# Patient Record
Sex: Male | Born: 1955 | Race: Black or African American | Hispanic: No | Smoking: Former smoker
Health system: Southern US, Community
[De-identification: ages and names within clinical notes are randomized; demographics above are authoritative.]

## PROBLEM LIST (undated history)

## (undated) DIAGNOSIS — I452 Bifascicular block: Secondary | ICD-10-CM

## (undated) DIAGNOSIS — I872 Venous insufficiency (chronic) (peripheral): Secondary | ICD-10-CM

## (undated) DIAGNOSIS — E876 Hypokalemia: Secondary | ICD-10-CM

## (undated) DIAGNOSIS — I10 Essential (primary) hypertension: Secondary | ICD-10-CM

## (undated) DIAGNOSIS — Z9989 Dependence on other enabling machines and devices: Secondary | ICD-10-CM

## (undated) DIAGNOSIS — E669 Obesity, unspecified: Secondary | ICD-10-CM

## (undated) DIAGNOSIS — E785 Hyperlipidemia, unspecified: Secondary | ICD-10-CM

## (undated) DIAGNOSIS — I319 Disease of pericardium, unspecified: Secondary | ICD-10-CM

## (undated) DIAGNOSIS — M21619 Bunion of unspecified foot: Secondary | ICD-10-CM

## (undated) DIAGNOSIS — G4733 Obstructive sleep apnea (adult) (pediatric): Secondary | ICD-10-CM

## (undated) DIAGNOSIS — Z87448 Personal history of other diseases of urinary system: Secondary | ICD-10-CM

## (undated) DIAGNOSIS — N289 Disorder of kidney and ureter, unspecified: Secondary | ICD-10-CM

## (undated) DIAGNOSIS — Z720 Tobacco use: Secondary | ICD-10-CM

## (undated) DIAGNOSIS — T783XXA Angioneurotic edema, initial encounter: Secondary | ICD-10-CM

## (undated) HISTORY — DX: Dependence on other enabling machines and devices: Z99.89

## (undated) HISTORY — DX: Disorder of kidney and ureter, unspecified: N28.9

## (undated) HISTORY — DX: Angioneurotic edema, initial encounter: T78.3XXA

## (undated) HISTORY — DX: Bifascicular block: I45.2

## (undated) HISTORY — DX: Venous insufficiency (chronic) (peripheral): I87.2

## (undated) HISTORY — DX: Hypokalemia: E87.6

## (undated) HISTORY — DX: Essential (primary) hypertension: I10

## (undated) HISTORY — DX: Obesity, unspecified: E66.9

## (undated) HISTORY — DX: Hyperlipidemia, unspecified: E78.5

## (undated) HISTORY — DX: Personal history of other diseases of urinary system: Z87.448

## (undated) HISTORY — DX: Tobacco use: Z72.0

## (undated) HISTORY — DX: Bunion of unspecified foot: M21.619

## (undated) HISTORY — DX: Obstructive sleep apnea (adult) (pediatric): G47.33

## (undated) HISTORY — DX: Disease of pericardium, unspecified: I31.9

---

## 1986-04-30 DIAGNOSIS — I319 Disease of pericardium, unspecified: Secondary | ICD-10-CM

## 1986-04-30 HISTORY — DX: Disease of pericardium, unspecified: I31.9

## 1986-04-30 HISTORY — PX: OTHER SURGICAL HISTORY: SHX169

## 2003-06-07 ENCOUNTER — Encounter: Admission: RE | Admit: 2003-06-07 | Discharge: 2003-06-07 | Payer: Self-pay | Admitting: Internal Medicine

## 2003-06-22 ENCOUNTER — Encounter (INDEPENDENT_AMBULATORY_CARE_PROVIDER_SITE_OTHER): Payer: Self-pay | Admitting: Cardiology

## 2003-06-22 ENCOUNTER — Ambulatory Visit: Admission: RE | Admit: 2003-06-22 | Discharge: 2003-06-22 | Payer: Self-pay | Admitting: Internal Medicine

## 2003-07-06 ENCOUNTER — Encounter: Admission: RE | Admit: 2003-07-06 | Discharge: 2003-07-06 | Payer: Self-pay | Admitting: Internal Medicine

## 2003-07-11 ENCOUNTER — Ambulatory Visit (HOSPITAL_BASED_OUTPATIENT_CLINIC_OR_DEPARTMENT_OTHER): Admission: RE | Admit: 2003-07-11 | Discharge: 2003-07-11 | Payer: Self-pay | Admitting: Internal Medicine

## 2003-07-27 ENCOUNTER — Ambulatory Visit (HOSPITAL_BASED_OUTPATIENT_CLINIC_OR_DEPARTMENT_OTHER): Admission: RE | Admit: 2003-07-27 | Discharge: 2003-07-27 | Payer: Self-pay | Admitting: Internal Medicine

## 2003-08-13 ENCOUNTER — Encounter: Admission: RE | Admit: 2003-08-13 | Discharge: 2003-08-13 | Payer: Self-pay | Admitting: Internal Medicine

## 2003-08-16 ENCOUNTER — Encounter: Admission: RE | Admit: 2003-08-16 | Discharge: 2003-08-16 | Payer: Self-pay | Admitting: Internal Medicine

## 2003-08-18 ENCOUNTER — Encounter (INDEPENDENT_AMBULATORY_CARE_PROVIDER_SITE_OTHER): Payer: Self-pay | Admitting: Internal Medicine

## 2003-08-18 LAB — CONVERTED CEMR LAB: Microalbumin U total vol: NEGATIVE mg/L

## 2003-08-19 ENCOUNTER — Ambulatory Visit (HOSPITAL_COMMUNITY): Admission: RE | Admit: 2003-08-19 | Discharge: 2003-08-19 | Payer: Self-pay | Admitting: Internal Medicine

## 2003-08-19 ENCOUNTER — Encounter: Admission: RE | Admit: 2003-08-19 | Discharge: 2003-08-19 | Payer: Self-pay | Admitting: Internal Medicine

## 2003-08-20 ENCOUNTER — Ambulatory Visit (HOSPITAL_COMMUNITY): Admission: RE | Admit: 2003-08-20 | Discharge: 2003-08-20 | Payer: Self-pay | Admitting: Internal Medicine

## 2003-08-26 ENCOUNTER — Encounter: Admission: RE | Admit: 2003-08-26 | Discharge: 2003-08-26 | Payer: Self-pay | Admitting: Internal Medicine

## 2003-12-31 ENCOUNTER — Ambulatory Visit: Payer: Self-pay | Admitting: Internal Medicine

## 2004-11-30 ENCOUNTER — Ambulatory Visit: Payer: Self-pay | Admitting: Internal Medicine

## 2005-01-03 ENCOUNTER — Ambulatory Visit: Payer: Self-pay | Admitting: Internal Medicine

## 2005-01-05 ENCOUNTER — Ambulatory Visit: Payer: Self-pay | Admitting: Internal Medicine

## 2005-05-28 ENCOUNTER — Ambulatory Visit: Payer: Self-pay | Admitting: Internal Medicine

## 2005-05-28 ENCOUNTER — Encounter (INDEPENDENT_AMBULATORY_CARE_PROVIDER_SITE_OTHER): Payer: Self-pay | Admitting: Internal Medicine

## 2005-06-25 ENCOUNTER — Ambulatory Visit: Payer: Self-pay | Admitting: Internal Medicine

## 2005-09-27 ENCOUNTER — Ambulatory Visit: Payer: Self-pay | Admitting: Internal Medicine

## 2006-02-22 ENCOUNTER — Encounter (INDEPENDENT_AMBULATORY_CARE_PROVIDER_SITE_OTHER): Payer: Self-pay | Admitting: Internal Medicine

## 2006-02-22 DIAGNOSIS — G4733 Obstructive sleep apnea (adult) (pediatric): Secondary | ICD-10-CM | POA: Insufficient documentation

## 2006-02-22 DIAGNOSIS — I152 Hypertension secondary to endocrine disorders: Secondary | ICD-10-CM | POA: Insufficient documentation

## 2006-02-22 DIAGNOSIS — E1169 Type 2 diabetes mellitus with other specified complication: Secondary | ICD-10-CM | POA: Insufficient documentation

## 2006-02-22 DIAGNOSIS — E1159 Type 2 diabetes mellitus with other circulatory complications: Secondary | ICD-10-CM | POA: Insufficient documentation

## 2006-02-22 DIAGNOSIS — E1129 Type 2 diabetes mellitus with other diabetic kidney complication: Secondary | ICD-10-CM | POA: Insufficient documentation

## 2006-02-22 DIAGNOSIS — N183 Chronic kidney disease, stage 3 unspecified: Secondary | ICD-10-CM | POA: Insufficient documentation

## 2006-02-22 DIAGNOSIS — Z6841 Body Mass Index (BMI) 40.0 and over, adult: Secondary | ICD-10-CM

## 2006-02-22 DIAGNOSIS — M21612 Bunion of left foot: Secondary | ICD-10-CM | POA: Insufficient documentation

## 2006-02-22 DIAGNOSIS — E785 Hyperlipidemia, unspecified: Secondary | ICD-10-CM

## 2006-02-22 DIAGNOSIS — M21611 Bunion of right foot: Secondary | ICD-10-CM

## 2006-02-22 DIAGNOSIS — I4510 Unspecified right bundle-branch block: Secondary | ICD-10-CM | POA: Insufficient documentation

## 2006-02-22 DIAGNOSIS — Z87891 Personal history of nicotine dependence: Secondary | ICD-10-CM | POA: Insufficient documentation

## 2006-02-22 DIAGNOSIS — I319 Disease of pericardium, unspecified: Secondary | ICD-10-CM | POA: Insufficient documentation

## 2006-02-22 DIAGNOSIS — I10 Essential (primary) hypertension: Secondary | ICD-10-CM

## 2006-06-10 ENCOUNTER — Telehealth (INDEPENDENT_AMBULATORY_CARE_PROVIDER_SITE_OTHER): Payer: Self-pay | Admitting: Hospitalist

## 2006-06-11 ENCOUNTER — Encounter (INDEPENDENT_AMBULATORY_CARE_PROVIDER_SITE_OTHER): Payer: Self-pay | Admitting: *Deleted

## 2006-12-11 ENCOUNTER — Encounter (INDEPENDENT_AMBULATORY_CARE_PROVIDER_SITE_OTHER): Payer: Self-pay | Admitting: *Deleted

## 2006-12-11 ENCOUNTER — Ambulatory Visit: Payer: Self-pay | Admitting: Infectious Disease

## 2006-12-11 LAB — CONVERTED CEMR LAB: Hgb A1c MFr Bld: 5.9 %

## 2006-12-12 LAB — CONVERTED CEMR LAB
AST: 13 units/L (ref 0–37)
Albumin: 4.3 g/dL (ref 3.5–5.2)
Alkaline Phosphatase: 51 units/L (ref 39–117)
BUN: 17 mg/dL (ref 6–23)
Creatinine, Ser: 1.26 mg/dL (ref 0.40–1.50)
Glucose, Bld: 82 mg/dL (ref 70–99)
HDL: 42 mg/dL (ref >39)
LDL Cholesterol: 130 mg/dL — ABNORMAL HIGH (ref 0–99)
Potassium: 4.2 meq/L (ref 3.5–5.3)
Total Bilirubin: 0.6 mg/dL (ref 0.3–1.2)
Total CHOL/HDL Ratio: 5.3
Triglycerides: 247 mg/dL — ABNORMAL HIGH (ref <150)
VLDL: 49 mg/dL — ABNORMAL HIGH (ref 0–40)

## 2006-12-16 ENCOUNTER — Ambulatory Visit: Payer: Self-pay | Admitting: *Deleted

## 2006-12-17 ENCOUNTER — Encounter (INDEPENDENT_AMBULATORY_CARE_PROVIDER_SITE_OTHER): Payer: Self-pay | Admitting: *Deleted

## 2006-12-17 LAB — CONVERTED CEMR LAB
CO2: 26 meq/L (ref 19–32)
Glucose, Bld: 79 mg/dL (ref 70–99)
Potassium: 4 meq/L (ref 3.5–5.3)
Sodium: 142 meq/L (ref 135–145)

## 2007-08-11 ENCOUNTER — Telehealth: Payer: Self-pay | Admitting: *Deleted

## 2007-08-25 ENCOUNTER — Telehealth (INDEPENDENT_AMBULATORY_CARE_PROVIDER_SITE_OTHER): Payer: Self-pay | Admitting: *Deleted

## 2007-09-23 ENCOUNTER — Telehealth (INDEPENDENT_AMBULATORY_CARE_PROVIDER_SITE_OTHER): Payer: Self-pay | Admitting: *Deleted

## 2007-09-24 ENCOUNTER — Encounter (INDEPENDENT_AMBULATORY_CARE_PROVIDER_SITE_OTHER): Payer: Self-pay | Admitting: Internal Medicine

## 2007-09-24 ENCOUNTER — Ambulatory Visit: Payer: Self-pay | Admitting: Internal Medicine

## 2007-09-24 LAB — CONVERTED CEMR LAB
ALT: 13 units/L (ref 0–53)
CO2: 26 meq/L (ref 19–32)
Calcium: 10 mg/dL (ref 8.4–10.5)
Chloride: 105 meq/L (ref 96–112)
Creatinine, Ser: 1.58 mg/dL — ABNORMAL HIGH (ref 0.40–1.50)
HCT: 45.2 % (ref 39.0–52.0)
Hemoglobin: 15 g/dL (ref 13.0–17.0)
Hgb A1c MFr Bld: 6.1 %
Microalb Creat Ratio: 16.9 mg/g (ref 0.0–30.0)
Platelets: 206 10*3/uL (ref 150–400)
WBC: 7.2 10*3/uL (ref 4.0–10.5)

## 2007-10-07 ENCOUNTER — Ambulatory Visit: Payer: Self-pay | Admitting: Internal Medicine

## 2007-10-07 ENCOUNTER — Encounter (INDEPENDENT_AMBULATORY_CARE_PROVIDER_SITE_OTHER): Payer: Self-pay | Admitting: *Deleted

## 2007-10-07 LAB — CONVERTED CEMR LAB: Blood Glucose, Fingerstick: 95

## 2007-10-08 ENCOUNTER — Telehealth (INDEPENDENT_AMBULATORY_CARE_PROVIDER_SITE_OTHER): Payer: Self-pay | Admitting: *Deleted

## 2007-10-08 ENCOUNTER — Encounter (INDEPENDENT_AMBULATORY_CARE_PROVIDER_SITE_OTHER): Payer: Self-pay | Admitting: *Deleted

## 2007-10-08 LAB — CONVERTED CEMR LAB
AST: 13 units/L (ref 0–37)
Albumin: 4.1 g/dL (ref 3.5–5.2)
Alkaline Phosphatase: 45 units/L (ref 39–117)
BUN: 16 mg/dL (ref 6–23)
Creatinine, Ser: 1.38 mg/dL (ref 0.40–1.50)
HDL: 35 mg/dL — ABNORMAL LOW (ref >39)
LDL Cholesterol: 113 mg/dL — ABNORMAL HIGH (ref 0–99)
Potassium: 4.1 meq/L (ref 3.5–5.3)
Total Bilirubin: 0.5 mg/dL (ref 0.3–1.2)
Total CHOL/HDL Ratio: 5.2
VLDL: 33 mg/dL (ref 0–40)

## 2007-11-20 ENCOUNTER — Encounter (INDEPENDENT_AMBULATORY_CARE_PROVIDER_SITE_OTHER): Payer: Self-pay | Admitting: Internal Medicine

## 2007-11-20 ENCOUNTER — Ambulatory Visit: Payer: Self-pay | Admitting: Internal Medicine

## 2007-11-20 DIAGNOSIS — Z87448 Personal history of other diseases of urinary system: Secondary | ICD-10-CM

## 2007-11-20 HISTORY — DX: Personal history of other diseases of urinary system: Z87.448

## 2007-11-20 LAB — CONVERTED CEMR LAB
Alkaline Phosphatase: 50 units/L (ref 39–117)
Basophils Absolute: 0 10*3/uL (ref 0.0–0.1)
Basophils Relative: 0 % (ref 0–1)
Bilirubin Urine: NEGATIVE
Creatinine, Ser: 1.44 mg/dL (ref 0.40–1.50)
Glucose, Bld: 86 mg/dL (ref 70–99)
Leukocytes, UA: NEGATIVE
Lymphocytes Relative: 23 % (ref 12–46)
MCHC: 32.2 g/dL (ref 30.0–36.0)
Neutro Abs: 5.3 10*3/uL (ref 1.7–7.7)
Nitrite: NEGATIVE
Platelets: 193 10*3/uL (ref 150–400)
Protein, U semiquant: NEGATIVE
Protein, ur: NEGATIVE mg/dL
Prothrombin Time: 12.4 s (ref 11.6–15.2)
RDW: 14.4 % (ref 11.5–15.5)
Sodium: 141 meq/L (ref 135–145)
Total Bilirubin: 0.8 mg/dL (ref 0.3–1.2)
Total Protein: 6.7 g/dL (ref 6.0–8.3)
Urine Glucose: NEGATIVE mg/dL
Urobilinogen, UA: 0.2
WBC Urine, dipstick: NEGATIVE
aPTT: 30 s (ref 24–37)
pH: 7.5
pH: 7.5 (ref 5.0–8.0)

## 2007-11-21 ENCOUNTER — Ambulatory Visit (HOSPITAL_COMMUNITY): Admission: RE | Admit: 2007-11-21 | Discharge: 2007-11-21 | Payer: Self-pay | Admitting: Internal Medicine

## 2007-11-24 ENCOUNTER — Ambulatory Visit: Payer: Self-pay | Admitting: Internal Medicine

## 2008-10-18 ENCOUNTER — Telehealth (INDEPENDENT_AMBULATORY_CARE_PROVIDER_SITE_OTHER): Payer: Self-pay | Admitting: *Deleted

## 2008-12-01 ENCOUNTER — Ambulatory Visit: Payer: Self-pay | Admitting: Internal Medicine

## 2008-12-01 ENCOUNTER — Encounter: Payer: Self-pay | Admitting: Internal Medicine

## 2008-12-01 DIAGNOSIS — J309 Allergic rhinitis, unspecified: Secondary | ICD-10-CM | POA: Insufficient documentation

## 2008-12-01 LAB — CONVERTED CEMR LAB: Hgb A1c MFr Bld: 6.1 %

## 2008-12-02 ENCOUNTER — Encounter: Payer: Self-pay | Admitting: Internal Medicine

## 2008-12-02 LAB — CONVERTED CEMR LAB
ALT: 12 units/L (ref 0–53)
Bilirubin, Direct: 0.1 mg/dL (ref 0.0–0.3)
Cholesterol: 186 mg/dL (ref 0–200)
Indirect Bilirubin: 0.4 mg/dL (ref 0.0–0.9)
LDL Cholesterol: 119 mg/dL — ABNORMAL HIGH (ref 0–99)
Total Bilirubin: 0.5 mg/dL (ref 0.3–1.2)
Total CHOL/HDL Ratio: 5.3
VLDL: 32 mg/dL (ref 0–40)

## 2009-06-29 ENCOUNTER — Observation Stay (HOSPITAL_COMMUNITY): Admission: EM | Admit: 2009-06-29 | Discharge: 2009-06-29 | Payer: Self-pay | Admitting: Emergency Medicine

## 2009-06-29 ENCOUNTER — Telehealth (INDEPENDENT_AMBULATORY_CARE_PROVIDER_SITE_OTHER): Payer: Self-pay | Admitting: Internal Medicine

## 2009-06-30 ENCOUNTER — Ambulatory Visit: Payer: Self-pay | Admitting: Internal Medicine

## 2009-06-30 DIAGNOSIS — E876 Hypokalemia: Secondary | ICD-10-CM | POA: Insufficient documentation

## 2009-06-30 DIAGNOSIS — T783XXA Angioneurotic edema, initial encounter: Secondary | ICD-10-CM | POA: Insufficient documentation

## 2009-06-30 LAB — CONVERTED CEMR LAB
Blood Glucose, Fingerstick: 118
Hgb A1c MFr Bld: 6.6 %

## 2010-01-16 ENCOUNTER — Telehealth: Payer: Self-pay | Admitting: Internal Medicine

## 2010-01-27 ENCOUNTER — Encounter (INDEPENDENT_AMBULATORY_CARE_PROVIDER_SITE_OTHER): Payer: Self-pay | Admitting: *Deleted

## 2010-02-15 ENCOUNTER — Telehealth: Payer: Self-pay | Admitting: Internal Medicine

## 2010-02-21 ENCOUNTER — Telehealth: Payer: Self-pay | Admitting: *Deleted

## 2010-04-03 ENCOUNTER — Telehealth: Payer: Self-pay | Admitting: Internal Medicine

## 2010-04-04 ENCOUNTER — Ambulatory Visit: Payer: Self-pay | Admitting: Internal Medicine

## 2010-04-04 DIAGNOSIS — I872 Venous insufficiency (chronic) (peripheral): Secondary | ICD-10-CM | POA: Insufficient documentation

## 2010-04-04 LAB — CONVERTED CEMR LAB
AST: 12 units/L (ref 0–37)
BUN: 16 mg/dL (ref 6–23)
Bilirubin, Direct: 0.1 mg/dL (ref 0.0–0.3)
Blood Glucose, Fingerstick: 110
CO2: 30 meq/L (ref 19–32)
Calcium: 9.5 mg/dL (ref 8.4–10.5)
Cholesterol: 187 mg/dL (ref 0–200)
Glucose, Bld: 100 mg/dL — ABNORMAL HIGH (ref 70–99)
Hgb A1c MFr Bld: 6.2 %
Indirect Bilirubin: 0.4 mg/dL (ref 0.0–0.9)
Microalb, Ur: 2.18 mg/dL — ABNORMAL HIGH (ref 0.00–1.89)
Sodium: 141 meq/L (ref 135–145)
Total Bilirubin: 0.5 mg/dL (ref 0.3–1.2)
Total CHOL/HDL Ratio: 5.5

## 2010-04-10 ENCOUNTER — Ambulatory Visit: Payer: Self-pay | Admitting: Internal Medicine

## 2010-04-10 LAB — CONVERTED CEMR LAB
OCCULT 1: NEGATIVE
OCCULT 2: NEGATIVE

## 2010-04-10 LAB — FECAL OCCULT BLOOD, GUAIAC: Fecal Occult Blood: NEGATIVE

## 2010-05-30 NOTE — Assessment & Plan Note (Signed)
Summary: EST-CK/FU/MEDS/CFB   Vital Signs:  Patient profile:   55 year old male Height:      72 inches (182.88 cm) Weight:      342.3 pounds (155.59 kg) BMI:     46.59 Temp:     98.5 degrees F oral Pulse rate:   76 / minute BP sitting:   125 / 83  (right arm) Cuff size:   large  Vitals Entered By: Chinita Pester RN (April 04, 2010 10:47 AM) CC: Check-up; med refills. Flu shot. Is Patient Diabetic? Yes Did you bring your meter with you today? No Pain Assessment Patient in pain? no      Nutritional Status BMI of > 30 = obese CBG Result 110  Have you ever been in a relationship where you felt threatened, hurt or afraid?No   Does patient need assistance? Functional Status Self care Ambulation Normal   Diabetic Foot Exam Foot Inspection Is there a history of a foot ulcer?              No Is there a foot ulcer now?              No Is there swelling or an abnormal foot shape?          No Are the toenails long?                No Are the toenails thick?                No Are the toenails ingrown?              No Is there heavy callous build-up?              No Is there pain in the calf muscle (Intermittent claudication) when walking?    NoIs there a claw toe deformity?              No Is there elevated skin temperature?            No Is there limited ankle dorsiflexion?            No Is there foot or ankle muscle weakness?            No  Diabetic Foot Care Education Pulse Check          Right Foot          Left Foot Posterior Tibial:        normal            normal Dorsalis Pedis:        normal            normal  High Risk Feet? No   10-g (5.07) Semmes-Weinstein Monofilament Test           Right Foot          Left Foot Visual Inspection               Test Control      normal         normal Site 1         normal         normal Site 2         normal         normal Site 3         normal         normal Site 4         normal  normal Site 5         normal          normal Site 6         normal         normal Site 7         normal         normal Site 8         normal         normal Site 9         normal         normal Site 10         normal         normal  Impression      normal         normal   Primary Care Provider:  Olene Craven MD  CC:  Check-up; med refills. Flu shot..  History of Present Illness: Patient is here for a follow up appointment: 1. Meds refills 2. DM --works as a Freight forwarder; eats on the road, no exercise. Denies any hypoglycemic events. Did not bring his glucometer with him. 3. Preventative care: did not f/u with colonoscopy due to lack of insurance.  Depression History:      The patient denies a depressed mood most of the day and a diminished interest in his usual daily activities.         Preventive Screening-Counseling & Management  Alcohol-Tobacco     Alcohol drinks/day: 1-2x a year     Smoking Status: current     Smoking Cessation Counseling: yes     Packs/Day: 1     Year Started: AT THE AGE OF 16     Passive Smoke Exposure: no  Caffeine-Diet-Exercise     Caffeine use/day: 2     Does Patient Exercise: no  Current Problems (verified): 1)  Unspecified Venous Insufficiency  (ICD-459.81) 2)  Hypokalemia  (ICD-276.8) 3)  Angioedema  (ICD-995.1) 4)  Allergic Rhinitis  (ICD-477.9) 5)  Hematuria, Hx of  (ICD-V13.09) 6)  Diabetes Mellitus, Type II  (ICD-250.00) 7)  Bunions, Bilateral  (ICD-727.1) 8)  Rbbb  (ICD-426.4) 9)  Smoker  (ICD-305.1) 10)  Sleep Apnea, Obstructive  (ICD-327.23) 11)  Obesity Nos  (ICD-278.00) 12)  Hx of Pericarditis  (ICD-423.9) 13)  Renal Insufficiency  (ICD-588.9) 14)  Hypertension  (ICD-401.9) 15)  Hyperlipidemia  (ICD-272.4)  Medications Prior to Update: 1)  Dyazide 37.5-25 Mg Caps (Triamterene-Hctz) .... Take 1 Tablet By Mouth Once A Day 2)  Aspirin 81 Mg Tbec (Aspirin) .... Take 1 Tablet By Mouth Once A Day 3)  Glucophage 500 Mg Tabs (Metformin Hcl) ....  Take 1 Tablet By Mouth Two Times A Day 4)  Pravachol 40 Mg Tabs (Pravastatin Sodium) .... Take 2  Tablets By Mouth Once A Day 5)  Atenolol 50 Mg  Tabs (Atenolol) .... Take 1 Tablet By Mouth 2x A Day 6)  Amlodipine Besylate 10 Mg  Tabs (Amlodipine Besylate) .... Take 1 Tablet By Mouth Once A Day 7)  Nasonex 50 Mcg/act Susp (Mometasone Furoate) .... 2 Sprays Inside Each Nostril Daily. 8)  Klor-Con 10 10 Meq Cr-Tabs (Potassium Chloride) .... Take Two Tab Twice A Day For Three Days and Then Take One Tab Daily 9)  Onetouch Ultra Test  Strp (Glucose Blood) .... Please Use It As Prescribed To Check Your Blood Sugar At Least Once A Day (Code 250.00)  Current Medications (verified): 1)  Dyazide 37.5-25 Mg Caps (Triamterene-Hctz) .Marland KitchenMarland KitchenMarland Kitchen  Take 1 Tablet By Mouth Once A Day 2)  Aspirin 81 Mg Tbec (Aspirin) .... Take 1 Tablet By Mouth Once A Day 3)  Glucophage 500 Mg Tabs (Metformin Hcl) .... Take 1 Tablet By Mouth Two Times A Day 4)  Pravachol 40 Mg Tabs (Pravastatin Sodium) .... Take 2  Tablets By Mouth Once A Day 5)  Atenolol 50 Mg  Tabs (Atenolol) .... Take 1 Tablet By Mouth 2x A Day 6)  Amlodipine Besylate 10 Mg  Tabs (Amlodipine Besylate) .... Take 1 Tablet By Mouth Once A Day 7)  Klor-Con 10 10 Meq Cr-Tabs (Potassium Chloride) .... Take Two Tab Twice A Day For Three Days and Then Take One Tab Daily 8)  Onetouch Ultra Test  Strp (Glucose Blood) .... Please Use It As Prescribed To Check Your Blood Sugar At Least Once A Day (Code 250.00) 9)  V-5 High Compression Hose  Misc (Elastic Bandages & Supports) .... Wear While Driving, Walking  Allergies (verified): 1)  ! Ace Inhibitors  Past History:  Past medical, surgical, family and social histories (including risk factors) reviewed, and no changes noted (except as noted below).  Past Medical History: Reviewed history from 10/07/2007 and no changes required. Right bundle branch block Pericarditis ( Pericardiocentesis)-1988 [place] DIABETES MELLITUS TYPE II     - NIDDM HTN, Stage II    - no microalbuminuria (5/09) HYPERLIPIDEMIA ABNORMAL ECG    - s/p 2D echo (06/2003): 55-65% EF, mild asymmetric septal hypertrophy, RV and LV wall thickness at upper limits of normal TOBACCO ABUSE OBESITY  Family History: Reviewed history and no changes required.  Social History: Reviewed history from 10/07/2007 and no changes required. Occupation: truck Hospital doctor.   Review of Systems  The patient denies anorexia, fever, weight loss, weight gain, vision loss, decreased hearing, hoarseness, chest pain, syncope, dyspnea on exertion, peripheral edema, prolonged cough, headaches, hemoptysis, abdominal pain, melena, hematochezia, severe indigestion/heartburn, hematuria, incontinence, genital sores, muscle weakness, suspicious skin lesions, transient blindness, difficulty walking, depression, unusual weight change, abnormal bleeding, enlarged lymph nodes, angioedema, breast masses, and testicular masses.    Physical Exam  General:  Obesealert, well-developed, well-nourished, and well-hydrated.   Head:  Normocephalic and atraumatic without obvious abnormalities. No apparent alopecia or balding. Eyes:  No corneal or conjunctival inflammation noted. EOMI. Perrla. Funduscopic exam benign, without hemorrhages, exudates or papilledema. Vision grossly normal. Ears:  External ear exam shows no significant lesions or deformities.  Otoscopic examination reveals clear canals, tympanic membranes are intact bilaterally without bulging, retraction, inflammation or discharge. Hearing is grossly normal bilaterally. Mouth:  Oral mucosa and oropharynx without lesions or exudates.  Teeth in good repair. Neck:  No deformities, masses, or tenderness noted. Lungs:  Normal respiratory effort, chest expands symmetrically. Lungs are clear to auscultation, no crackles or wheezes. Heart:  Normal rate and regular rhythm. S1 and S2 normal without gallop, murmur, click, rub or other extra  sounds. Abdomen:  Bowel sounds positive,abdomen soft and non-tender without masses, organomegaly or hernias noted. Rectal:  refused Genitalia:  refused Prostate:  refused Msk:  No deformity or scoliosis noted of thoracic or lumbar spine.   Pulses:  R and L carotid,radial,femoral,dorsalis pedis and posterior tibial pulses are full and equal bilaterally Extremities:  trace left pedal edema and trace right pedal edema.  trace left pedal edema and trace right pedal edema.   Neurologic:  No cranial nerve deficits noted. Station and gait are normal. Plantar reflexes are down-going bilaterally. DTRs are symmetrical throughout. Sensory, motor and coordinative functions  appear intact. Skin:  lfoot plantar surface  skin colored papular lesion --patient had had it since 1970's due to foot trauma. No changes in apperance, per patient. Cervical Nodes:  No lymphadenopathy noted Psych:  Cognition and judgment appear intact. Alert and cooperative with normal attention span and concentration. No apparent delusions, illusions, hallucinations  Diabetes Management Exam:    Foot Exam (with socks and/or shoes not present):       Sensory-Monofilament:          Left foot: normal          Right foot: normal   Impression & Recommendations:  Problem # 1:  DIABETES MELLITUS, TYPE II (ICD-250.00) Assessment Unchanged controlled. WEight managment discussed with the patient. His updated medication list for this problem includes:    Aspirin 81 Mg Tbec (Aspirin) .Marland Kitchen... Take 1 tablet by mouth once a day    Glucophage 500 Mg Tabs (Metformin hcl) .Marland Kitchen... Take 1 tablet by mouth two times a day  Orders: T- Capillary Blood Glucose (91478) T-Hgb A1C (in-house) (29562ZH) Ophthalmology Referral (Ophthalmology) T-Urine Microalbumin w/creat. ratio [phone])  Labs Reviewed: Creat: 1.44 (11/20/2007)   Microalbumin: Neg (08/18/2003)  Last Eye Exam: No diabetic retinopathy.   Visual acuity OD (best corrected): 20/20      Visual acuity OS (best corrected): 20/20  (10/08/2007) Reviewed HgBA1c results: 6.2 (04/04/2010)  6.6 (06/30/2009)  Problem # 2:  HYPERTENSION (ICD-401.9) Assessment: Unchanged Controlled. His updated medication list for this problem includes:    Dyazide 37.5-25 Mg Caps (Triamterene-hctz) .Marland Kitchen... Take 1 tablet by mouth once a day    Atenolol 50 Mg Tabs (Atenolol) .Marland Kitchen... Take 1 tablet by mouth 2x a day    Amlodipine Besylate 10 Mg Tabs (Amlodipine besylate) .Marland Kitchen... Take 1 tablet by mouth once a day  BP today: 125/83 Prior BP: 136/88 (06/30/2009)  Prior 10 Yr Risk Heart Disease: Not enough information (10/07/2007)  Labs Reviewed: K+: 4.2 (11/20/2007) Creat: : 1.44 (11/20/2007)   Chol: 186 (12/02/2008)   HDL: 35 (12/02/2008)   LDL: 119 (12/02/2008)   TG: 160 (12/02/2008)  Orders: T-Basic Metabolic Panel [id number])  Problem # 3:  SMOKER (ICD-305.1) Assessment: Unchanged  Encouraged smoking cessation and discussed different methods for smoking cessation.   Problem # 4:  SLEEP APNEA, OBSTRUCTIVE (ICD-327.23) Assessment: Unchanged Strongly advised to adhere with C-PAP regimen. Risks of lack of adherance discussed. Patient verbalized understanding.  Problem # 5:  UNSPECIFIED VENOUS INSUFFICIENCY (ICD-459.81) Assessment: New Instructed to decrease salt intake; eleavate LE's above heart level while supine or being seated. Compression stockings.  Medications Added to Medication List This Visit: 1)  V-5 High Compression Hose Misc (Elastic bandages & supports) .... Wear while driving, walking  Complete Medication List: 1)  Dyazide 37.5-25 Mg Caps (Triamterene-hctz) .... Take 1 tablet by mouth once a day 2)  Aspirin 81 Mg Tbec (Aspirin) .... Take 1 tablet by mouth once a day 3)  Glucophage 500 Mg Tabs (Metformin hcl) .... Take 1 tablet by mouth two times a day 4)  Pravachol 40 Mg Tabs (Pravastatin sodium) .... Take 2  tablets by mouth once a day 5)  Atenolol 50 Mg Tabs (Atenolol) ....  Take 1 tablet by mouth 2x a day 6)  Amlodipine Besylate 10 Mg Tabs (Amlodipine besylate) .... Take 1 tablet by mouth once a day 7)  Klor-con 10 10 Meq Cr-tabs (Potassium chloride) .... Take two tab twice a day for three days and then take one tab daily 8)  Onetouch Ultra Test Strp (Glucose  blood) .... Please use it as prescribed to check your blood sugar at least once a day (code 250.00) 9)  V-5 High Compression Hose Misc (Elastic bandages & supports) .... Wear while driving, walking  Other Orders: T-Hemoccult Card-Multiple (take home) ([address]) T-Lipid Profile ([id number]) T-Hepatic Function [phone])  Patient Instructions: 1)  Please, follow up with [place] in 6 months or sooner. 2)  Please, try to eact healthier. 3)  Please, use C-PAP u have a high risk of falling asleep and/or die while driving!!!!! 4)  Please, call with any questions Prescriptions: V-5 HIGH COMPRESSION HOSE  MISC (ELASTIC BANDAGES & SUPPORTS) Wear while driving, walking  #1 pair x 3   Entered and Authorized by:   Deatra Robinson MD   Signed by:   Deatra Robinson MD on 04/04/2010   Method used:   Electronically to        [facility] [address] [phone]* (retail)       [address]       [place], [place]  [address]       Ph: [phone]       Fax: [phone]   RxID:   [id number] ONETOUCH ULTRA TEST  STRP (GLUCOSE BLOOD) Please use it as prescribed to check your blood sugar at least once a day (Code 250.00)  #1 box x 11   Entered and Authorized by:   Deatra Robinson MD   Signed by:   Deatra Robinson MD on 04/04/2010   Method used:   Electronically to        [facility] [address] [phone]* (retail)       [address]       [place], [place]  [address]       Ph: [phone]       Fax: [phone]   RxID:   [id number] KLOR-CON 10 10 MEQ CR-TABS (POTASSIUM CHLORIDE) Take two tab twice a day for three days and then take one tab daily  #30 x 11   Entered and Authorized by:   Deatra Robinson MD   Signed by:    Deatra Robinson MD on 04/04/2010   Method used:   Electronically to        [facility] [phone]* (retail)       [address]       [place], [place]  [address]       Ph: [phone]       Fax: [phone]   RxID:   [id number] AMLODIPINE BESYLATE 10 MG  TABS (AMLODIPINE BESYLATE) Take 1 tablet by mouth once a day  #30 x 11   Entered and Authorized by:   Deatra Robinson MD   Signed by:   Deatra Robinson MD on 04/04/2010   Method used:   Electronically to        [facility] [phone]* (retail)       [address]       [place], [place]  [address]       Ph: [phone]       Fax: [phone]   RxID:   [id number] ATENOLOL 50 MG  TABS (ATENOLOL) Take 1 tablet by mouth 2x a day  #60 x 11   Entered and Authorized by:   Deatra Robinson MD   Signed by:   Deatra Robinson MD on 04/04/2010   Method used:   Electronically to        [facility] [phone]* (retail)       [address]  [place], [place]  [address]       Ph: [phone]       Fax: [phone]   RxID:   [id number] PRAVACHOL 40 MG TABS (PRAVASTATIN SODIUM) Take 2  tablets by mouth once a day  #60 x 11   Entered and Authorized by:   Deatra Robinson MD   Signed by:   Deatra Robinson MD on 04/04/2010   Method used:   Electronically to        [facility] [phone]* (retail)       [address]       [place], [place]  [address]       Ph: [phone]       Fax: [phone]   RxID:   [id number] GLUCOPHAGE 500 MG TABS (METFORMIN HCL) Take 1 tablet by mouth two times a day  #60 x 11   Entered and Authorized by:   Deatra Robinson MD   Signed by:   Deatra Robinson MD on 04/04/2010   Method used:   Electronically to        [facility] [phone]* (retail)       [address]       [place], [place]  [address]       Ph: [phone]       Fax: [phone]   RxID:   [id number] DYAZIDE 37.5-25 MG CAPS (TRIAMTERENE-HCTZ) Take 1 tablet by mouth once a day  #31 x 11   Entered and  Authorized by:   Deatra Robinson MD   Signed by:   Deatra Robinson MD on 04/04/2010   Method used:   Electronically to        [facility] [phone]* (retail)       [address]       [place], [place]  [address]       Ph: [phone]       Fax: [phone]   RxID:   [id number]    Orders Added: 1)  T- Capillary Blood Glucose [82948] 2)  T-Hgb A1C (in-house) [02585ID] 3)  T-Hemoccult Card-Multiple (take home) [82270] 4)  Ophthalmology Referral [Ophthalmology] 5)  Est. Patient Level IV [[address]] 6)  T-Urine Microalbumin w/creat. ratio [82043-[address]] 7)  T-Lipid Profile [80061-22930] 8)  T-Hepatic Function [80076-22960] 9)  T-Basic Metabolic Panel [id number]   Process Orders Check Orders Results:     [facility]: ABN not required for this insurance Tests Sent for requisitioning (April 04, 2010 11:09 AM):     04/04/2010: [facility] -- T-Urine Microalbumin w/creat. ratio [[id number]] (signed)     04/04/2010: [facility] -- T-Lipid Profile [phone] (signed)     04/04/2010: [facility] -- T-Hepatic Function [phone] (signed)     04/04/2010: [facility] -- T-Basic Metabolic Panel [phone] (signed)     Prevention & Chronic Care Immunizations   Influenza vaccine: Fluvax Non-MCR  (06/30/2009)   Influenza vaccine deferral: Deferred  (12/01/2008)   Influenza vaccine due: 12/30/2010    Tetanus booster: 12/01/2008: Td   Tetanus booster due: 12/02/2018    Pneumococcal vaccine: Not documented   Pneumococcal vaccine deferral: Deferred  (12/01/2008)  Colorectal Screening   Hemoccult: Not documented   Hemoccult action/deferral: Ordered  (04/04/2010)    Colonoscopy: Not documented   Colonoscopy action/deferral: GI referral  (04/04/2010)  Other Screening   PSA: Not documented   PSA action/deferral: Discussed-PSA declined  (04/04/2010)   Smoking status: current   (04/04/2010)   Smoking cessation counseling: yes  (04/04/2010)  Diabetes Mellitus   HgbA1C: 6.2  (04/04/2010)   Hemoglobin A1C due: 07/01/2010    Eye exam: No diabetic retinopathy.   Visual acuity OD (best corrected): 20/20     Visual acuity OS (best corrected): 20/20   (10/08/2007)   Diabetic eye exam action/deferral: Ophthalmology referral  (04/04/2010)   Eye exam due: 10/07/2008    Foot exam: yes  (04/04/2010)   Foot exam action/deferral: Do today   High risk foot: No  (04/04/2010)   Foot care education: Not documented    Urine microalbumin/creatinine ratio: 16.9  (09/24/2007)   Urine microalbumin action/deferral: Ordered   Urine microalbumin/cr due: 04/05/2011    Diabetes flowsheet reviewed?: Yes   Progress toward A1C goal: At goal    Stage of readiness to change (diabetes management): Maintenance  Lipids   Total Cholesterol: 186  (12/02/2008)   Lipid panel action/deferral: Lipid Panel ordered   LDL: 119  (12/02/2008)   LDL Direct: Not documented   HDL: 35  (12/02/2008)   Triglycerides: 160  (12/02/2008)   Lipid panel due: 04/05/2011    SGOT (AST): 12  (12/02/2008)   BMP action: Ordered   SGPT (ALT): 12  (12/02/2008)   Alkaline phosphatase: 54  (12/02/2008)   Total bilirubin: 0.5  (12/02/2008)   Liver panel due: 04/05/2011    Lipid flowsheet reviewed?: Yes   Progress toward LDL goal: Unchanged    Stage of readiness to change (lipid management): Action  Hypertension   Last Blood Pressure: 125 / 83  (04/04/2010)   Serum creatinine: 1.44  (11/20/2007)   BMP action: Ordered   Serum potassium 4.2  (11/20/2007)   Basic metabolic panel due: 04/05/2011    Hypertension flowsheet reviewed?: Yes   Progress toward BP goal: Unchanged    Stage of readiness to change (hypertension management): Action  Self-Management Support :   Personal Goals (by the next clinic visit) :     Personal A1C goal: 7  (06/30/2009)     Personal blood pressure goal: 130/80   (06/30/2009)     Personal LDL goal: 100  (06/30/2009)    Patient will work on the following items until the next clinic visit to reach self-care goals:     Medications and monitoring: take my medicines every day, bring all of my medications to every visit, examine my feet every day  (04/04/2010)     Eating: drink diet soda or water instead of juice or soda, eat more vegetables, use fresh or frozen vegetables, eat foods that are low in salt, eat baked foods instead of fried foods, eat fruit for snacks and desserts  (04/04/2010)     Activity: take a 30 minute walk every day  (04/04/2010)    Diabetes self-management support: Resources for patients handout  (06/30/2009)    Hypertension self-management support: Resources for patients handout  (06/30/2009)    Lipid self-management support: Resources for patients handout  (06/30/2009)    Nursing Instructions: Give Flu vaccine today Provide Hemoccult cards with instructions (see order) Refer for screening diabetic eye exam (see order) Diabetic foot exam today GI referral for screening colonoscopy (see order) Give Pneumovax today    Laboratory Results   Blood Tests   Date/Time Received: April 04, 2010 11:07 AM  Date/Time Reported: Burke Keels  April 04, 2010 11:07 AM   HGBA1C: 6.2%   (Normal Range: Non-Diabetic - 3-6%   Control Diabetic - 6-8%) CBG Random:: 110mg /dL      Appended Document: EST-CK/FU/MEDS/CFB   Pneumovax Vaccine  Vaccine Type: Pneumovax    Site: right deltoid    Mfr: Merck    Dose: 0.5 ml    Route: IM    Given by: Chinita Pester RN    Exp. Date: 08/25/2011    Lot #: [id number]    VIS given: 04/04/09 version given April 04, 2010.  Influenza Vaccine    Vaccine Type: Fluvax Non-MCR    Site: left deltoid    Mfr: GlaxoSmithKline    Dose: 0.5 ml    Route: IM    Given by: Chinita Pester RN    Exp. Date: 10/28/2010    Lot #: [id number]    VIS given: 11/22/09 version given April 04, 2010.  Flu Vaccine Consent Questions    Do you have a history of severe allergic reactions to this vaccine? no    Any prior history of allergic reactions to egg and/or gelatin? no    Do you have a sensitivity to the preservative Thimersol? no    Do you have a past history of Guillan-Barre Syndrome? no    Do you currently have an acute febrile illness? no    Have you ever had a severe reaction to latex? no    Vaccine information given and explained to patient? yes

## 2010-05-30 NOTE — Letter (Signed)
Summary: Device-Delinquent Check  [facility], Main Office  [address]   [place], [place] [address]   Phone: [phone]  Fax: [phone]     January 27, 2010 MRN: [id number]   Jeremiah Conway [address] [place], [place]  [address]   Dear Mr. Paulsen,  According to our records, you have not had your implanted device checked in the recommended period of time.  We are unable to determine appropriate device function without checking your device on a regular basis.  Please call our office to schedule an appointment as soon as possible.  If you are having your device checked by another physician, please call us so that we may update our records.  Thank you,  Letta Moynahan, EMT  January 27, 2010 10:28 AM  [facility]

## 2010-05-30 NOTE — Progress Notes (Signed)
  Pt came to ED last night with angioedema of tounge, possibly from ACEi. I was called by ED PA to get a f/u visit in a day or two. I called the clinic and got an appt for Jeremiah Conway tomorrow with Dr. Polly Cobia at 11 AM.

## 2010-05-30 NOTE — Progress Notes (Signed)
Summary: Refill/gh  Phone Note Refill Request Message from:  Fax from Pharmacy on April 03, 2010 11:39 AM  Refills Requested: Medication #1:  PRAVACHOL 40 MG TABS Take 2  tablets by mouth once a day   Last Refilled: 02/16/2010  Medication #2:  DYAZIDE 37.5-25 MG CAPS Take 1 tablet by mouth once a day   Last Refilled: 02/16/2010  Medication #3:  GLUCOPHAGE 500 MG TABS Take 1 tablet by mouth two times a day   Last Refilled: 02/16/2010  Method Requested: Electronic Initial call taken by: Angelina Ok RN,  April 03, 2010 11:40 AM  Follow-up for Phone Call        Refill approved-nurse to complete  Additional Follow-up for Phone Call Additional follow up Details #1::        Please make sure patient has an scheduled appointment as requested by Dr. Rogelia Boga; we do not have any labs recorde since 2009; she needs to be seen before having any further followups.   Thanks!!!!    Prescriptions: PRAVACHOL 40 MG TABS (PRAVASTATIN SODIUM) Take 2  tablets by mouth once a day  #60 x 1   Entered and Authorized by:   Vassie Loll MD   Signed by:   Vassie Loll MD on 04/03/2010   Method used:   Electronically to        [facility] [phone]* (retail)       [address]       [place], [place]  [address]       Ph: [phone]       Fax: [phone]   RxID:   [id number] GLUCOPHAGE 500 MG TABS (METFORMIN HCL) Take 1 tablet by mouth two times a day  #60 x 1   Entered and Authorized by:   Vassie Loll MD   Signed by:   Vassie Loll MD on 04/03/2010   Method used:   Electronically to        [facility] [phone]* (retail)       [address]       [place], [place]  [address]       Ph: [phone]       Fax: [phone]   RxID:   [id number] DYAZIDE 37.5-25 MG CAPS (TRIAMTERENE-HCTZ) Take 1 tablet by mouth once a day  #31 x 1   Entered and Authorized by:   Vassie Loll MD   Signed by:   Vassie Loll MD on 04/03/2010   Method used:   Electronically to   [facility] [phone]* (retail)       [address]       [place], [place]  [address]       Ph: [phone]       Fax: [phone]   RxID:   [id number]

## 2010-05-30 NOTE — Progress Notes (Signed)
Summary: Page #1 - med refil/gp  Phone Note Refill Request Message from:  Patient on February 15, 2010 1:30 PM  Refills Requested: Medication #1:  DYAZIDE 37.5-25 MG CAPS Take 1 tablet by mouth once a day  Medication #2:  KLOR-CON 10 10 MEQ CR-TABS Take two tab twice a day for three days and then take one tab daily  Medication #3:  GLUCOPHAGE 500 MG TABS Take 1 tablet by mouth two times a day  Medication #4:  PRAVACHOL 40 MG TABS Take 2  tablets by mouth once a day Last appt. November 16, 2009.     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   Method Requested: Electronic Initial call taken by: Chinita Pester RN,  February 15, 2010 1:31 PM  Follow-up for Phone Call        Patient was given 1 month refill and instruction to be seen for labs and followup before further refillsa re given. He has not be seen yet. I will give 1 month refill but he needs to be seen as soon as possible.    Prescriptions: KLOR-CON 10 10 MEQ CR-TABS (POTASSIUM CHLORIDE) Take two tab twice a day for three days and then take one tab daily  #30 x 1   Entered and Authorized by:   Vassie Loll MD   Signed by:   Vassie Loll MD on 02/16/2010   Method used:   Electronically to        [facility] [phone]* (retail)       [address]       [place], [place]  [address]       Ph: [phone]       Fax: [phone]   RxID:   [id number] PRAVACHOL 40 MG TABS (PRAVASTATIN SODIUM) Take 2  tablets by mouth once a day  #60 x 0   Entered and Authorized by:   Vassie Loll MD   Signed by:   Vassie Loll MD on 02/16/2010   Method used:   Electronically to        [facility] [phone]* (retail)       [address]       [place], [place]  [address]       Ph: [phone]       Fax: [phone]   RxID:   [id number] GLUCOPHAGE 500 MG TABS (METFORMIN HCL) Take 1 tablet by mouth two times a day  #60 x 0   Entered and Authorized by:   Vassie Loll MD   Signed by:   Vassie Loll MD on  02/16/2010   Method used:   Electronically to        [facility] [phone]* (retail)       [address]       [place], [place]  [address]       Ph: [phone]       Fax: [phone]   RxID:   [id number] DYAZIDE 37.5-25 MG CAPS (TRIAMTERENE-HCTZ) Take 1 tablet by mouth once a day  #31 x 0   Entered and Authorized by:   Vassie Loll MD   Signed by:   Vassie Loll MD on 02/16/2010   Method used:   Electronically to        [facility] [phone]* (retail)       [address]       [place], [place]  [address]       Ph: [phone]  Fax: [phone]   RxID:   [id number]

## 2010-05-30 NOTE — Progress Notes (Signed)
Summary: med refill/gp  Phone Note Refill Request Message from:  Patient on February 15, 2010 1:43 PM  Refills Requested: Medication #1:  ATENOLOL 50 MG  TABS Take 1 tablet by mouth 2x a day  Medication #2:  AMLODIPINE BESYLATE 10 MG  TABS Take 1 tablet by mouth once a day  Medication #3:    Method Requested: Electronic Initial call taken by: Chinita Pester RN,  February 15, 2010 1:44 PM  Follow-up for Phone Call        Last seen 3/11 for Hospital F/U and 1 month F/U requested. EMR has appt 6/11 but there is no office note. Regardless, I sent a flag to Ms Lissa Hoard to schedule an appt.  Follow-up by: Blanch Media MD,  February 16, 2010 12:18 PM    Prescriptions: AMLODIPINE BESYLATE 10 MG  TABS (AMLODIPINE BESYLATE) Take 1 tablet by mouth once a day  #30 x 1   Entered and Authorized by:   Blanch Media MD   Signed by:   Blanch Media MD on 02/16/2010   Method used:   Electronically to        [facility] [phone]* (retail)       [address]       [place], [place]  [address]       Ph: [phone]       Fax: [phone]   RxID:   [id number] ATENOLOL 50 MG  TABS (ATENOLOL) Take 1 tablet by mouth 2x a day  #60 x 1   Entered and Authorized by:   Blanch Media MD   Signed by:   Blanch Media MD on 02/16/2010   Method used:   Electronically to        [facility] [phone]* (retail)       [address]       [place], [place]  [address]       Ph: [phone]       Fax: [phone]   RxID:   [id number]

## 2010-05-30 NOTE — Assessment & Plan Note (Signed)
Summary: HFU/PER DR POKAHREL/CFB   Vital Signs:  Patient profile:   55 year old male Height:      72 inches (182.88 cm) BP supine:   136 / 88  Vitals Entered By: Theotis Barrio NT II (June 30, 2009 11:03 AM) CC: HOSPITAL FOLLOW UP APPT  / MEDICATIONS REFILL  /  MEDICAITON CHANGE DONE ED,  Is Patient Diabetic? Yes Did you bring your meter with you today? No Pain Assessment Patient in pain? no      Nutritional Status BMI of > 30 = obese CBG Result 118  Have you ever been in a relationship where you felt threatened, hurt or afraid?No   Does patient need assistance? Functional Status Self care Ambulation Normal Comments HOSPITAL FOLLOW UP APPT /  MEDICATIONS REFILL / MEDICATION CHANGE DONE BY ED   Primary Care Provider:  Olene Craven MD  CC:  HOSPITAL FOLLOW UP APPT  / MEDICATIONS REFILL  /  MEDICAITON CHANGE DONE ED and .  History of Present Illness: Jeremiah Conway is a 55 year old Male with PMH/problems as outlined in the EMR, who presents to the [facility] for follow up following a recent visit to the ED for tongue swelling. He was diagnosed as having angioedema secondary to benzepril and was sent home on prednisone. He did not take the prednisone, because he started to feel fine. His tongue swelling has susbsided today and doesn't have any new complaints. He is basically in here for review of his meds.   Depression History:      The patient denies a depressed mood most of the day and a diminished interest in his usual daily activities.         Preventive Screening-Counseling & Management  Alcohol-Tobacco     Smoking Status: current     Smoking Cessation Counseling: yes     Packs/Day: 1     Year Started: AT THE AGE OF 16     Passive Smoke Exposure: no  Caffeine-Diet-Exercise     Caffeine use/day: 2     Does Patient Exercise: no  Allergies (verified): 1)  ! Ace Inhibitors  Past History:  Past Medical History: Last updated: 10/07/2007 Right bundle branch  block Pericarditis ( Pericardiocentesis)-1988 [place] DIABETES MELLITUS TYPE II    - NIDDM HTN, Stage II    - no microalbuminuria (5/09) HYPERLIPIDEMIA ABNORMAL ECG    - s/p 2D echo (06/2003): 55-65% EF, mild asymmetric septal hypertrophy, RV and LV wall thickness at upper limits of normal TOBACCO ABUSE OBESITY  Social History: Last updated: 10/07/2007 Occupation: truck Hospital doctor.   Risk Factors: Caffeine Use: 2 (06/30/2009) Exercise: no (06/30/2009)  Risk Factors: Smoking Status: current (06/30/2009) Packs/Day: 1 (06/30/2009) Passive Smoke Exposure: no (06/30/2009)  Review of Systems       As per HPI.   Physical Exam  General:  alert and overweight-appearing.   Mouth:  tongue: no swelling pharynx pink and moist and no erythema.   Neck:  supple.   Lungs:  normal respiratory effort and no intercostal retractions.   Heart:  normal rate and regular rhythm.   Abdomen:  soft and non-tender.   Pulses:  normal peripheral pulses  Extremities:  no cyanosis, clubbing or edema  Neurologic:  non focal Psych:  normally interactive.     Impression & Recommendations:  Problem # 1:  ANGIOEDEMA (ICD-995.1) Resolved after d/c benazepril. I have listed ACEi as allergy. He should not be rechallenged with ACEi or ARB.   Problem # 2:  DIABETES MELLITUS, TYPE II (ICD-250.00) Data reviewed. A1c: 6.6 (06/30/2009 10:49:16 AM)  MICROALB/CR: 16.9 (09/24/2007 7:00:00 PM) LDL: 119 (12/02/2008 12:24:00 AM) EYE: No diabetic retinopathy.   Visual acuity OD (best corrected): 20/20     Visual acuity OS (best corrected): 20/20  (10/08/2007 8:20:55 AM) WEIGHT: 44.24 (12/01/2008 9:49:07 AM)   He will need a foot exam on next visit. No other changes today.   The following medications were removed from the medication list:    Benazepril Hcl 40 Mg Tabs (Benazepril hcl) .Marland Kitchen... Take 1 tablet by mouth once a day His updated medication list for this problem includes:    Aspirin 81 Mg Tbec (Aspirin) .Marland Kitchen...  Take 1 tablet by mouth once a day    Glucophage 500 Mg Tabs (Metformin hcl) .Marland Kitchen... Take 1 tablet by mouth two times a day  Orders: T- Capillary Blood Glucose (98119) T-Hgb A1C (in-house) (14782NF) Influenza Vaccine NON MCR ([address])  Problem # 3:  HYPERTENSION (ICD-401.9) Patient did not take his meds today, I will observe his BP without ACEi and review.  The following medications were removed from the medication list:    Benazepril Hcl 40 Mg Tabs (Benazepril hcl) .Marland Kitchen... Take 1 tablet by mouth once a day His updated medication list for this problem includes:    Dyazide 37.5-25 Mg Caps (Triamterene-hctz) .Marland Kitchen... Take 1 tablet by mouth once a day    Atenolol 50 Mg Tabs (Atenolol) .Marland Kitchen... Take 1 tablet by mouth 2x a day    Amlodipine Besylate 10 Mg Tabs (Amlodipine besylate) .Marland Kitchen... Take 1 tablet by mouth once a day  Problem # 4:  HYPOKALEMIA (ICD-276.8) K of 2.9 noted in ER yesterday. I will have him on K supplements.   Problem # 5:  HYPERLIPIDEMIA (ICD-272.4) Data reviewed. Chol: 186 (12/02/2008 12:24:00 AM)HDL:  35 (12/02/2008 12:24:00 AM)LDL:  119 (12/02/2008 12:24:00 AM)Tri:   AST:  12 (12/02/2008 12:24:00 AM) ALT:  12 (12/02/2008 12:24:00 AM)T. Bili:  0.5 (12/02/2008 12:24:00 AM) AP:  54 (12/02/2008 12:24:00 AM)  CONTINUE WITH THE CURRENT REGIMEN. HE WILL NEED A REPEAT FLP ON NEXT VISIT.  His updated medication list for this problem includes:    Pravachol 40 Mg Tabs (Pravastatin sodium) .Marland Kitchen... Take 2  tablets by mouth once a day  Complete Medication List: 1)  Dyazide 37.5-25 Mg Caps (Triamterene-hctz) .... Take 1 tablet by mouth once a day 2)  Aspirin 81 Mg Tbec (Aspirin) .... Take 1 tablet by mouth once a day 3)  Glucophage 500 Mg Tabs (Metformin hcl) .... Take 1 tablet by mouth two times a day 4)  Pravachol 40 Mg Tabs (Pravastatin sodium) .... Take 2  tablets by mouth once a day 5)  Atenolol 50 Mg Tabs (Atenolol) .... Take 1 tablet by mouth 2x a day 6)  Amlodipine Besylate 10 Mg Tabs  (Amlodipine besylate) .... Take 1 tablet by mouth once a day 7)  Nasonex 50 Mcg/act Susp (Mometasone furoate) .... 2 sprays inside each nostril daily. 8)  Klor-con 10 10 Meq Cr-tabs (Potassium chloride) .... Take two tab twice a day for three days and then take one tab daily 9)  Onetouch Ultra Test Strp (Glucose blood) .... Please use it as prescribed to check your blood sugar at least once a day (code 250.00)  Patient Instructions: 1)  Please schedule a follow-up appointment in 1 month. 2)  Please take Potassium pills as prescribed. 3)  Please stop taking benazepril.  Prescriptions: ONETOUCH ULTRA TEST  STRP (GLUCOSE BLOOD) Please use it  as prescribed to check your blood sugar at least once a day (Code 250.00)  #1 box x 5   Entered and Authorized by:   Zara Council MD   Signed by:   Zara Council MD on 06/30/2009   Method used:   Electronically to        [facility] #[id number]* (retail)       [address]       [place], [place]  [address]       Ph: [phone]       Fax: [phone]   RxID:   [id number] KLOR-CON 10 10 MEQ CR-TABS (POTASSIUM CHLORIDE) Take two tab twice a day for three days and then take one tab daily  #30 x 5   Entered and Authorized by:   Zara Council MD   Signed by:   Zara Council MD on 06/30/2009   Method used:   Electronically to        [facility] #[id number]* (retail)       [address]       [place], [place]  [address]       Ph: [phone]       Fax: [phone]   RxID:   [id number] AMLODIPINE BESYLATE 10 MG  TABS (AMLODIPINE BESYLATE) Take 1 tablet by mouth once a day  #30 x 5   Entered and Authorized by:   Zara Council MD   Signed by:   Zara Council MD on 06/30/2009   Method used:   Electronically to        [facility] #[id number]* (retail)       [address]       [place], [place]  [address]       Ph: [phone]       Fax: [phone]   RxID:   [id number] ATENOLOL 50 MG  TABS (ATENOLOL) Take 1 tablet by mouth 2x a  day  #60 x 5   Entered and Authorized by:   Zara Council MD   Signed by:   Zara Council MD on 06/30/2009   Method used:   Electronically to        [facility] #[id number]* (retail)       [address]       [place], [place]  [address]       Ph: [phone]       Fax: [phone]   RxID:   [id number] PRAVACHOL 40 MG TABS (PRAVASTATIN SODIUM) Take 2  tablets by mouth once a day  #60 x 5   Entered and Authorized by:   Zara Council MD   Signed by:   Zara Council MD on 06/30/2009   Method used:   Electronically to        [facility] [phone]* (retail)       [address]       [place], [place]  [address]       Ph: [phone]       Fax: [phone]   RxID:   [id number] GLUCOPHAGE 500 MG TABS (METFORMIN HCL) Take 1 tablet by mouth two times a day  #60 x 5   Entered and Authorized by:   Zara Council MD   Signed by:   Zara Council MD on 06/30/2009   Method used:   Electronically to        [facility] [phone]* (retail)       [address]  [place], [place]  [address]       Ph: [phone]       Fax: [phone]   RxID:   [id number] DYAZIDE 37.5-25 MG CAPS (TRIAMTERENE-HCTZ) Take 1 tablet by mouth once a day  #31 x 5   Entered and Authorized by:   Zara Council MD   Signed by:   Zara Council MD on 06/30/2009   Method used:   Electronically to        [facility] [phone]* (retail)       [address]       [place], [place]  [address]       Ph: [phone]       Fax: [phone]   RxID:   [id number]    Prevention & Chronic Care Immunizations   Influenza vaccine: Fluvax Non-MCR  (06/30/2009)   Influenza vaccine deferral: Deferred  (12/01/2008)   Influenza vaccine due: 12/29/2008    Tetanus booster: 12/01/2008: Td   Tetanus booster due: 12/02/2018    Pneumococcal vaccine: Not documented   Pneumococcal vaccine deferral: Deferred  (12/01/2008)  Colorectal Screening   Hemoccult: Not documented    Colonoscopy: Not  documented   Colonoscopy action/deferral: GI referral  (12/01/2008)  Other Screening   PSA: Not documented   PSA action/deferral: Discussed-decision deferred  (12/01/2008)   Smoking status: current  (06/30/2009)   Smoking cessation counseling: yes  (06/30/2009)  Diabetes Mellitus   HgbA1C: 6.6  (06/30/2009)    Eye exam: No diabetic retinopathy.   Visual acuity OD (best corrected): 20/20     Visual acuity OS (best corrected): 20/20   (10/08/2007)    Foot exam: Not documented   High risk foot: Not documented   Foot care education: Not documented    Urine microalbumin/creatinine ratio: 16.9  (09/24/2007)    Diabetes flowsheet reviewed?: Yes   Progress toward A1C goal: At goal  Lipids   Total Cholesterol: 186  (12/02/2008)   Lipid panel action/deferral: Lipid Panel ordered   LDL: 119  (12/02/2008)   LDL Direct: Not documented   HDL: 35  (12/02/2008)   Triglycerides: 160  (12/02/2008)    SGOT (AST): 12  (12/02/2008)   BMP action: Ordered   SGPT (ALT): 12  (12/02/2008)   Alkaline phosphatase: 54  (12/02/2008)   Total bilirubin: 0.5  (12/02/2008)    Lipid flowsheet reviewed?: Yes   Progress toward LDL goal: Unchanged  Hypertension   Last Blood Pressure: 123 / 85  (12/01/2008)   Serum creatinine: 1.44  (11/20/2007)   Serum potassium 4.2  (11/20/2007)    Hypertension flowsheet reviewed?: Yes   Progress toward BP goal: At goal  Self-Management Support :   Personal Goals (by the next clinic visit) :     Personal A1C goal: 7  (06/30/2009)     Personal blood pressure goal: 130/80  (06/30/2009)     Personal LDL goal: 100  (06/30/2009)    Patient will work on the following items until the next clinic visit to reach self-care goals:     Medications and monitoring: take my medicines every day, bring all of my medications to every visit, examine my feet every day  (06/30/2009)     Eating: drink diet soda or water instead of juice or soda, eat more vegetables, use fresh or  frozen vegetables, eat foods that are low in salt, eat baked foods instead of fried foods, limit or avoid alcohol  (06/30/2009)     Activity: take a 30 minute walk every day  (  06/30/2009)    Diabetes self-management support: Resources for patients handout  (06/30/2009)    Hypertension self-management support: Resources for patients handout  (06/30/2009)    Lipid self-management support: Resources for patients handout  (06/30/2009)        Resource handout printed.   Nursing Instructions: Give Flu vaccine today    Laboratory Results   Blood Tests   Date/Time Received: June 30, 2009 11:27 AM  Date/Time Reported: Burke Keels  June 30, 2009 11:27 AM   HGBA1C: 6.6%   (Normal Range: Non-Diabetic - 3-6%   Control Diabetic - 6-8%) CBG Random:: 118mg /dL      Immunizations Administered:  Influenza Vaccine # 1:    Vaccine Type: Fluvax Non-MCR    Site: left deltoid    Mfr: Novartis    Dose: 0.5 ml    Route: IM    Given by: Angelina Ok RN    Exp. Date: 07/2009    Lot #: [id number]    VIS given: 11/21/06 version given June 30, 2009.  Flu Vaccine Consent Questions:    Do you have a history of severe allergic reactions to this vaccine? no    Any prior history of allergic reactions to egg and/or gelatin? no    Do you have a sensitivity to the preservative Thimersol? no    Do you have a past history of Guillan-Barre Syndrome? no    Do you currently have an acute febrile illness? no    Have you ever had a severe reaction to latex? no    Vaccine information given and explained to patient? yes

## 2010-05-30 NOTE — Progress Notes (Signed)
Summary: Refill/gh  Phone Note Refill Request Message from:  Fax from Pharmacy on January 16, 2010 3:02 PM  Refills Requested: Medication #1:  DYAZIDE 37.5-25 MG CAPS Take 1 tablet by mouth once a day   Last Refilled: 09/17/2009  Medication #2:  AMLODIPINE BESYLATE 10 MG  TABS Take 1 tablet by mouth once a day   Last Refilled: 12/20/2009  Medication #3:  ATENOLOL 50 MG  TABS Take 1 tablet by mouth 2x a day   Last Refilled: 12/20/2009  Medication #4:  GLUCOPHAGE 500 MG TABS Take 1 tablet by mouth two times a day Last office visit was 06/30/2009.  Last labs were 11/2008   Method Requested: Electronic Initial call taken by: Angelina Ok RN,  January 16, 2010 3:02 PM  Follow-up for Phone Call        Needs appointment for labs given medication list. 1 month of all meds given until he can be seen. Follow-up by: Julaine Fusi  DO,  January 18, 2010 11:23 AM    Prescriptions: PRAVACHOL 40 MG TABS (PRAVASTATIN SODIUM) Take 2  tablets by mouth once a day  #60 x 0   Entered by:   Julaine Fusi  DO   Authorized by:   Vassie Loll MD   Signed by:   Julaine Fusi  DO on 01/18/2010   Method used:   Electronically to        [facility] [phone]* (retail)       [address]       [place], [place]  [address]       Ph: [phone]       Fax: [phone]   RxID:   [id number] AMLODIPINE BESYLATE 10 MG  TABS (AMLODIPINE BESYLATE) Take 1 tablet by mouth once a day  #30 x 0   Entered by:   Julaine Fusi  DO   Authorized by:   Vassie Loll MD   Signed by:   Julaine Fusi  DO on 01/18/2010   Method used:   Electronically to        [facility] [phone]* (retail)       [address]       [place], [place]  [address]       Ph: [phone]       Fax: [phone]   RxID:   [id number] ATENOLOL 50 MG  TABS (ATENOLOL) Take 1 tablet by mouth 2x a day  #60 x 0   Entered by:   Julaine Fusi  DO   Authorized by:   Vassie Loll MD   Signed by:   Julaine Fusi  DO on 01/18/2010  Method used:   Electronically to        [facility] [phone]* (retail)       [address]       [place], [place]  [address]       Ph: [phone]       Fax: [phone]   RxID:   [id number] GLUCOPHAGE 500 MG TABS (METFORMIN HCL) Take 1 tablet by mouth two times a day  #60 x 0   Entered by:   Julaine Fusi  DO   Authorized by:   Vassie Loll MD   Signed by:   Julaine Fusi  DO on 01/18/2010   Method used:   Electronically to        [facility] [phone]* (retail)       [address]       [place],  [place]  [address]       Ph: [phone]       Fax: [phone]   RxID:   [id number] WUXLKGM 37.5-25 MG CAPS (TRIAMTERENE-HCTZ) Take 1 tablet by mouth once a day  #31 x 0   Entered by:   Julaine Fusi  DO   Authorized by:   Vassie Loll MD   Signed by:   Julaine Fusi  DO on 01/18/2010   Method used:   Electronically to        [facility] [phone]* (retail)       [address]       [place], [place]  [address]       Ph: [phone]       Fax: [phone]   RxID:   [id number]

## 2010-05-30 NOTE — Progress Notes (Signed)
----   Converted from flag ---- ---- 02/21/2010 2:53 PM, Chinita Pester RN wrote: Thanks  ---- 02/21/2010 11:49 AM, Shon Hough wrote: This patient is sch with Dr. Gwenlyn Perking on 03/29/2010.  ---- 02/20/2010 9:13 AM, Chinita Pester RN wrote: Juanell Fairly, Mr. Blomquist needs an appt. as soon as possible per Dr.Butcher to cont. refills.  Thanks ------------------------------

## 2010-07-11 LAB — GLUCOSE, CAPILLARY: Glucose-Capillary: 110 mg/dL — ABNORMAL HIGH (ref 70–99)

## 2010-07-24 LAB — COMPREHENSIVE METABOLIC PANEL
ALT: 32 U/L (ref 0–53)
AST: 25 U/L (ref 0–37)
Albumin: 3.9 g/dL (ref 3.5–5.2)
Alkaline Phosphatase: 52 U/L (ref 39–117)
CO2: 27 mEq/L (ref 19–32)
Chloride: 102 mEq/L (ref 96–112)
GFR calc Af Amer: >60 mL/min (ref >60)
Potassium: 2.9 mEq/L — ABNORMAL LOW (ref 3.5–5.1)
Total Bilirubin: 0.9 mg/dL (ref 0.3–1.2)

## 2010-07-24 LAB — DIFFERENTIAL
Basophils Absolute: 0 10*3/uL (ref 0.0–0.1)
Basophils Relative: 0 % (ref 0–1)
Eosinophils Absolute: 0.1 10*3/uL (ref 0.0–0.7)
Eosinophils Relative: 1 % (ref 0–5)
Monocytes Absolute: 0.4 10*3/uL (ref 0.1–1.0)

## 2010-07-24 LAB — CBC
Platelets: 190 10*3/uL (ref 150–400)
RBC: 5.66 MIL/uL (ref 4.22–5.81)
WBC: 12.1 10*3/uL — ABNORMAL HIGH (ref 4.0–10.5)

## 2010-07-24 LAB — GLUCOSE, CAPILLARY: Glucose-Capillary: 118 mg/dL — ABNORMAL HIGH (ref 70–99)

## 2010-08-13 ENCOUNTER — Encounter: Payer: Self-pay | Admitting: Internal Medicine

## 2010-09-26 ENCOUNTER — Telehealth: Payer: Self-pay | Admitting: *Deleted

## 2010-09-26 ENCOUNTER — Encounter: Payer: Self-pay | Admitting: Internal Medicine

## 2010-09-26 NOTE — Telephone Encounter (Signed)
Pharmacy would like to change pt from dyazide to maxzide due to cost...is this acceptable? If so please change med list and send electronically.   Thank you,h.

## 2010-09-27 ENCOUNTER — Other Ambulatory Visit: Payer: Self-pay | Admitting: Internal Medicine

## 2010-09-27 DIAGNOSIS — I10 Essential (primary) hypertension: Secondary | ICD-10-CM

## 2010-09-27 MED ORDER — TRIAMTERENE-HCTZ 37.5-25 MG PO TABS: 1.0000 | ORAL_TABLET | Freq: Every day | ORAL | Status: DC

## 2011-03-01 ENCOUNTER — Encounter: Payer: Self-pay | Admitting: Internal Medicine

## 2011-04-11 ENCOUNTER — Other Ambulatory Visit: Payer: Self-pay | Admitting: Internal Medicine

## 2011-04-12 ENCOUNTER — Encounter: Payer: Self-pay | Admitting: Internal Medicine

## 2011-04-12 ENCOUNTER — Ambulatory Visit (INDEPENDENT_AMBULATORY_CARE_PROVIDER_SITE_OTHER): Payer: Self-pay | Admitting: Internal Medicine

## 2011-04-12 VITALS — BP 141/94 | HR 85 | Temp 97.1°F | Ht 72.0 in | Wt 345.1 lb

## 2011-04-12 DIAGNOSIS — I10 Essential (primary) hypertension: Secondary | ICD-10-CM

## 2011-04-12 DIAGNOSIS — Z139 Encounter for screening, unspecified: Secondary | ICD-10-CM

## 2011-04-12 DIAGNOSIS — E785 Hyperlipidemia, unspecified: Secondary | ICD-10-CM

## 2011-04-12 DIAGNOSIS — E119 Type 2 diabetes mellitus without complications: Secondary | ICD-10-CM

## 2011-04-12 DIAGNOSIS — N400 Enlarged prostate without lower urinary tract symptoms: Secondary | ICD-10-CM | POA: Insufficient documentation

## 2011-04-12 DIAGNOSIS — Z23 Encounter for immunization: Secondary | ICD-10-CM

## 2011-04-12 DIAGNOSIS — R339 Retention of urine, unspecified: Secondary | ICD-10-CM

## 2011-04-12 LAB — COMPREHENSIVE METABOLIC PANEL
ALT: 14 U/L (ref 0–53)
Albumin: 4.1 g/dL (ref 3.5–5.2)
Alkaline Phosphatase: 53 U/L (ref 39–117)
Glucose, Bld: 109 mg/dL — ABNORMAL HIGH (ref 70–99)
Potassium: 4 mEq/L (ref 3.5–5.3)
Sodium: 143 mEq/L (ref 135–145)
Total Protein: 7.3 g/dL (ref 6.0–8.3)

## 2011-04-12 LAB — LIPID PANEL
HDL: 38 mg/dL — ABNORMAL LOW (ref >39)
Triglycerides: 173 mg/dL — ABNORMAL HIGH (ref <150)

## 2011-04-12 LAB — PSA: PSA: 1.47 ng/mL (ref <=4.00)

## 2011-04-12 MED ORDER — PRAVASTATIN SODIUM 40 MG PO TABS: 40.0000 mg | ORAL_TABLET | Freq: Every day | ORAL | Status: DC

## 2011-04-12 MED ORDER — ATENOLOL 50 MG PO TABS: 50.0000 mg | ORAL_TABLET | Freq: Every day | ORAL | Status: DC

## 2011-04-12 MED ORDER — TAMSULOSIN HCL 0.4 MG PO CAPS: 0.4000 mg | ORAL_CAPSULE | Freq: Every day | ORAL | Status: DC

## 2011-04-12 MED ORDER — METFORMIN HCL 500 MG PO TABS: 500.0000 mg | ORAL_TABLET | Freq: Two times a day (BID) | ORAL | Status: DC

## 2011-04-12 MED ORDER — ASPIRIN 81 MG PO TABS: 81.0000 mg | ORAL_TABLET | Freq: Every day | ORAL | Status: AC

## 2011-04-12 MED ORDER — AMLODIPINE BESYLATE 10 MG PO TABS: 10.0000 mg | ORAL_TABLET | Freq: Every day | ORAL | Status: DC

## 2011-04-12 MED ORDER — TRIAMTERENE-HCTZ 37.5-25 MG PO TABS: 1.0000 | ORAL_TABLET | Freq: Every day | ORAL | Status: DC

## 2011-04-12 NOTE — Assessment & Plan Note (Signed)
Patient's diabetes is well controlled, we'll check an A1c, and adjusts metformin as needed. Will also check by problem creatinine ratio, lipid panel, complete metabolic panel. The patient is going for an eye exam in the upcoming month as a part of his work as a Naval architect, therefore we'll not make an ophthalmology referral.

## 2011-04-12 NOTE — Assessment & Plan Note (Signed)
Patient is due for screening colonoscopy, however he does not have insurance, we'll attempt to see if financial assistance at [facility] can help.

## 2011-04-12 NOTE — Assessment & Plan Note (Signed)
Patient describes symptoms consistent with BPH, will check a PSA today and start patient on tamsulosin, if PSA is elevated we'll have to make a referral to urology

## 2011-04-12 NOTE — Progress Notes (Signed)
Addended by: Youlanda Roys A on: 04/12/2011 10:30 AM   Modules accepted: Orders

## 2011-04-12 NOTE — Assessment & Plan Note (Signed)
Patient's blood pressure slightly elevated despite being compliant with his medications, also given his symptoms of BPH will add tamsulosin to his regiment.

## 2011-04-12 NOTE — Assessment & Plan Note (Signed)
Last LDL is within goal, will check fasting lipid today and refill prescription for pravastatin.

## 2011-04-12 NOTE — Patient Instructions (Signed)

## 2011-04-12 NOTE — Progress Notes (Signed)
  Subjective:    Patient ID: Jeremiah Conway, male    DOB: Aug 27, 1955, 55 y.o.   MRN: [id number]  HPI  Patient is a 55 year old male with a past medical history listed below, presents to the outpatient clinic for routine yearly followup, would like refills of all of his medications, is here for lab testing, reports having to wake up at night multiple times to urinate and reports straining, and other symptoms consistent with urinary retention. Reports that he has been compliant with his medications, and denies any other complaints.   Patient Active Problem List  Diagnoses  . DIABETES MELLITUS, TYPE II  . HYPERLIPIDEMIA  . HYPOKALEMIA  . OBESITY NOS  . SMOKER  . SLEEP APNEA, OBSTRUCTIVE  . HYPERTENSION  . PERICARDITIS  . RBBB  . UNSPECIFIED VENOUS INSUFFICIENCY  . ALLERGIC RHINITIS  . RENAL INSUFFICIENCY  . BUNIONS, BILATERAL  . ANGIOEDEMA  . HEMATURIA, HX OF   Current Outpatient Prescriptions on File Prior to Visit  Medication Sig Dispense Refill  . amLODipine (NORVASC) 10 MG tablet TAKE ONE TABLET BY MOUTH EVERY DAY  30 tablet  11  . aspirin 81 MG tablet Take 81 mg by mouth daily.        Marland Kitchen atenolol (TENORMIN) 50 MG tablet TAKE ONE TABLET BY MOUTH TWICE DAILY  60 tablet  11  . Elastic Bandages & Supports (V-5 HIGH COMPRESSION HOSE) MISC by Does not apply route. Wear while driving, walking       . glucose blood test strip 1 each by Other route as needed. Use as instructed       . metFORMIN (GLUCOPHAGE) 500 MG tablet Take 500 mg by mouth 2 (two) times daily with a meal.        . potassium chloride (KLOR-CON) 10 MEQ CR tablet Take 10 mEq by mouth daily.        . pravastatin (PRAVACHOL) 40 MG tablet TAKE TWO TABLETS BY MOUTH EVERY DAY  60 tablet  11  . triamterene-hydrochlorothiazide (DYAZIDE) 37.5-25 MG per capsule TAKE ONE CAPSULE BY MOUTH EVERY DAY  30 capsule  11  . triamterene-hydrochlorothiazide (MAXZIDE-25) 37.5-25 MG per tablet Take 1 tablet by mouth daily.  30 tablet  11    Allergies  Allergen Reactions  . Ace Inhibitors     REACTION: Angioedema, tongue swelling to benazepril    Review of Systems  Genitourinary: Positive for decreased urine volume.  All other systems reviewed and are negative.       Objective:   Physical Exam  Nursing note and vitals reviewed. Constitutional: He is oriented to person, place, and time. He appears well-developed and well-nourished.  HENT:  Head: Normocephalic and atraumatic.  Eyes: Pupils are equal, round, and reactive to light.  Neck: Normal range of motion. No JVD present. No thyromegaly present.  Cardiovascular: Normal rate, regular rhythm and normal heart sounds.   Pulmonary/Chest: Effort normal and breath sounds normal. He has no wheezes. He has no rales.  Abdominal: Soft. Bowel sounds are normal. There is no tenderness. There is no rebound.  Musculoskeletal: Normal range of motion. He exhibits no edema.  Neurological: He is alert and oriented to person, place, and time.  Skin: Skin is warm and dry.          Assessment & Plan:

## 2011-04-13 LAB — MICROALBUMIN / CREATININE URINE RATIO: Microalb, Ur: 7.59 mg/dL — ABNORMAL HIGH (ref 0.00–1.89)

## 2011-04-14 IMAGING — CR DG CHEST 1V PORT
1 series · 1 of 1 positions shown · non-contrast
Comparison: None.

CLINICAL DATA: Allergic reaction.  Tongue swelling.

PORTABLE CHEST - 1 VIEW

[AP]
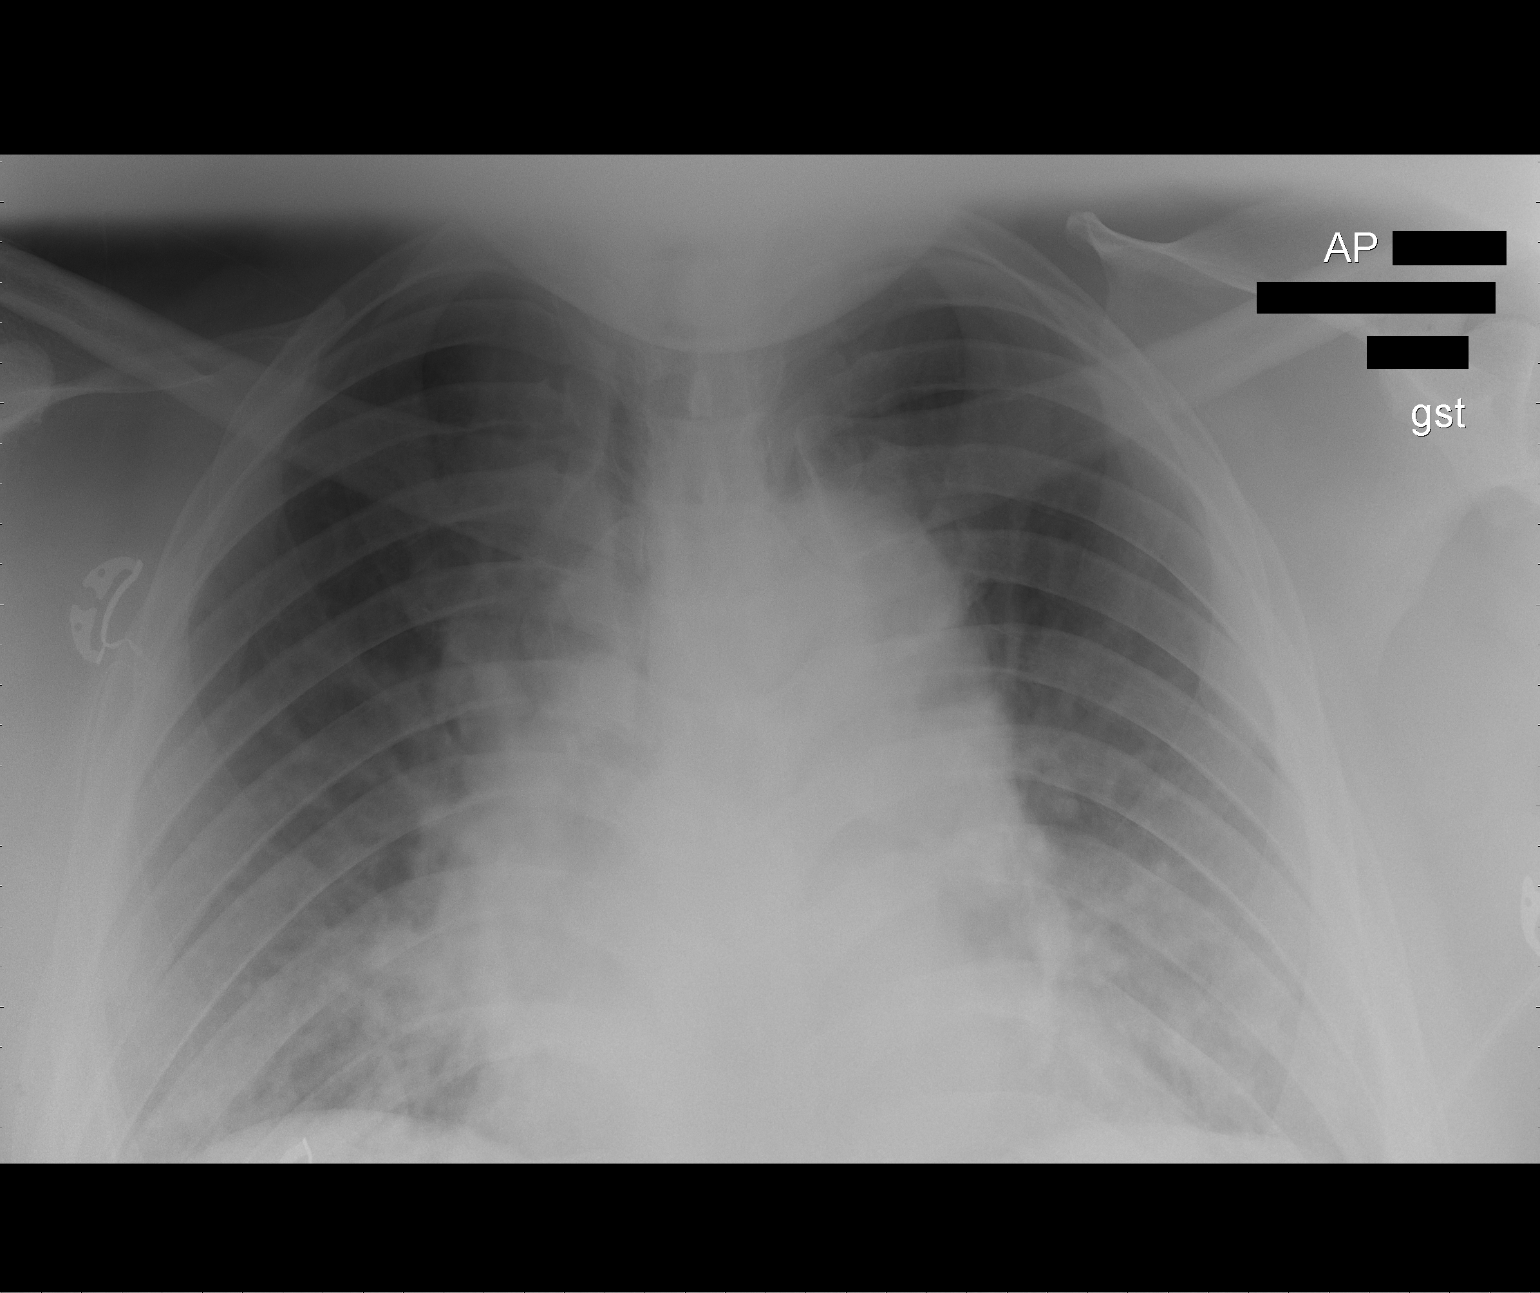

[1 of 1 positions shown; findings below may reference images not displayed]

FINDINGS: The lung volumes are low with some vascular crowding and
basilar atelectasis.  Heart size is upper normal.  No effusion.
IMPRESSION: No acute finding.

## 2011-05-31 NOTE — Progress Notes (Signed)
Addended by: Neomia Dear on: 05/31/2011 06:36 PM   Modules accepted: Orders

## 2011-10-25 ENCOUNTER — Other Ambulatory Visit: Payer: Self-pay | Admitting: *Deleted

## 2011-10-25 DIAGNOSIS — I10 Essential (primary) hypertension: Secondary | ICD-10-CM

## 2011-10-25 MED ORDER — AMLODIPINE BESYLATE 10 MG PO TABS: 10.0000 mg | ORAL_TABLET | Freq: Every day | ORAL | Status: DC

## 2011-10-31 ENCOUNTER — Encounter: Payer: Self-pay | Admitting: Dietician

## 2011-10-31 ENCOUNTER — Ambulatory Visit (INDEPENDENT_AMBULATORY_CARE_PROVIDER_SITE_OTHER): Payer: Self-pay | Admitting: Internal Medicine

## 2011-10-31 ENCOUNTER — Encounter: Payer: Self-pay | Admitting: Internal Medicine

## 2011-10-31 ENCOUNTER — Ambulatory Visit (INDEPENDENT_AMBULATORY_CARE_PROVIDER_SITE_OTHER): Payer: Self-pay | Admitting: Dietician

## 2011-10-31 VITALS — BP 133/90 | HR 78 | Temp 98.3°F | Ht 72.0 in | Wt 351.1 lb

## 2011-10-31 DIAGNOSIS — E785 Hyperlipidemia, unspecified: Secondary | ICD-10-CM

## 2011-10-31 DIAGNOSIS — E119 Type 2 diabetes mellitus without complications: Secondary | ICD-10-CM

## 2011-10-31 DIAGNOSIS — I10 Essential (primary) hypertension: Secondary | ICD-10-CM

## 2011-10-31 DIAGNOSIS — G4733 Obstructive sleep apnea (adult) (pediatric): Secondary | ICD-10-CM

## 2011-10-31 DIAGNOSIS — J309 Allergic rhinitis, unspecified: Secondary | ICD-10-CM

## 2011-10-31 DIAGNOSIS — F17200 Nicotine dependence, unspecified, uncomplicated: Secondary | ICD-10-CM

## 2011-10-31 DIAGNOSIS — R609 Edema, unspecified: Secondary | ICD-10-CM

## 2011-10-31 DIAGNOSIS — Z79899 Other long term (current) drug therapy: Secondary | ICD-10-CM

## 2011-10-31 DIAGNOSIS — I872 Venous insufficiency (chronic) (peripheral): Secondary | ICD-10-CM

## 2011-10-31 LAB — POCT GLYCOSYLATED HEMOGLOBIN (HGB A1C): Hemoglobin A1C: 6.8

## 2011-10-31 LAB — GLUCOSE, CAPILLARY: Glucose-Capillary: 113 mg/dL — ABNORMAL HIGH (ref 70–99)

## 2011-10-31 MED ORDER — V-5 HIGH COMPRESSION HOSE MISC: Status: AC

## 2011-10-31 MED ORDER — LORATADINE 10 MG PO TABS: 10.0000 mg | ORAL_TABLET | Freq: Every evening | ORAL | Status: DC | PRN

## 2011-10-31 MED ORDER — PRAVASTATIN SODIUM 80 MG PO TABS: 80.0000 mg | ORAL_TABLET | Freq: Every evening | ORAL | Status: DC

## 2011-10-31 NOTE — Patient Instructions (Addendum)
To do List:  1. Please see Jeremiah Conway about obtaining an orange card.   2. Please take [facility] to [facility] and bring a report of the CPAP machine settings and usage download to the clinic so we can fill out your work forms  3. Please use your CPAP machine every night  4. Follow up with [name], our diabetes educator  5. Follow up with your eye doctor. It is very important that you have your diabetes eye exam.  Medication changes:  Start taking Claritin for your seasonal allergy. You may take it during pollen season only.  Stop using Affrin as we discussed Start taking the increase dose of pravastatin, your cholesterol medicine. Your last LDL, or 'bad' cholesterol was 123, we would like to see it less than 100. Diet changes will also help lower your cholesterol  Instructions: Use oil/cream on your feet daily, dry well between your toes.   Do not drink water, or other liquids 1-2 hours before going to bed Please call us with any concerns

## 2011-10-31 NOTE — Patient Instructions (Addendum)
For weight loss consider the following:  1-  trying to eat lower fat:            -  [employer]: Grilled chicken  Instead of double cheese burger                           small fry or salad instead of medium fries            - 2 creams instead of 3 in coffee            -  fruit, unsalted nuts and vegetables instead of chips, sweet snacks            - regular hot dog instead of a chili cheese dog  2- Lower sugar intake            - tea instead of soda( could also drink vegetable juice- tomato or V8            - fruit, Lite yogurt instead of little Debbie snacks  3- Increase activity            - park farther away from building            - use stairs when possible instead of elevators           - get up to answer phone, change channel           - walk 5 minutes after each meal and snack  Call me anytime with questions! [name] [phone]

## 2011-10-31 NOTE — Progress Notes (Signed)
Medical Nutrition Therapy:  Appt start time: 1030 end time:  1055.  Assessment:  Primary concerns today: Weight management.  Usual eating pattern includes 2 meals and 2 snacks per day. Usual physical activity includes very little per patient.occupation is truck Horticulturist, commercial foods include coffee, fries, hot dogs, burgers.  Avoided foods include milk.   24-hr recall: B ( 10 AM)-  Medium coffee with 3 creamers and splenda, 2 chili cheese dogs  Snk (1-2 PM)Medium coffee with 3 creamers and splenda, corn chips      D ( PM)- [employer]- double cheeseburger, medium fried nad medium coke  Snk ( 11PM-12AM)- Medium coffee with 3 creamers and splenda, little Debbie cake    Progress Towards Goal(s):  In progress.   Nutritional Diagnosis:  NB-1.1 Food and nutrition-related knowledge deficit lack of prior exposure to caloric density of foods and healthier food choices as evidenced by patient questions and food choices. 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    Intervention:   1- Nutrition education about lower fat and caloric density, less processed food  Choices. 2- Calorie Brooke Dare book provided to patient with examples demonstrated back by patient showing he understands how to use it to make healthier choices.   Monitoring/Evaluation:  Dietary intake, exercise, blood sugars, and body weight prn.

## 2011-11-05 NOTE — Progress Notes (Signed)
Subjective:    Patient ID: Jeremiah Conway, male    DOB: March 14, 1956, 56 y.o.   MRN: [id number]  HPI Jeremiah Conway is a 56 yo man with PMH significant for DM2, HTN, and OSA who comes in today for a routine annual exam, Occupational Health evaluation, and acute allergic rhinitis. He continues to take his medicines as prescribed except for pravastatin as he has ran out of that prescription.  He routinely checks his blood glucose but has not brought his monitor with him today. He tells me that his before breakfast blood glucose has been in the lower 100s. His Hb A1c today is. He performs routine feet check but not daily. His last eye exam was years ago.   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 Following up on his medical care has been challenging for him because of his long assigned trips. 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 He has brought these forms in today however, I will not be able to sign his OSA compliance form given that he has not brought his CPAP machine download today.   For about 2-3 months now Jeremiah Conway has been experiencing continuous clear nasal discharge, nasal congestion, and occasional eye pruritis with increased tears. He has had these symptoms in the past, especially during the Spring and Summer months and believes it might be related to pollen allergy. For the last 2-3 weeks he has been using Affrin nasal spray with moderated improvement of his nasal congestion. He denies fever, chills, cough, headache, sinus pain, and thick nasal or eye discharge.   Of note, Jeremiah Conway has been trying to quit, his last pack of cigarettes was 4-5 days ago. He is currently using electronic cigarettes with nicotine as they are readily available for purchasing  at popular truck stops.     Review of Systems  Constitutional: Negative for fever, chills, diaphoresis, activity change, appetite change, fatigue and unexpected weight change.  HENT: Positive for congestion, rhinorrhea, sneezing and postnasal drip. Negative for ear pain, nosebleeds, facial swelling and ear discharge.   Eyes: Positive for itching. Negative for photophobia, pain, discharge, redness and visual disturbance.  Respiratory: Positive for apnea. Negative for cough, choking, chest tightness, shortness of breath and wheezing.   Cardiovascular: Positive for leg swelling. Negative for chest pain.  Genitourinary: Positive for frequency. Negative for dysuria, urgency, enuresis and difficulty urinating.  Neurological: Negative for dizziness and numbness.  Hematological: Negative for adenopathy.       Objective:   Physical Exam  Constitutional: He is oriented to person, place, and time. He appears well-developed and well-nourished. No distress.  HENT:  Head: Normocephalic and atraumatic.  Right Ear: External ear normal.  Left Ear: External ear normal.  Mouth/Throat: Oropharynx is clear and moist. No oropharyngeal exudate.       Erythematous nasal turbinates bilaterally.   Eyes: Conjunctivae are normal. Right eye exhibits no discharge. Left eye exhibits no discharge. No scleral icterus.  Neck: Neck supple.  Cardiovascular: Normal rate, normal heart sounds and intact distal pulses.   Pulmonary/Chest: Effort normal and breath sounds normal. No respiratory distress. He has no wheezes. He has no rales. He exhibits no tenderness.  Musculoskeletal: He exhibits edema.  Trace pitting edema bilaterally up to his knees  Lymphadenopathy:    He has no cervical adenopathy.  Neurological: He is alert and oriented to person, place, and time.  Skin: Skin is warm and dry. No rash noted. He is not diaphoretic. No erythema.  Psychiatric: He has a normal mood and affect. His behavior is normal.           Assessment & Plan:

## 2011-11-06 ENCOUNTER — Encounter: Payer: Self-pay | Admitting: Internal Medicine

## 2011-11-06 NOTE — Assessment & Plan Note (Signed)
Pt with allergic rhinitis during Spring and Summer months.  --Ordered Claritin 10mg  tab but at bed time to prevent drowsiness during the day --Advised pt to stop using Afrin nasal spray everyday

## 2011-11-06 NOTE — Assessment & Plan Note (Signed)
LDL above goal of <100, increased pravachol to 80mg .

## 2011-11-06 NOTE — Assessment & Plan Note (Signed)
Pt to follow up with [facility] for his CPAP machine download which is required for his OSA form requested by Occupational Health. Pt advised to take his CPAP machine on the road with him and to use every night.

## 2011-11-06 NOTE — Assessment & Plan Note (Signed)
Well controlled today. Will continue his current medications

## 2011-11-06 NOTE — Assessment & Plan Note (Signed)
Pt last smoke cigarettes 3 days ago, he is currently using electronic cigarettes to help him quit.

## 2011-11-06 NOTE — Assessment & Plan Note (Addendum)
His HbA1c is 6.8 today. No changes in his medication. Pt to see optometrist at [facility] or ophthalmologist at [facility] for a diabetic eye exam

## 2011-11-06 NOTE — Assessment & Plan Note (Signed)
He wears his compression hoses everyday. Gave him a new prescription for high compression hoses.

## 2011-11-07 NOTE — Progress Notes (Signed)
I saw, examined, and discussed the patient with Dr Garald Braver and agree with the note contained here. Jeremiah Jeremiah Conway driving forms required a statement as to his compliance with CPAP usage. 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 His machine was bought off [employer] and therefore, we cannot obtain a download about its nightly usage. Dr Garald Braver is working with Jeremiah Conway to get his machine checked out / registered by [facility] so that his form can appropriately be completed.

## 2011-12-04 NOTE — Addendum Note (Signed)
Addended by: Dorie Rank E on: 12/04/2011 12:52 PM   Modules accepted: Orders

## 2012-01-03 ENCOUNTER — Encounter: Payer: Self-pay | Admitting: Internal Medicine

## 2012-01-03 ENCOUNTER — Ambulatory Visit (INDEPENDENT_AMBULATORY_CARE_PROVIDER_SITE_OTHER): Payer: Self-pay | Admitting: Internal Medicine

## 2012-01-03 VITALS — BP 114/75 | HR 97 | Temp 97.9°F | Ht 72.0 in | Wt 349.6 lb

## 2012-01-03 DIAGNOSIS — J309 Allergic rhinitis, unspecified: Secondary | ICD-10-CM

## 2012-01-03 DIAGNOSIS — G4733 Obstructive sleep apnea (adult) (pediatric): Secondary | ICD-10-CM

## 2012-01-03 DIAGNOSIS — I10 Essential (primary) hypertension: Secondary | ICD-10-CM

## 2012-01-03 DIAGNOSIS — E119 Type 2 diabetes mellitus without complications: Secondary | ICD-10-CM

## 2012-01-03 MED ORDER — FLUTICASONE PROPIONATE 50 MCG/ACT NA SUSP: NASAL | Status: DC

## 2012-01-03 MED ORDER — CETIRIZINE HCL 10 MG PO CAPS: 10.0000 mg | ORAL_CAPSULE | Freq: Every day | ORAL | Status: DC

## 2012-01-03 NOTE — Patient Instructions (Addendum)
1.  Start Zyrtec or Allegra.  Follow the packaging recommendations and take 1 tablet daily.  The generic brand works just as good.  2.  Start the Flonase (nasal spray).  Spray once into each nostril twice daily for the sinusitis.  3.  Stop over to the Financial counselor to see about getting set up for helping get your medications as well as paying for your medical care.  4.  Stop at Department of social services.  Their address is [address]. [place], [place] [address].  Their phone number is [phone].  They can help you with what benefits you qualify for.  5. Follow up with your primary care doctor in October to get your diabetes rechecked.

## 2012-01-03 NOTE — Progress Notes (Signed)
Subjective:   Patient ID: Jeremiah Conway male   DOB: 03-Mar-1956 56 y.o.   MRN: [id number]  HPI: Jeremiah Conway is a 56 y.o. man who present to clinic today complaining of sinus problems.    He states that he has had problems with his sinuses for the last 3 months.  He states that he is constantly congested with a stuffy nose and is not able breath through his nose.  He denies post nasal, red eyes, itching, pressure behind the eyes, or a history of allergies. He states that he has an almost constant clear drainage from both nostrils.  He also denies headaches, trauma, or a change in his smell or taste.   He has a history of OSA and has a CPAP that he has been unable to use for the last few months because of the problems with his sinuses.  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  He asks today about filing for social security.    He states that he continues to take his medications for his diabetes and his hypertension.  He did not bring his meter with him today.  His last A1C was in July and was 6.8%  Past Medical History  Diagnosis Date  . Diabetes mellitus   . Hyperlipidemia   . Hypertension   . Obesity   . Unspecified venous (peripheral) insufficiency   . Hypokalemia   . Angioedema   . Allergic rhinitis   . History of hematuria   . Bilateral bunions   . RBBB (right bundle branch block with left anterior fascicular block)   . Tobacco abuse     Stopped in 2002. Now smokes 1.5ppd  . OSA on CPAP   . Pericarditis 1988  . Renal insufficiency     Cr 1.2 baseline   Current Outpatient Prescriptions  Medication Sig Dispense Refill  . amLODipine (NORVASC) 10 MG tablet Take 1 tablet (10 mg total) by mouth daily.  30 tablet  11  . aspirin 81 MG tablet Take 1 tablet (81 mg total) by mouth daily.  30 tablet  11  . atenolol (TENORMIN) 50 MG tablet Take 1 tablet (50  mg total) by mouth daily.  30 tablet  11  . Elastic Bandages & Supports (V-5 HIGH COMPRESSION HOSE) MISC 1pair, Wear them while driving, walking, standing  1 each  1  . glucose blood test strip 1 each by Other route as needed. Use as instructed       . loratadine (CLARITIN) 10 MG tablet Take 1 tablet (10 mg total) by mouth at bedtime as needed for allergies (may take it only during allergy seasons. Take it before going to sleep to avoid daytime drowsiness).  30 tablet  2  . metFORMIN (GLUCOPHAGE) 500 MG tablet Take 1 tablet (500 mg total) by mouth 2 (two) times daily with a meal.  60 tablet  11  . potassium chloride (KLOR-CON) 10 MEQ CR tablet Take 10 mEq by mouth daily.        . pravastatin (PRAVACHOL) 80 MG tablet Take 1 tablet (80 mg total) by mouth every evening.  30 tablet  11  . Tamsulosin HCl (FLOMAX) 0.4 MG CAPS Take 1 capsule (0.4 mg total) by mouth daily after breakfast.  30 capsule  11  . triamterene-hydrochlorothiazide (MAXZIDE-25) 37.5-25 MG per tablet Take 1 each (1 tablet total)  by mouth daily.  30 tablet  11   Family History  Problem Relation Age of Onset  . Diabetes Father    History   Social History  . Marital Status: Divorced    Spouse Name: N/A    Number of Children: N/A  . Years of Education: N/A   Social History Main Topics  . Smoking status: Current Some Day Smoker -- 1.0 packs/day    Types: Cigarettes  . Smokeless tobacco: Not on file  . Alcohol Use: Not on file  . Drug Use: Not on file  . Sexually Active: Not on file   Other Topics Concern  . 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    Review of Systems: A 12 system ROS is negative except as noted in the HPI.   Objective:  Physical Exam: Filed Vitals:   01/03/12 1336  BP: 114/75  Pulse: 97  Temp: 97.9 F (36.6 C)  TempSrc: Oral  Height: 6' (1.829 m)  Weight: 349 lb 9.6 oz (158.578 kg)  SpO2: 97%   Constitutional: Vital signs reviewed.  Patient is a  well-developed and well-nourished man in no acute distress and cooperative with exam. Alert and oriented x3.  Head: Normocephalic and atraumatic Ear: TM normal bilaterally Nose: bilateral turbinate erythema with no purulent drainage noted. No pain with sinus palpation.   Mouth: no erythema or exudates, MMM Eyes: PERRL, EOMI, conjunctivae with mild injection, No scleral icterus.  Neck: Supple, Trachea midline normal ROM, No JVD, mass, thyromegaly, or carotid bruit present.  Cardiovascular: RRR, S1 normal, S2 normal, no MRG, pulses symmetric and intact bilaterally Pulmonary/Chest: CTAB, no wheezes, rales, or rhonchi Abdominal: Soft. Non-tender, non-distended, bowel sounds are normal, no masses, organomegaly, or guarding present.  GU: no CVA tenderness Musculoskeletal: No joint deformities, erythema, or stiffness, ROM full and no nontender Hematology: no cervical, inginal, or axillary adenopathy.  Neurological: A&O x3, Strength is normal and symmetric bilaterally, cranial nerve II-XII are grossly intact, no focal motor deficit, sensory intact to light touch bilaterally.  Skin: Warm, dry and intact. No rash, cyanosis, or clubbing.  Psychiatric: Normal mood and affect. speech and behavior is normal. Judgment and thought content normal. Cognition and memory are normal.   Assessment & Plan:

## 2012-03-19 ENCOUNTER — Telehealth: Payer: Self-pay | Admitting: Dietician

## 2012-03-19 NOTE — Telephone Encounter (Signed)
Front office sent patient a letter asking him to call office to schedule appointment

## 2012-04-03 NOTE — Assessment & Plan Note (Signed)
Lab Results  Component Value Date   NA 143 04/12/2011   K 4.0 04/12/2011   CL 102 04/12/2011   CO2 28 04/12/2011   BUN 17 04/12/2011   CREATININE 1.29 04/12/2011   CREATININE 1.25 04/04/2010    BP Readings from Last 3 Encounters:  01/03/12 114/75  10/31/11 133/90  04/12/11 141/94    Assessment: Hypertension control:  controlled  Progress toward goals:  at goal Barriers to meeting goals:  no barriers identified  Plan: Hypertension treatment:  continue current medications

## 2012-04-03 NOTE — Assessment & Plan Note (Signed)
His symptoms are most likely secondary to allergic rhinitis.  There is no evidence of acute bacterial sinusitis.  We will change his claritin to Zyrtec and have him start a nasal steroid.

## 2012-04-03 NOTE — Assessment & Plan Note (Signed)
He was encouraged to use his CPAP and to use it with humidity to help keep his nasal passages moist during use.

## 2012-04-03 NOTE — Assessment & Plan Note (Signed)
Lab Results  Component Value Date   HGBA1C 6.8 10/31/2011   HGBA1C 6.5 04/12/2011   HGBA1C 6.2 04/04/2010   Lab Results  Component Value Date   MICROALBUR 7.59* 04/12/2011   LDLCALC 123* 04/12/2011   CREATININE 1.29 04/12/2011   He is not due for an A1C today.  He has been well controlled in the past and he was encouraged to continue his medication as well as continue work on diet and exercise.

## 2012-04-18 ENCOUNTER — Ambulatory Visit (INDEPENDENT_AMBULATORY_CARE_PROVIDER_SITE_OTHER): Payer: Medicaid Other | Admitting: Internal Medicine

## 2012-04-18 ENCOUNTER — Encounter: Payer: Self-pay | Admitting: Internal Medicine

## 2012-04-18 VITALS — BP 124/90 | HR 84 | Temp 97.1°F | Resp 20 | Ht 69.25 in | Wt 350.0 lb

## 2012-04-18 DIAGNOSIS — I129 Hypertensive chronic kidney disease with stage 1 through stage 4 chronic kidney disease, or unspecified chronic kidney disease: Secondary | ICD-10-CM

## 2012-04-18 DIAGNOSIS — Z23 Encounter for immunization: Secondary | ICD-10-CM

## 2012-04-18 DIAGNOSIS — F17200 Nicotine dependence, unspecified, uncomplicated: Secondary | ICD-10-CM

## 2012-04-18 DIAGNOSIS — E785 Hyperlipidemia, unspecified: Secondary | ICD-10-CM

## 2012-04-18 DIAGNOSIS — G4733 Obstructive sleep apnea (adult) (pediatric): Secondary | ICD-10-CM

## 2012-04-18 DIAGNOSIS — Z87448 Personal history of other diseases of urinary system: Secondary | ICD-10-CM

## 2012-04-18 DIAGNOSIS — I10 Essential (primary) hypertension: Secondary | ICD-10-CM

## 2012-04-18 DIAGNOSIS — Z79899 Other long term (current) drug therapy: Secondary | ICD-10-CM

## 2012-04-18 DIAGNOSIS — E119 Type 2 diabetes mellitus without complications: Secondary | ICD-10-CM

## 2012-04-18 DIAGNOSIS — N182 Chronic kidney disease, stage 2 (mild): Secondary | ICD-10-CM

## 2012-04-18 DIAGNOSIS — Z1211 Encounter for screening for malignant neoplasm of colon: Secondary | ICD-10-CM

## 2012-04-18 DIAGNOSIS — R339 Retention of urine, unspecified: Secondary | ICD-10-CM

## 2012-04-18 LAB — BASIC METABOLIC PANEL
BUN: 17 mg/dL (ref 6–23)
CO2: 30 mEq/L (ref 19–32)
Calcium: 10.1 mg/dL (ref 8.4–10.5)
Creat: 1.47 mg/dL — ABNORMAL HIGH (ref 0.50–1.35)
Glucose, Bld: 86 mg/dL (ref 70–99)
Sodium: 141 mEq/L (ref 135–145)

## 2012-04-18 LAB — POCT GLYCOSYLATED HEMOGLOBIN (HGB A1C): Hemoglobin A1C: 6.7

## 2012-04-18 LAB — LIPID PANEL
HDL: 38 mg/dL — ABNORMAL LOW (ref >39)
LDL Cholesterol: 110 mg/dL — ABNORMAL HIGH (ref 0–99)
Total CHOL/HDL Ratio: 5.5 Ratio
VLDL: 60 mg/dL — ABNORMAL HIGH (ref 0–40)

## 2012-04-18 LAB — GLUCOSE, CAPILLARY: Glucose-Capillary: 85 mg/dL (ref 70–99)

## 2012-04-18 MED ORDER — TRIAMTERENE-HCTZ 37.5-25 MG PO TABS: 1.0000 | ORAL_TABLET | Freq: Every day | ORAL | Status: DC

## 2012-04-18 MED ORDER — METFORMIN HCL 500 MG PO TABS: 500.0000 mg | ORAL_TABLET | Freq: Two times a day (BID) | ORAL | Status: DC

## 2012-04-18 MED ORDER — TAMSULOSIN HCL 0.4 MG PO CAPS
0.4000 mg | ORAL_CAPSULE | Freq: Every day | ORAL | Status: DC
Start: 2012-04-18 — End: 2013-06-04

## 2012-04-18 MED ORDER — ATENOLOL 50 MG PO TABS: 50.0000 mg | ORAL_TABLET | Freq: Every day | ORAL | Status: DC

## 2012-04-18 MED ORDER — PRAVASTATIN SODIUM 80 MG PO TABS: 80.0000 mg | ORAL_TABLET | Freq: Every evening | ORAL | Status: DC

## 2012-04-18 NOTE — Patient Instructions (Addendum)
General Instructions:  Keep up the good work, taking your medicines!  Stop smoking.  We have made referrals for a sleep study, colonoscopy, and eye exam.    Treatment Goals:  Goals (1 Years of Data) as of 04/18/2012          As of Today As of Today 01/03/12 10/31/11 04/12/11     Blood Pressure    . Blood Pressure < 130/80  124/90 142/95 114/75 133/90 141/94     Result Component    . HEMOGLOBIN A1C < 7.0  6.7   6.8 6.5    . LDL CALC < 100      123      Progress Toward Treatment Goals:  Treatment Goal 04/18/2012  Hemoglobin A1C at goal  Blood pressure at goal  Stop smoking smoking the same amount    Self Care Goals & Plans:  Self Care Goal 04/18/2012  Manage my medications take my medicines as prescribed; bring my medications to every visit; refill my medications on time  Monitor my health keep track of my blood glucose; check my feet daily  Eat healthy foods eat more vegetables; eat baked foods instead of fried foods; eat fruit for snacks and desserts; drink diet soda or water instead of juice or soda; eat smaller portions  Be physically active find an activity I enjoy  Stop smoking go to the [employer] ([email or web address])    Home Blood Glucose Monitoring 04/18/2012  Check my blood sugar (No Data)  When to check my blood sugar (No Data)     Care Management & Community Referrals:  Referral 04/18/2012  Referrals made for care management support none needed

## 2012-04-18 NOTE — Progress Notes (Signed)
Subjective:    Patient ID: Jeremiah Conway, male    DOB: 1955-10-26, 56 y.o.   MRN: [id number]  HPI:  This is a 56 year old man presenting for follow up.  He was previously diagnosed with obstructive sleep apnea several years ago but has not used his CPAP machine in over three years.  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 Now that he is home more, he wants to focus on his health and get a new CPAP set up. He does snore during the night and complains of daytime fatigue. He denies morning headaches.  He also has diabetes managed with metformin and hypertension managed with a three drug regimen.  He has hyperlipidemia and has been on pravastatin 80 in the past, but several months ago, his prescription was somehow changed at the pharmacy to pravastatin 40.  He also has a historical diagnosis of hematuria demonstrated on a prior urinalysis. He denies headaches, dizziness, chest pain, dyspnea, palpitations, abdominal pain, nausea, vomiting, diarrhea, constipation, dysuria, urinary troubles and symptoms of BPH, hematuria and hematochezia.    Patient Active Problem List  Diagnosis  . DIABETES MELLITUS, TYPE II  . HYPERLIPIDEMIA  . HYPOKALEMIA  . OBESITY NOS  . SMOKER  . SLEEP APNEA, OBSTRUCTIVE  . HYPERTENSION  . PERICARDITIS  . RBBB  . UNSPECIFIED VENOUS INSUFFICIENCY  . ALLERGIC RHINITIS  . Chronic kidney disease (CKD), stage II (mild)  . BUNIONS, BILATERAL  . ANGIOEDEMA  . HEMATURIA, HX OF  . Urine retention  . Screening    Name Route Sig  . AMLODIPINE BESYLATE 10 MG PO TABS Oral Take 1 tablet (10 mg total) by mouth daily.  . ASPIRIN 81 MG PO TABS  Oral  Take 1 tablet (81 mg total) by mouth daily.   . ATENOLOL 50 MG PO TABS  Oral  Take 1 tablet (50 mg total) by mouth daily.   Marland Kitchen METFORMIN HCL 500 MG PO TABS  Oral  Take 1 tablet (500 mg total) by mouth 2 (two) times daily with a meal.   . PRAVASTATIN SODIUM 40 MG PO TABS  Oral  Take 1 tablet  (80 mg total) by mouth every evening.   Marland Kitchen TAMSULOSIN HCL 0.4 MG PO CAPS  Oral  Take 1 capsule (0.4 mg total) by mouth daily after breakfast.   . TRIAMTERENE-HCTZ 37.5-25 MG PO TABS  Oral  Take 1 each (1 tablet total) by mouth daily.     Review of Systems  Constitutional: Negative.  Negative for fever and chills.  HENT: Negative.  Negative for congestion.   Eyes: Positive for visual disturbance (Blurry vision late in the day).  Respiratory: Negative for cough and shortness of breath.   Cardiovascular: Positive for leg swelling. Negative for chest pain and palpitations.  Gastrointestinal: Negative.  Negative for nausea, vomiting, abdominal pain, diarrhea, constipation and blood in stool.  Genitourinary: Negative.  Negative for difficulty urinating.  Neurological: Negative.  Negative for dizziness, light-headedness and headaches.  Psychiatric/Behavioral: Negative.  Negative for dysphoric mood.       Objective:   Physical Exam: GENERAL: Obese; no acute distress HEAD: atraumatic, normocephalic EYES: pupils equal, round and reactive; sclera anicteric; normal conjunctiva NOSE/THROAT: oropharynx clear, moist mucous membranes, pink gums without gingival disease, carious dentition NECK: supple, no carotid bruits LUNGS: expiratory wheezes and bronchial sounds, and impaired chest excursion with deep breathing but normal work of breathing at baseline HEART: normal rate and regular rhythm; normal S1 and  S2 without S3 or S4; no murmurs, rubs, or clicks PULSES: radial 2+ and symmetric ABDOMEN: soft, non-tender, normal bowel sounds SKIN: warm, dry, intact, normal turgor, no rashes EXTREMITIES: 1+ pitting edema in the lower extremities; no clubbing or cyanosis PSYCH: patient is alert and oriented, mood and affect are normal and congruent, thought content is normal without delusions, thought process is linear, speech is normal and non-pressured, behavior is normal, judgement and insight are  normal, no suicidal or homicidal ideations  Filed Vitals:   04/18/12 1354  BP: 124/90  Pulse: 84  Temp:   Resp:     BP Readings from Last 3 Encounters:  04/18/12 124/90  01/03/12 114/75  10/31/11 133/90    Diabetes Component Value Date   HgbA1c 6.7 04/18/2012   CBG 85 04/18/2012    Lipid Panel  Component Value Date/Time   CHOL 208* 04/18/2012 1425   TRIG 302* 04/18/2012 1425   HDL 38* 04/18/2012 1425   LDLCALC 110* 04/18/2012 1425    Urinalysis Component Value Date/Time   COLORURINE YELLOW 04/18/2012 1509   APPEARANCEUR CLEAR 04/18/2012 1509   LABSPEC 1.015 04/18/2012 1509   PHURINE 6.5 04/18/2012 1509   GLUCOSEU NEG 04/18/2012 1509   HGBUR NEG 04/18/2012 1509   BILIRUBINUR NEG 04/18/2012 1509   KETONESUR NEG 04/18/2012 1509   PROTEINUR NEG 04/18/2012 1509   UROBILINOGEN 0.2 04/18/2012 1509   NITRITE NEG 04/18/2012 1509   LEUKOCYTESUR NEG 04/18/2012 1509    Urine Microalbumin-to-Creatinine Ratio Component Value Date/Time   Ratio 5.2 04/18/2012 1509    BMET Component Value Date/Time   NA 141 04/18/2012 1425   K 3.8 04/18/2012 1425   CL 103 04/18/2012 1425   CO2 30 04/18/2012 1425   GLUCOSE 86 04/18/2012 1425   BUN 17 04/18/2012 1425   CREATININE 1.47* 04/18/2012 1425   CREATININE 1.25 04/04/2010 2027   CALCIUM 10.1 04/18/2012 1425        Assessment & Plan:

## 2012-04-19 LAB — URINALYSIS, ROUTINE W REFLEX MICROSCOPIC
Bilirubin Urine: NEGATIVE
Glucose, UA: NEGATIVE mg/dL
Hgb urine dipstick: NEGATIVE
Ketones, ur: NEGATIVE mg/dL
Leukocytes, UA: NEGATIVE
Protein, ur: NEGATIVE mg/dL
pH: 6.5 (ref 5.0–8.0)

## 2012-04-19 LAB — MICROALBUMIN / CREATININE URINE RATIO
Creatinine, Urine: 165.9 mg/dL
Microalb Creat Ratio: 5.2 mg/g (ref 0.0–30.0)
Microalb, Ur: 0.87 mg/dL (ref 0.00–1.89)

## 2012-04-20 ENCOUNTER — Encounter: Payer: Self-pay | Admitting: Internal Medicine

## 2012-04-20 NOTE — Assessment & Plan Note (Signed)
  Assessment:  Progress toward smoking cessation:  smoking the same amount  Barriers to progress toward smoking cessation:  none  Comments: Ready to quit and very confident he can quiet without assistance since he quit for 15 months once in the past.  Plan:  Instruction/counseling given:  I counseled patient on the dangers of tobacco use and advised patient to stop smoking.  Educational resources provided:  Q[employer]lineNC [employer]) brochure  Self management tools provided:  smoking cessation plan (STAR Quit Plan)  Medications to assist with smoking cessation: none needed now Patient agreed to the following self-care plans for smoking cessation:  go to the [employer] ([email or web address])

## 2012-04-20 NOTE — Assessment & Plan Note (Signed)
BP Readings from Last 3 Encounters:  04/18/12 124/90  01/03/12 114/75  10/31/11 133/90    Lab Results  Component Value Date   NA 141 04/18/2012   K 3.8 04/18/2012   CREATININE 1.47* 04/18/2012    Assessment:  Blood pressure control: controlled  Progress toward BP goal:  at goal  Plan:  Medications: continue current regimen of TRIAMTERENE-HCTZ 37.5-25 MG daily, ATENOLOL 50 MG daily, and AMLODIPINE 10 MG daily  Educational resources provided: handout  Self management tools provided: none  Other plans: treat sleep apnea with CPAP

## 2012-04-20 NOTE — Assessment & Plan Note (Signed)
Stable on Flomax.  No symptoms of BPH.  Continue current pharmacotherapy.

## 2012-04-20 NOTE — Assessment & Plan Note (Signed)
History of microscopic hematuria on urinalysis.  Urinalysis today was negative for hemoglobin.

## 2012-04-20 NOTE — Assessment & Plan Note (Signed)
Above goal of LDL < 100 on pravastatin 40mg . Instructed patient to stake two 40mg  tablets and then fill new rx for 80mg .

## 2012-04-20 NOTE — Assessment & Plan Note (Signed)
Since it has been over three years since he has worn a CPAP, new settings are likely necessary.  I certinaly think he still suffers with obstructive sleep apnea with snoring and daytime fatigue.  His blood pressure and glycemia would be much easier to control if his sleep apnea was controlled with nocturnal CPAP. - nocturnal polysomnography then set up with new CPAP

## 2012-04-20 NOTE — Assessment & Plan Note (Addendum)
Lab Results  Component Value Date   HGBA1C 6.7 04/18/2012   HGBA1C 6.8 10/31/2011   HGBA1C 6.5 04/12/2011     Assessment:  Diabetes control: good control (HgbA1C at goal)  Progress toward A1C goal:  at goal  Plan:  Medications: Continue metformin 500mg  BID  Home glucose monitoring: none, will provide with new monitor next visit  Instruction/counseling given: reminded to get eye exam, reminded to bring medications to each visit, discussed foot care, discussed the need for weight loss, discussed diet and provided printed educational material  Educational resources provided: handout  Self management tools provided: home glucose logbook  Other: refer to [name] next visit

## 2012-04-20 NOTE — Assessment & Plan Note (Signed)
Creatinine has varried between around 1.25 to 1.5 over the past few years.  Increased from last check but within this range. No microalbuminuria is reassuring.  He is unable to take ACE inhibitors because of angioedema and it would not be wise to use an ARB either. We will continue monitoring and aggressively treating his diabetes and hypertension.

## 2012-05-05 ENCOUNTER — Other Ambulatory Visit: Payer: Self-pay | Admitting: *Deleted

## 2012-05-05 DIAGNOSIS — E785 Hyperlipidemia, unspecified: Secondary | ICD-10-CM

## 2012-05-05 MED ORDER — PRAVASTATIN SODIUM 80 MG PO TABS: 80.0000 mg | ORAL_TABLET | Freq: Every evening | ORAL | Status: DC

## 2012-05-13 ENCOUNTER — Ambulatory Visit (AMBULATORY_SURGERY_CENTER): Payer: Medicaid Other

## 2012-05-13 VITALS — Ht 72.0 in | Wt 345.8 lb

## 2012-05-13 DIAGNOSIS — Z1211 Encounter for screening for malignant neoplasm of colon: Secondary | ICD-10-CM

## 2012-05-13 DIAGNOSIS — Z83719 Family history of colon polyps, unspecified: Secondary | ICD-10-CM

## 2012-05-13 DIAGNOSIS — Z800 Family history of malignant neoplasm of digestive organs: Secondary | ICD-10-CM

## 2012-05-13 DIAGNOSIS — Z8371 Family history of colonic polyps: Secondary | ICD-10-CM

## 2012-05-13 MED ORDER — NA SULFATE-K SULFATE-MG SULF 17.5-3.13-1.6 GM/177ML PO SOLN: 1.0000 | Freq: Once | ORAL | Status: DC

## 2012-05-22 ENCOUNTER — Ambulatory Visit (HOSPITAL_BASED_OUTPATIENT_CLINIC_OR_DEPARTMENT_OTHER): Payer: Medicaid Other | Attending: Internal Medicine | Admitting: Radiology

## 2012-05-22 VITALS — Ht 72.0 in | Wt 350.0 lb

## 2012-05-22 DIAGNOSIS — G4733 Obstructive sleep apnea (adult) (pediatric): Secondary | ICD-10-CM | POA: Insufficient documentation

## 2012-05-25 DIAGNOSIS — R0609 Other forms of dyspnea: Secondary | ICD-10-CM

## 2012-05-25 DIAGNOSIS — I4949 Other premature depolarization: Secondary | ICD-10-CM

## 2012-05-25 DIAGNOSIS — R0989 Other specified symptoms and signs involving the circulatory and respiratory systems: Secondary | ICD-10-CM

## 2012-05-25 DIAGNOSIS — G4733 Obstructive sleep apnea (adult) (pediatric): Secondary | ICD-10-CM

## 2012-05-26 NOTE — Procedures (Cosign Needed)
NAMEJOHNNELL, LIOU               ACCOUNT NO.:  [id number]  MEDICAL RECORD NO.:  [id number]          PATIENT TYPE:  OUT  LOCATION:  SLEEP CENTER                 FACILITY:  [facility]  PHYSICIAN:  Chole Driver D. Maple Hudson, MD, FCCP, FACPDATE OF BIRTH:  02/11/1956  DATE OF STUDY:  05/22/2012                           NOCTURNAL POLYSOMNOGRAM  REFERRING PHYSICIAN:  ANDREW B WALLACE  INDICATION FOR STUDY:  Hypersomnia with sleep apnea.  EPWORTH SLEEPINESS SCORE:  8/24.  BMI 47.5, weight 350 pounds, height 72 inches, neck 18 inches.  MEDICATIONS:  Home medications were charted and reviewed.  SLEEP ARCHITECTURE:  Total sleep time 170.5 minutes with sleep efficiency 45.5%.  Stage I was 47.2%, stage II 52.8%, stages III and REM were absent.  Sleep latency 17 minutes.  REM latency NA.  Awake after sleep onset 180 minutes.  Arousal index 64.4.  Bedtime medication: Afrin nasal spray for nasal congestion.  Sleep was marked by frequent spontaneous wakings and sustained intervals of wakefulness throughout most of the night.  RESPIRATORY DATA:  Apnea-hypopnea index (AHI) 78.8 per hour.  A total of 224 events were scored including 56 obstructive apneas 165 hypopneas. Events were seen in all sleep positions, especially while supine.  He did not sustained enough sleep to meet protocol requirements for initiation of split protocol CPAP titration on this study night.  OXYGEN DATA:  Moderately loud snoring with oxygen desaturation to a nadir of 86% and mean oxygen saturation through the study of 92.1% on room air.  CARDIAC DATA:  Sinus rhythm with PACs and frequent PVCs including couplets and trigeminy.  MOVEMENT-PARASOMNIA:  A few incidental limb jerks were noted with little effect on sleep.  Bathroom x4.  IMPRESSIONS-RECOMMENDATIONS: 1. Markedly fragmented sleep with frequent waking throughout most of     the night and absence of stages III and REM.  This may be in     response to respiratory events but  may include a component of     insomnia separately. 2. Severe obstructive sleep apnea/hypopnea syndrome, AHI 78.8 per     hour.  Events were seen in all sleep positions, especially while     supine.  Moderately loud snoring with oxygen desaturation to a     nadir of 86% and mean oxygen saturation through the study of 92.1%     on room air. 3. He did not sustain enough sleep to meet protocol requirements for     initiation of split protocol, CPAP titration on this study night.     If appropriate, he can return for a dedicated CPAP titration study.  Heart rhythm was significant for frequent PVCs including couplets and trigeminy.     Octavion Mollenkopf D. Maple Hudson, MD, Tonny Bollman, FACP Diplomate, American Board of Sleep Medicine    CDY/MEDQ  D:  05/25/2012 16:32:48  T:  05/26/2012 04:50:11  Job:  [id number]

## 2012-05-28 ENCOUNTER — Encounter: Payer: Medicaid Other | Admitting: Internal Medicine

## 2012-05-28 ENCOUNTER — Telehealth: Payer: Self-pay | Admitting: Internal Medicine

## 2012-05-28 ENCOUNTER — Encounter: Payer: Self-pay | Admitting: *Deleted

## 2012-05-28 NOTE — Telephone Encounter (Signed)
Pt will come by [facility] (4th floor) on Thursday 1/30 to pick up sample of Suprep and copy of updated prep instructions

## 2012-06-05 ENCOUNTER — Encounter: Payer: Self-pay | Admitting: Internal Medicine

## 2012-06-05 ENCOUNTER — Ambulatory Visit (AMBULATORY_SURGERY_CENTER): Payer: Medicaid Other | Admitting: Internal Medicine

## 2012-06-05 VITALS — BP 112/69 | HR 86 | Temp 97.0°F | Resp 14 | Ht 72.0 in | Wt 345.0 lb

## 2012-06-05 DIAGNOSIS — Z1211 Encounter for screening for malignant neoplasm of colon: Secondary | ICD-10-CM

## 2012-06-05 LAB — GLUCOSE, CAPILLARY
Glucose-Capillary: 101 mg/dL — ABNORMAL HIGH (ref 70–99)
Glucose-Capillary: 92 mg/dL (ref 70–99)

## 2012-06-05 MED ORDER — SODIUM CHLORIDE 0.9 % IV SOLN: 500.0000 mL | INTRAVENOUS | Status: DC

## 2012-06-05 NOTE — Op Note (Signed)
[facility] [address] [place], [address]   COLONOSCOPY PROCEDURE REPORT  PATIENT: Jeremiah Conway, Jeremiah Conway  MR#: [id number] BIRTHDATE: 27-Aug-1955 , 56  yrs. old GENDER: Male ENDOSCOPIST: Iva Boop, MD, [facility] REFERRED XB:JYNWGN Wallace, MD PROCEDURE DATE:  06/05/2012 PROCEDURE:   Colonoscopy ASA CLASS:   Class III INDICATIONS:average risk screening. MEDICATIONS: propofol (Diprivan) 250mg  IV, MAC sedation, administered by CRNA, and These medications were titrated to patient response per physician's verbal order  DESCRIPTION OF PROCEDURE:   After the risks benefits and alternatives of the procedure were thoroughly explained, informed consent was obtained.  A digital rectal exam revealed no abnormalities of the rectum.   The LB CF-[id number]  endoscope was introduced through the anus and advanced to the cecum, which was identified by both the appendix and ileocecal valve. No adverse events experienced.   The quality of the prep was Suprep excellent The instrument was then slowly withdrawn as the colon was fully examined.      COLON FINDINGS: Mild diverticulosis was noted in the sigmoid colon. The colon mucosa was otherwise normal.   A right colon retroflexion was performed.  Retroflexed views revealed no abnormalities. The time to cecum=4 minutes 42 seconds.  Withdrawal time=11 minutes 23 seconds.  The scope was withdrawn and the procedure completed. COMPLICATIONS: There were no complications.  ENDOSCOPIC IMPRESSION: 1.   Mild diverticulosis was noted in the sigmoid colon 2.   The colon mucosa was otherwise normal with excellent prep  RECOMMENDATIONS: Repeat colonoscopy 10 years. (2024)   eSigned:  Iva Boop, MD, [facility] 06/05/2012 11:55 AM   cc: The Patient    and Gwynn Burly, MD

## 2012-06-05 NOTE — Progress Notes (Signed)
No complaints noted in the recovery room. Maw   

## 2012-06-05 NOTE — Progress Notes (Signed)
Patient did not experience any of the following events: a burn prior to discharge; a fall within the facility; wrong site/side/patient/procedure/implant event; or a hospital transfer or hospital admission upon discharge from the facility. (G8907) Patient did not have preoperative order for IV antibiotic SSI prophylaxis. (G8918)  

## 2012-06-05 NOTE — Patient Instructions (Addendum)
Your colonoscopy showed some diverticulosis but otherwise was normal and the prep was fine.  Next routine colonoscopy in about 10 years - 2024.  Thank you for choosing me and [facility].  Iva Boop, MD, [facility]  Discharge instructions given with verbal understanding. Handout on diverticulosis given. Resume previous medications. YOU HAD AN ENDOSCOPIC PROCEDURE TODAY AT THE [facility]: Refer to the procedure report that was given to you for any specific questions about what was found during the examination.  If the procedure report does not answer your questions, please call your gastroenterologist to clarify.  If you requested that your care partner not be given the details of your procedure findings, then the procedure report has been included in a sealed envelope for you to review at your convenience later.  YOU SHOULD EXPECT: Some feelings of bloating in the abdomen. Passage of more gas than usual.  Walking can help get rid of the air that was put into your GI tract during the procedure and reduce the bloating. If you had a lower endoscopy (such as a colonoscopy or flexible sigmoidoscopy) you may notice spotting of blood in your stool or on the toilet paper. If you underwent a bowel prep for your procedure, then you may not have a normal bowel movement for a few days.  DIET: Your first meal following the procedure should be a light meal and then it is ok to progress to your normal diet.  A half-sandwich or bowl of soup is an example of a good first meal.  Heavy or fried foods are harder to digest and may make you feel nauseous or bloated.  Likewise meals heavy in dairy and vegetables can cause extra gas to form and this can also increase the bloating.  Drink plenty of fluids but you should avoid alcoholic beverages for 24 hours.  ACTIVITY: Your care partner should take you home directly after the procedure.  You should plan to take it easy, moving slowly for the rest  of the day.  You can resume normal activity the day after the procedure however you should NOT DRIVE or use heavy machinery for 24 hours (because of the sedation medicines used during the test).    SYMPTOMS TO REPORT IMMEDIATELY: A gastroenterologist can be reached at any hour.  During normal business hours, 8:30 AM to 5:00 PM Monday through Friday, call [phone].  After hours and on weekends, please call the GI answering service at [phone] who will take a message and have the physician on call contact you.   Following lower endoscopy (colonoscopy or flexible sigmoidoscopy):  Excessive amounts of blood in the stool  Significant tenderness or worsening of abdominal pains  Swelling of the abdomen that is new, acute  Fever of 100F or higher FOLLOW UP: If any biopsies were taken you will be contacted by phone or by letter within the next 1-3 weeks.  Call your gastroenterologist if you have not heard about the biopsies in 3 weeks.  Our staff will call the home number listed on your records the next business day following your procedure to check on you and address any questions or concerns that you may have at that time regarding the information given to you following your procedure. This is a courtesy call and so if there is no answer at the home number and we have not heard from you through the emergency physician on call, we will assume that you have returned to your regular daily activities  without incident.  SIGNATURES/CONFIDENTIALITY: You and/or your care partner have signed paperwork which will be entered into your electronic medical record.  These signatures attest to the fact that that the information above on your After Visit Summary has been reviewed and is understood.  Full responsibility of the confidentiality of this discharge information lies with you and/or your care-partner.

## 2012-06-06 ENCOUNTER — Telehealth: Payer: Self-pay | Admitting: *Deleted

## 2012-06-06 NOTE — Telephone Encounter (Signed)
  Follow up Call-  Call back number 06/05/2012  Post procedure Call Back phone  # [phone]  Permission to leave phone message Yes     Patient questions:  Do you have a fever, pain , or abdominal swelling? no Pain Score  0 *  Have you tolerated food without any problems? yes  Have you been able to return to your normal activities? yes  Do you have any questions about your discharge instructions: Diet   no Medications  no Follow up visit  no  Do you have questions or concerns about your Care? no  Actions: * If pain score is 4 or above: No action needed, pain <4.

## 2012-07-02 ENCOUNTER — Ambulatory Visit (INDEPENDENT_AMBULATORY_CARE_PROVIDER_SITE_OTHER): Payer: Medicaid Other | Admitting: Internal Medicine

## 2012-07-02 ENCOUNTER — Encounter: Payer: Self-pay | Admitting: Internal Medicine

## 2012-07-02 VITALS — BP 137/91 | HR 97 | Temp 97.0°F | Ht 63.0 in | Wt 357.8 lb

## 2012-07-02 DIAGNOSIS — G4733 Obstructive sleep apnea (adult) (pediatric): Secondary | ICD-10-CM

## 2012-07-02 DIAGNOSIS — I10 Essential (primary) hypertension: Secondary | ICD-10-CM

## 2012-07-02 DIAGNOSIS — N400 Enlarged prostate without lower urinary tract symptoms: Secondary | ICD-10-CM

## 2012-07-02 DIAGNOSIS — G4730 Sleep apnea, unspecified: Secondary | ICD-10-CM

## 2012-07-02 DIAGNOSIS — E119 Type 2 diabetes mellitus without complications: Secondary | ICD-10-CM

## 2012-07-02 DIAGNOSIS — E785 Hyperlipidemia, unspecified: Secondary | ICD-10-CM

## 2012-07-02 DIAGNOSIS — Z87891 Personal history of nicotine dependence: Secondary | ICD-10-CM

## 2012-07-02 NOTE — Assessment & Plan Note (Signed)
Assessment:  Progress toward smoking cessation:  At goal, quit smoking  Barriers to progress toward smoking cessation:  none   Plan:  Instruction/counseling given:  I commended patient for quitting and reviewed strategies for preventing relapses.

## 2012-07-02 NOTE — Progress Notes (Signed)
  Subjective:    Patient ID: Jeremiah Conway, male    DOB: 12/07/1955, 57 y.o.   MRN: [id number]  HPI:  57 year old man with hypertension, diabetes, obesity, and former tobacco abuse who presents for followup and to check up. Quit smoking on February 11. Determined to get in shape. Has joined a gym and is going to begin a diet. Request a prostate exam today because "I'm of the age where these things need checked". No other symptoms.   Review of Systems  Constitutional: Negative.  Negative for fever and chills.  HENT: Negative.  Negative for ear pain, congestion and ear discharge.   Eyes: Negative.  Negative for visual disturbance.  Respiratory: Negative.  Negative for cough and shortness of breath.   Cardiovascular: Negative.  Negative for chest pain.  Gastrointestinal: Negative.  Negative for abdominal pain.  Endocrine: Negative.  Negative for polyuria.  Genitourinary: Negative.  Negative for dysuria and hematuria.  Skin: Negative for rash.  Neurological: Negative.  Negative for dizziness and headaches.       Objective:   Physical Exam: GENERAL: Obese; no acute distress HEAD: atraumatic, normocephalic EYES: pupils equal, round and reactive; sclera anicteric; normal conjunctiva EARS: canals patent and TMs normal bilaterally NOSE/THROAT: oropharynx clear, moist mucous membranes, pink gums, poor dentition NECK: supple, no carotid bruits, thyroid normal in size and without palpable nodules LYMPH: no cervical or supraclavicular lymphadenopathy LUNGS: clear to auscultation bilaterally, normal work of breathing HEART: normal rate and regular rhythm; normal S1 and S2 without S3 or S4; no murmurs, rubs, or clicks PULSES: radial 2+ and symmetric ABDOMEN: soft, non-tender, normal bowel sounds SKIN: warm, dry, intact, normal turgor, no rashes EXTREMITIES: no peripheral edema, clubbing, or cyanosis PSYCH: patient is alert and oriented, mood and affect are normal and congruent, thought content  is normal without delusions, thought process is linear, speech is normal and non-pressured, behavior is normal, judgement and insight are normal  Filed Vitals:   07/02/12 1519  BP: 137/91  Pulse: 97  Temp: 97 F (36.1 C)    BP Readings from Last 3 Encounters:  07/02/12 137/91  06/05/12 112/69  04/18/12 124/90          Assessment & Plan:

## 2012-07-02 NOTE — Assessment & Plan Note (Signed)
Lab Results  Component Value Date   HGBA1C 6.7 04/18/2012   HGBA1C 6.8 10/31/2011   HGBA1C 6.5 04/12/2011     Assessment:  Diabetes control: good control (HgbA1C at goal)  Progress toward A1C goal:  at goal  Plan:  Medications:  continue current medications  Instruction/counseling given: no instruction/counseling

## 2012-07-02 NOTE — Patient Instructions (Addendum)
General Instructions:  KEEP UP THE GOOD WORK!!    Treatment Goals:  Goals (1 Years of Data) as of 07/02/12         As of Today 06/05/12 06/05/12 06/05/12 06/05/12     Blood Pressure    . Blood Pressure < 130/80  137/91 112/69 90/59 131/75 134/92     Lifestyle    . Quit smoking / using tobacco  Yes        Progress Toward Treatment Goals:  Treatment Goal 07/02/2012  Hemoglobin A1C at goal  Blood pressure unchanged  Stop smoking at goal    Self Care Goals & Plans:  Self Care Goal 07/02/2012  Manage my medications take my medicines as prescribed; bring my medications to every visit; refill my medications on time  Monitor my health check my feet daily; keep track of my blood glucose  Eat healthy foods drink diet soda or water instead of juice or soda; eat more vegetables; eat foods that are low in salt  Be physically active find time in my schedule; find an activity I enjoy  Stop smoking -    Care Management & Community Referrals:  Referral 07/02/2012  Referrals made for care management support none needed

## 2012-07-02 NOTE — Assessment & Plan Note (Addendum)
Improved on Flomax. Wanted a prostate exam today. Prostate exam was normal. We discussed the risks and benefits of PSA screening. The risks being unnecessary tests, side effects of additional testing, and side effects of treatment. Benefits being detection and treatment of cancer. The patient decided to forego PSA testing today. Continue Flomax. - Continue Flomax

## 2012-07-02 NOTE — Assessment & Plan Note (Addendum)
BP Readings from Last 3 Encounters:  07/02/12 137/91  06/05/12 112/69  04/18/12 124/90    Lab Results  Component Value Date   NA 141 04/18/2012   K 3.8 04/18/2012   CREATININE 1.47* 04/18/2012    Assessment:  Blood pressure control: mildly elevated  Progress toward BP goal:  unchanged  Plan:  Medications:  continue current medications, Will order CPAP  Other: If he indeed loses weight, this will greatly help our efforts at controlling blood pressure. CPAP will also be of tremendous help.

## 2012-07-03 ENCOUNTER — Encounter: Payer: Self-pay | Admitting: Internal Medicine

## 2012-07-03 MED ORDER — ATORVASTATIN CALCIUM 80 MG PO TABS: 80.0000 mg | ORAL_TABLET | Freq: Every day | ORAL | Status: DC

## 2012-07-03 NOTE — Assessment & Plan Note (Signed)
Patient is very enthusiastic about losing weight and getting into shape. He is joining a gym and wants to start dieting. I offered a referral to [employer] for diet education and support. He declined at this time but will keep this option in mind.

## 2012-07-03 NOTE — Assessment & Plan Note (Addendum)
At his last visit, we increased his pravastatin to 80 mg. Using the risk calculator, it seems that with even optimal cholesterol and blood pressure control, we will be unable to get his 10 year CVD risk below 7-1/2%. As it stands now, his 10 year risk is 25%. I think he would benefit from a high-intensity statin therapy with atorvastatin 80 mg. I will have him finish his current bottle of pravastatin and then switched to atorvastatin. - Stop pravastatin - Begin atorvastatin 80 mg daily

## 2012-07-03 NOTE — Assessment & Plan Note (Addendum)
Sleep study on 05/22/2012. Apnea-hypoxia index was 78.8 per hour. Diagnosed with severe obstructive sleep apnea and hypopnea syndrome. They were unable to titrate CPAP. We will need to send him back over for this. I think this is his most pressing issue. His diabetes, hyperlipidemia, and hypertension are all being negatively impacted by this. The sooner we can get him a home CPAP the better. - Referral for CPAP titration

## 2012-07-11 ENCOUNTER — Encounter: Payer: Self-pay | Admitting: Internal Medicine

## 2012-08-18 ENCOUNTER — Ambulatory Visit (HOSPITAL_BASED_OUTPATIENT_CLINIC_OR_DEPARTMENT_OTHER): Payer: Medicaid Other | Attending: Internal Medicine

## 2012-08-18 VITALS — Ht 72.0 in | Wt 350.0 lb

## 2012-08-18 DIAGNOSIS — Z9989 Dependence on other enabling machines and devices: Secondary | ICD-10-CM

## 2012-08-18 DIAGNOSIS — G4733 Obstructive sleep apnea (adult) (pediatric): Secondary | ICD-10-CM | POA: Insufficient documentation

## 2012-08-18 DIAGNOSIS — G4730 Sleep apnea, unspecified: Secondary | ICD-10-CM

## 2012-08-18 DIAGNOSIS — G4710 Hypersomnia, unspecified: Secondary | ICD-10-CM | POA: Insufficient documentation

## 2012-08-23 DIAGNOSIS — G4710 Hypersomnia, unspecified: Secondary | ICD-10-CM

## 2012-08-23 DIAGNOSIS — G4730 Sleep apnea, unspecified: Secondary | ICD-10-CM

## 2012-08-24 NOTE — Procedures (Signed)
NAME:  KOLLEN, ARMENTI               ACCOUNT NO.:  [id number]  MEDICAL RECORD NO.:  [id number]          PATIENT TYPE:  OUT  LOCATION:  SLEEP CENTER                 FACILITY:  [facility]  PHYSICIAN:  Clinton D. Maple Hudson, MD, FCCP, FACPDATE OF BIRTH:  06-02-1955  DATE OF STUDY:  08/18/2012                           NOCTURNAL POLYSOMNOGRAM  REFERRING PHYSICIAN:  ANDREW B WALLACE  INDICATION FOR STUDY:  Hypersomnia with obstructive sleep apnea.  EPWORTH SLEEPINESS SCORE:  10/24.  BMI 47.5, weight 350 pounds, height 72 inches, neck 18 inches.  MEDICATIONS:  Home medications are charted and reviewed.  A baseline diagnostic NPSG on May 22, 2012, had recorded an AHI of 78.8 per hour.  Body weight was 350 pounds.  CPAP titration is now requested.  SLEEP ARCHITECTURE:  Total sleep time 340.5 minutes with sleep efficiency 93.7%.  Stage I was 10.4%, stage II 62.7%, stage III absent. REM 26.9% of total sleep time.  Sleep latency 6.5 minutes, REM latency 55.5 minutes, awake after sleep onset 16.5 minutes.  Arousal index 9.0.  Bedtime medication:  None.  RESPIRATORY DATA:  CPAP titration protocol.  CPAP was titrated to 17 CWP, with residual AHI of 13.8 per hour.  Technician noted no improvement in oxygenation or snoring at that pressure.  At that point, he was switched to bilevel (BiPAP) and titrated to a final pressure, inspiratory 24, and expiratory 20 CWP with AHI 0 per hour.  He wore a large ResMed Quattro full-face mask with heated humidifier and indicated he felt better than usual on waking in the morning.  OXYGEN DATA:  Snoring was prevented by final BiPAP settings and mean oxygen saturation through the titration study spelled 87.1% on room air without supplementation.  It appears on the monitoring that saturation was holding close to 90% on final BiPAP pressures.  CARDIAC DATA:  Sinus rhythm with what appeared to be multifocal PVCs.  MOVEMENT-PARASOMNIA:  A total of 67 limb jerks were  counted of which only 3 were associated with arousals or awakenings for periodic limb movements with arousal index of 0.5 per hour.  No bathroom trips.  IMPRESSIONS-RECOMMENDATIONS: 1. CPAP titration did not provide adequate control up to 17 CWP.  The     patient was successfully titrated to a final bilevel (BiPAP),     inspiratory 24 CWP, and expiratory 20 CWP with AHI 0 per hour.  He     wore a large [employer] pl[employer] full-face mask with heated humidifier. 2. Baseline diagnostic NPSG on May 22, 2012, it recorded AHI 78.8     per hour with weight 350 pounds.     Clinton D. Maple Hudson, MD, [facility], FACP Diplomate, American Board of Sleep Medicine    CDY/MEDQ  D:  08/23/2012 08:46:35  T:  08/24/2012 05:22:16  Job:  [id number]

## 2012-08-25 ENCOUNTER — Encounter: Payer: Self-pay | Admitting: Internal Medicine

## 2012-09-05 ENCOUNTER — Encounter: Payer: Self-pay | Admitting: Internal Medicine

## 2012-09-05 ENCOUNTER — Ambulatory Visit (INDEPENDENT_AMBULATORY_CARE_PROVIDER_SITE_OTHER): Payer: Medicaid Other | Admitting: Internal Medicine

## 2012-09-05 VITALS — BP 127/86 | HR 88 | Temp 97.0°F | Ht 72.0 in | Wt 359.6 lb

## 2012-09-05 DIAGNOSIS — G4733 Obstructive sleep apnea (adult) (pediatric): Secondary | ICD-10-CM

## 2012-09-05 DIAGNOSIS — E1129 Type 2 diabetes mellitus with other diabetic kidney complication: Secondary | ICD-10-CM

## 2012-09-05 DIAGNOSIS — N183 Chronic kidney disease, stage 3 unspecified: Secondary | ICD-10-CM

## 2012-09-05 DIAGNOSIS — N058 Unspecified nephritic syndrome with other morphologic changes: Secondary | ICD-10-CM

## 2012-09-05 DIAGNOSIS — I10 Essential (primary) hypertension: Secondary | ICD-10-CM

## 2012-09-05 DIAGNOSIS — Z6841 Body Mass Index (BMI) 40.0 and over, adult: Secondary | ICD-10-CM

## 2012-09-05 LAB — COMPREHENSIVE METABOLIC PANEL
AST: 11 U/L (ref 0–37)
Alkaline Phosphatase: 71 U/L (ref 39–117)
BUN: 16 mg/dL (ref 6–23)
Calcium: 10.2 mg/dL (ref 8.4–10.5)
Creat: 1.32 mg/dL (ref 0.50–1.35)
Total Bilirubin: 0.6 mg/dL (ref 0.3–1.2)

## 2012-09-05 LAB — CBC
HCT: 43.5 % (ref 39.0–52.0)
MCH: 26.8 pg (ref 26.0–34.0)
MCHC: 33.1 g/dL (ref 30.0–36.0)
MCV: 80.9 fL (ref 78.0–100.0)
Platelets: 227 10*3/uL (ref 150–400)
RDW: 15.7 % — ABNORMAL HIGH (ref 11.5–15.5)

## 2012-09-05 LAB — POCT GLYCOSYLATED HEMOGLOBIN (HGB A1C): Hemoglobin A1C: 8.2

## 2012-09-05 MED ORDER — METFORMIN HCL 1000 MG PO TABS: 500.0000 mg | ORAL_TABLET | Freq: Two times a day (BID) | ORAL | Status: DC

## 2012-09-05 NOTE — Patient Instructions (Addendum)
We will increase Metformin ( Diabetes medication) from 500 mg to 1000 mg twice a day. The prescription is send to the pharmacy  Please check your blood sugars every day before breakfast .  Start exercise and the diet changes we talked about. Please call us if you have any questions

## 2012-09-05 NOTE — Assessment & Plan Note (Addendum)
Diabetes control has deteriorated. Hemoglobin A1c today is 8.2. Discussed at length about the importance good glycemic control as well as about diet control and exercise. Patient is reluctant to start insulin therapy at this point. I will increase metformin 1000 g twice a day.patient further was instructed to check his blood sugars on a daily basis before breakfast. He has no meter but will try to get one.  He needs good control of his diabetes in the setting of CKD. If it's her renal function is further declining we may be no longer able to use metformin. I'll obtain a basic metabolic panel today

## 2012-09-05 NOTE — Assessment & Plan Note (Addendum)
Blood pressure at goal. We'll continue current regimen with Maxzide 37.5/25 mg daily, Norvasc 10 mg daily Atenolol 50 mg daily. BP Readings from Last 3 Encounters:  09/05/12 127/86  07/02/12 137/91  06/05/12 112/69

## 2012-09-05 NOTE — Assessment & Plan Note (Signed)
An order was placed for BiPAP.

## 2012-09-05 NOTE — Assessment & Plan Note (Addendum)
I discussed in length about exercise and diet changes. Brochures were handed out. He wanted to sign out at the [facility]. He wanted to hold off on referral to nutritionist

## 2012-09-05 NOTE — Progress Notes (Signed)
Subjective:   Patient ID: Jeremiah Conway male   DOB: 1955/09/07 57 y.o.   MRN: [id number]  HPI: [name] is a 57 y.o. male with past medical history significant as outlined below who presented to the clinic to get an order for a CPAP machine. Patient had sleep study performed which showed that he will need a Bipap.  Patient feels great. Has no complains today   Diabetes: patient is currently taking metformin 5 mg twice a day. He currently does not do any exercise at all. His usual food intake consisted of fried food including french fries, chicken nuggets, progressive as well as chips and sodas     Past Medical History  Diagnosis Date  . Diabetes mellitus   . Hyperlipidemia   . Hypertension   . Obesity   . Unspecified venous (peripheral) insufficiency   . Hypokalemia   . Angioedema   . Allergic rhinitis   . History of hematuria 11/20/2007  . RBBB (right bundle branch block with left anterior fascicular block)   . Tobacco abuse     Stopped in 2002. Now smokes 1.5ppd  . OSA on CPAP   . Pericarditis 1988  . Renal insufficiency     Cr 1.2 baseline   Current Outpatient Prescriptions  Medication Sig Dispense Refill  . amLODipine (NORVASC) 10 MG tablet Take 1 tablet (10 mg total) by mouth daily.  30 tablet  11  . aspirin 81 MG tablet Take 1 tablet (81 mg total) by mouth daily.  30 tablet  11  . atenolol (TENORMIN) 50 MG tablet Take 1 tablet (50 mg total) by mouth daily.  30 tablet  11  . atorvastatin (LIPITOR) 80 MG tablet Take 1 tablet (80 mg total) by mouth daily.  30 tablet  11  . Elastic Bandages & Supports (V-5 HIGH COMPRESSION HOSE) MISC 1pair, Wear them while driving, walking, standing  1 each  1  . glucose blood test strip 1 each by Other route as needed. Use as instructed       . metFORMIN (GLUCOPHAGE) 1000 MG tablet Take 0.5 tablets (500 mg total) by mouth 2 (two) times daily with a meal.  60 tablet  2  . oxymetazoline (AFRIN) 0.05 % nasal spray Place 2 sprays  into the nose as needed.      . potassium chloride (KLOR-CON) 10 MEQ CR tablet Take 10 mEq by mouth daily.        . Tamsulosin HCl (FLOMAX) 0.4 MG CAPS Take 1 capsule (0.4 mg total) by mouth daily after breakfast.  30 capsule  11  . triamterene-hydrochlorothiazide (MAXZIDE-25) 37.5-25 MG per tablet Take 1 each (1 tablet total) by mouth daily.  30 tablet  11   No current facility-administered medications for this visit.   Family History  Problem Relation Age of Onset  . Diabetes Father   . Heart disease Father   . Colon cancer Maternal Grandfather   . Colon polyps Maternal Grandfather   . Ovarian cancer Maternal Aunt    History   Social History  . Marital Status: Divorced    Spouse Name: N/A    Number of Children: N/A  . Years of Education: N/A   Social History Main Topics  . Smoking status: Former Smoker -- 0.50 packs/day    Types: Cigarettes    Quit date: 06/10/2012  . Smokeless tobacco: Never Used     Comment: given quit line info  . Alcohol Use: No  . Drug Use: No  .  Sexually Active: None   Other Topics Concern  . None   Social History Narrative   Works as a Naval architect.   Smokes 1.5 ppd.    Review of Systems: Constitutional: Denies fever, chills, diaphoresis, appetite change and fatigue.  Cardiovascular: Denies chest pain, palpitations and leg swelling.  Gastrointestinal: Denies nausea, vomiting, abdominal pain, diarrhea, constipation, blood in stool and abdominal distention.  Genitourinary: Denies dysuria, urgency, frequency, hematuria, flank pain and difficulty urinating.   Skin: Denies pallor, rash and wound.  Neurological: Denies dizziness  Objective:  Physical Exam: Filed Vitals:   09/05/12 1039 09/05/12 1057  BP: 154/98 127/86  Pulse: 90 88  Temp: 97 F (36.1 C)   TempSrc: Oral   Height: 6' (1.829 m)   Weight: 359 lb 9.6 oz (163.113 kg)   SpO2: 94%    Constitutional: Vital signs reviewed.  Patient is a morbid obese male  in no acute distress and  cooperative with exam. Alert and oriented x3.  Eyes: PERRL, EOMI, conjunctivae normal, No scleral icterus.  Neck: Supple,  Cardiovascular: RRR, S1 normal, S2 normal, no MRG, pulses symmetric and intact bilaterally Pulmonary/Chest: CTAB, no wheezes, rales, or rhonchi Abdominal: Soft. Non-tender, bowel sounds are normal   Hematology: no cervical,  adenopathy.  Neurological: A&O x3,  Skin: Warm, dry and intact. No rash, cyanosis, or clubbing.

## 2012-09-08 NOTE — Addendum Note (Signed)
Addended by: Angelina Ok F on: 09/08/2012 04:27 PM   Modules accepted: Orders

## 2012-10-06 ENCOUNTER — Ambulatory Visit: Payer: Medicaid Other | Admitting: Internal Medicine

## 2012-10-07 ENCOUNTER — Other Ambulatory Visit: Payer: Self-pay | Admitting: *Deleted

## 2012-10-07 DIAGNOSIS — I10 Essential (primary) hypertension: Secondary | ICD-10-CM

## 2012-10-07 MED ORDER — AMLODIPINE BESYLATE 10 MG PO TABS: 10.0000 mg | ORAL_TABLET | Freq: Every day | ORAL | Status: DC

## 2012-10-17 ENCOUNTER — Telehealth: Payer: Self-pay | Admitting: *Deleted

## 2012-10-17 ENCOUNTER — Ambulatory Visit (INDEPENDENT_AMBULATORY_CARE_PROVIDER_SITE_OTHER): Payer: Medicaid Other | Admitting: Internal Medicine

## 2012-10-17 VITALS — BP 128/83 | HR 97 | Temp 98.1°F | Wt 337.7 lb

## 2012-10-17 DIAGNOSIS — N058 Unspecified nephritic syndrome with other morphologic changes: Secondary | ICD-10-CM

## 2012-10-17 DIAGNOSIS — E1129 Type 2 diabetes mellitus with other diabetic kidney complication: Secondary | ICD-10-CM

## 2012-10-17 DIAGNOSIS — I10 Essential (primary) hypertension: Secondary | ICD-10-CM

## 2012-10-17 DIAGNOSIS — Z6841 Body Mass Index (BMI) 40.0 and over, adult: Secondary | ICD-10-CM

## 2012-10-17 MED ORDER — METFORMIN HCL 1000 MG PO TABS: 1000.0000 mg | ORAL_TABLET | Freq: Two times a day (BID) | ORAL | Status: DC

## 2012-10-17 MED ORDER — GLIPIZIDE 5 MG PO TABS: 5.0000 mg | ORAL_TABLET | Freq: Two times a day (BID) | ORAL | Status: DC

## 2012-10-17 MED ORDER — GLUCOSE BLOOD VI STRP
ORAL_STRIP | Status: DC
Start: 2012-10-17 — End: 2013-01-14

## 2012-10-17 MED ORDER — INSULIN ASPART 100 UNIT/ML ~~LOC~~ SOLN
5.0000 [IU] | Freq: Once | SUBCUTANEOUS | Status: AC
  Administered 2012-10-17: 5 [IU] via SUBCUTANEOUS

## 2012-10-17 MED ORDER — ACCU-CHEK SOFT TOUCH LANCETS MISC: Status: DC

## 2012-10-17 NOTE — Progress Notes (Signed)
This is a Psychologist, occupational Note.  The care of the patient was discussed with Dr. Rogelia Boga and the assessment and plan was formulated with their assistance.  Please see their note for official documentation of the patient encounter.   Subjective:   Patient ID: Jeremiah Conway male   DOB: [birth year] 57 y.o.   MRN: [id number]  HPI: Jeremiah Conway is a 57 y.o. male with a history of obesity and diabetes presenting for blurry vision. Patient has experienced blurry vision for the past 4 days. He reports blurry words on glove box ~8 feet away. He also endorses excessive thirst and polyuria during this period. His mother has noted the patient to be more tired and falling asleep during the day while watching TV. He otherwise denies loss of consciousness or poor mentation. The patient denies further changes in bowel or bladder habits. He is also confused about his metformin today, as he was told that it would be increased on his previous visit but seems to be the same dose. He reports a few indiscretions with his diet recently, including a sweet potato pie that was eaten in 3 days.   Past Medical History  Diagnosis Date  . Diabetes mellitus   . Hyperlipidemia   . Hypertension   . Obesity   . Unspecified venous (peripheral) insufficiency   . Hypokalemia   . Angioedema   . Allergic rhinitis   . History of hematuria 11/20/2007  . RBBB (right bundle branch block with left anterior fascicular block)   . Tobacco abuse     Stopped in 2002. Now smokes 1.5ppd  . OSA on CPAP   . Pericarditis 1988  . Renal insufficiency     Cr 1.2 baseline   Current Outpatient Prescriptions  Medication Sig Dispense Refill  . amLODipine (NORVASC) 10 MG tablet Take 1 tablet (10 mg total) by mouth daily.  30 tablet  11  . aspirin 81 MG tablet Take 1 tablet (81 mg total) by mouth daily.  30 tablet  11  . atenolol (TENORMIN) 50 MG tablet Take 1 tablet (50 mg total) by mouth daily.  30 tablet  11  . atorvastatin  (LIPITOR) 80 MG tablet Take 1 tablet (80 mg total) by mouth daily.  30 tablet  11  . Elastic Bandages & Supports (V-5 HIGH COMPRESSION HOSE) MISC 1pair, Wear them while driving, walking, standing  1 each  1  . glucose blood test strip Test sugar twice a day - AM fasting and 2 hours after largest meal  100 each  2  . metFORMIN (GLUCOPHAGE) 1000 MG tablet Take 1 tablet (1,000 mg total) by mouth 2 (two) times daily with a meal.  60 tablet  2  . Tamsulosin HCl (FLOMAX) 0.4 MG CAPS Take 1 capsule (0.4 mg total) by mouth daily after breakfast.  30 capsule  11  . triamterene-hydrochlorothiazide (MAXZIDE-25) 37.5-25 MG per tablet Take 1 each (1 tablet total) by mouth daily.  30 tablet  11  . glipiZIDE (GLUCOTROL) 5 MG tablet Take 1 tablet (5 mg total) by mouth 2 (two) times daily.  60 tablet  2  . Lancets (ACCU-CHEK SOFT TOUCH) lancets Use as instructed  100 each  12  . oxymetazoline (AFRIN) 0.05 % nasal spray Place 2 sprays into the nose as needed.      . potassium chloride (KLOR-CON) 10 MEQ CR tablet Take 10 mEq by mouth daily.         No current facility-administered medications for  this visit.   Family History  Problem Relation Age of Onset  . Diabetes Father   . Heart disease Father   . Colon cancer Maternal Grandfather   . Colon polyps Maternal Grandfather   . Ovarian cancer Maternal Aunt    History   Social History  . Marital Status: Divorced    Spouse Name: N/A    Number of Children: N/A  . Years of Education: N/A   Social History Main Topics  . Smoking status: Former Smoker -- 0.50 packs/day    Types: Cigarettes    Quit date: 06/10/2012  . Smokeless tobacco: Never Used     Comment: given quit line info  . Alcohol Use: No  . Drug Use: No  . Sexually Active: None   Other Topics Concern  . None   Social History Narrative   Works as a [employer] architect.   Smokes 1.5 ppd.    Review of Systems: A comprehensive 12 point review of systems was performed and is negative except as  stated above. Objective:  Physical Exam: Filed Vitals:   10/17/12 1457  BP: 128/83  Pulse: 97  Temp: 98.1 F (36.7 C)  TempSrc: Oral  Weight: 337 lb 11.2 oz (153.18 kg)  SpO2: 97%   General appearance: alert, cooperative and no distress Head: Normocephalic, without obvious abnormality, atraumatic Eyes: sclera anicteric. no scleral/conjunctival injection. Throat: moist mucus membranes of oropharynx. Neck: no adenopathy, supple, symmetrical, trachea midline and thyroid not enlarged, symmetric, no tenderness/mass/nodules Lungs: clear to auscultation bilaterally and normal work of breathing. Heart: regular rate and rhythm, S1, S2 normal, no murmur, click, rub or gallop Abdomen: obese, soft, nontender. bowel sounds present Extremities: no cyanosis. 1+ pitting edema to mid shin bilaterally. Pulses: 2+ and symmetric Assessment & Plan:  Jeremiah Conway is a 57 y.o. male with a history of diabetes presenting with blurry vision during a hyperglycemic episode.  DM (diabetes mellitus) type II controlled with renal manifestation Lab Results  Component Value Date   HGBA1C 8.2 09/05/2012   HGBA1C 6.7 04/18/2012   HGBA1C 6.8 10/31/2011    Office Visit on 10/17/2012  Component Date Value Range Status  . Glucose-Capillary 10/17/2012 529* 70 - 99 mg/dL Final  . Comment 1 11/91/4782 Call MD NNP PA CNM   Final    Assessment: Diabetes control:  uncontrolled Progress toward A1C goal:   N/A Comments: Patient had not increased his dose of metformin since last visit 6 weeks ago due to mislabeling of the bottle. He also presents today in hyperglycemia episode of 4 days duration with blurry vision but mentating appropriately. It is unlikely that he is in DKA due to his mentation.  Plan: Medications:  Metformin 1000mg  BID with meals. Begin glipizide 5mg  BID with meals. Discontinue glipizide if morning sugars <120. Home glucose monitoring: Frequency:  Twice a day Timing:  AM fasting, 2 hours post  prandial for one meal Instruction/counseling given: reminded to bring blood glucose meter & log to each visit, reminded to bring medications to each visit, discussed the need for weight loss and discussed diet Educational resources provided: brochure Self management tools provided:   Other plans: Patient will receive a follow up phone call in 2 weeks from diabetes educator. He will obtain a glucometer and begin recording his daily blood sugars. He has set a goal to eat more vegetables and fruits while refraining from fruits canned in simple syrups.      FOLLOW UP: Return visit in 2 months. Patient will have  follow up phone call with [facility] in 2 weeks.

## 2012-10-17 NOTE — Patient Instructions (Addendum)
You were seen in clinic today for blurry vision.  Your blurry vision is likely due to your very high sugars today. We gave you a dose of insulin in the clinic to lower your blood sugars. We have updated you metformin prescription to the following: Metformin 1000mg  1 pill twice a day with meals.  We are also starting you on another medication for your diabetes called glipizide. Take this medication twice a day with meals. If your morning blood sugars are less than 120, then you can stop taking this medication. We would also like for you to begin checking your sugars. We have sent in a prescription for blood glucose meter (glucometer) and the associated testing strips and lances.  Please begin to check your blood sugars regularly and use the log book that you were given today. Please check your sugars twice a day, once in the morning before breakfast and again 2 hours after your biggest meal of the day.  Our diabetes education will contact you in 2 weeks to see how you are doing. Please schedule a follow up appointment for August.   Blood Sugar Monitoring, Adult GLUCOSE METERS FOR SELF-MONITORING OF BLOOD GLUCOSE  It is important to be able to correctly measure your blood sugar (glucose). You can use a blood glucose monitor (a small battery-operated device) to check your glucose level at any time. This allows you and your caregiver to monitor your diabetes and to determine how well your treatment plan is working. The process of monitoring your blood glucose with a glucose meter is called self-monitoring of blood glucose (SMBG). When people with diabetes control their blood sugar, they have better health. To test for glucose with a typical glucose meter, place the disposable strip in the meter. Then place a small sample of blood on the "test strip." The test strip is coated with chemicals that combine with glucose in blood. The meter measures how much glucose is present. The meter displays the glucose  level as a number. Several new models can record and store a number of test results. Some models can connect to personal computers to store test results or print them out.  Newer meters are often easier to use than older models. Some meters allow you to get blood from places other than your fingertip. Some new models have automatic timing, error codes, signals, or barcode readers to help with proper adjustment (calibration). Some meters have a large display screen or spoken instructions for people with visual impairments.  INSTRUCTIONS FOR USING GLUCOSE METERS  Wash your hands with soap and warm water, or clean the area with alcohol. Dry your hands completely.  Prick the side of your fingertip with a lancet (a sharp-pointed tool used by hand).  Hold the hand down and gently milk the finger until a small drop of blood appears. Catch the blood with the test strip.  Follow the instructions for inserting the test strip and using the SMBG meter. Most meters require the meter to be turned on and the test strip to be inserted before applying the blood sample.  Record the test result.  Read the instructions carefully for both the meter and the test strips that go with it. Meter instructions are found in the user manual. Keep this manual to help you solve any problems that may arise. Many meters use "error codes" when there is a problem with the meter, the test strip, or the blood sample on the strip. You will need the manual to understand these  error codes and fix the problem.  New devices are available such as laser lancets and meters that can test blood taken from "alternative sites" of the body, other than fingertips. However, you should use standard fingertip testing if your glucose changes rapidly. Also, use standard testing if:  You have eaten, exercised, or taken insulin in the past 2 hours.  You think your glucose is low.  You tend to not feel symptoms of low blood glucose  (hypoglycemia).  You are ill or under stress.  Clean the meter as directed by the manufacturer.  Test the meter for accuracy as directed by the manufacturer.  Take your meter with you to your caregiver's office. This way, you can test your glucose in front of your caregiver to make sure you are using the meter correctly. Your caregiver can also take a sample of blood to test using a routine lab method. If values on the glucose meter are close to the lab results, you and your caregiver will see that your meter is working well and you are using good technique. Your caregiver will advise you about what to do if the results do not match. FREQUENCY OF TESTING  Your caregiver will tell you how often you should check your blood glucose. This will depend on your type of diabetes, your current level of diabetes control, and your types of medicines. The following are general guidelines, but your care plan may be different. Record all your readings and the time of day you took them for review with your caregiver.   Diabetes type 2.  Guidelines for SMBG in diabetes type 2 are not as well defined.  If you are on insulin, follow the guidelines above.  If you are on medicines, but not insulin, and your glucose is not well controlled, you should test at least twice daily.  If you are not on insulin, and your diabetes is controlled with medicines or diet alone, you should test at least once daily, usually before breakfast.  A weekly profile will help your caregiver advise you on your care plan. The week before your visit, check your glucose before a meal and 2 hours after a meal at least daily. You may want to test before and after a different meal each day so you and your caregiver can tell how well controlled your blood sugars are throughout the course of a 24 hour period.  Gestational diabetes (diabetes during pregnancy).  Frequent testing is often necessary. Accurate timing is important.  General  guidelines.  More frequent testing is required at the start of insulin treatment. Your caregiver will instruct you.  Test your glucose any time you suspect you have low blood sugar (hypoglycemia).  You should test more often when you change medicines, when you have unusual stress or illness, or in other unusual circumstances. OTHER THINGS TO KNOW ABOUT GLUCOSE METERS  Measurement Range. Most glucose meters are able to read glucose levels over a broad range of values from as low as 0 to as high as 600 mg/dL. If you get an extremely high or low reading from your meter, you should first confirm it with another reading. Report very high or very low readings to your caregiver.  Whole Blood Glucose versus Plasma Glucose. Some older home glucose meters measure glucose in your whole blood. In a lab or when using some newer home glucose meters, the glucose is measured in your plasma (one component of blood). The difference can be important. It is important for  you and your caregiver to know whether your meter gives its results as "whole blood equivalent" or "plasma equivalent."  Display of High and Low Glucose Values. Part of learning how to operate a meter is understanding what the meter results mean. Know how high and low glucose concentrations are displayed on your meter.  Factors that Affect Glucose Meter Performance. The accuracy of your test results depends on many factors and varies depending on the brand and type of meter. These factors include:  Low red blood cell count (anemia).  Substances in your blood (such as uric acid, vitamin C, and others).  Environmental factors (temperature, humidity, altitude).  Name-brand versus generic test strips.  Calibration. Make sure your meter is set up properly. It is a good idea to do a calibration test with a control solution recommended by the manufacturer of your meter whenever you begin using a fresh bottle of test strips. This will help verify the  accuracy of your meter.  Improperly stored, expired, or defective test strips. Keep your strips in a dry place with the lid on.  Soiled meter.  Inadequate blood sample. NEW TECHNOLOGIES FOR GLUCOSE TESTING Alternative site testing Some glucose meters allow testing blood from alternative sites. These include the:  Upper arm.  Forearm.  Base of the thumb.  Thigh. Sampling blood from alternative sites may be desirable. However, it may have some limitations. Blood in the fingertips show changes in glucose levels more quickly than blood in other parts of the body. This means that alternative site test results may be different from fingertip test results, not because of the meter's ability to test accurately, but because the actual glucose concentration can be different.  Continuous Glucose Monitoring Devices to measure your blood glucose continuously are available, and others are in development. These methods can be more expensive than self-monitoring with a glucose meter. However, it is uncertain how effective and reliable these devices are. Your caregiver will advise you if this approach makes sense for you. IF BLOOD SUGARS ARE CONTROLLED, PEOPLE WITH DIABETES REMAIN HEALTHIER.  SMBG is an important part of the treatment plan of patients with diabetes mellitus. Below are reasons for using SMBG:   It confirms that your glucose is at a specific, healthy level.  It detects hypoglycemia and severe hyperglycemia.  It allows you and your caregiver to make adjustments in response to changes in lifestyle for individuals requiring medicine.  It determines the need for starting insulin therapy in temporary diabetes that happens during pregnancy (gestational diabetes). Document Released: 04/19/2003 Document Revised: 07/09/2011 Document Reviewed: 08/10/2010 [facility] Patient Information 2014 [place], [place].

## 2012-10-17 NOTE — Telephone Encounter (Signed)
Pt called with c/o blurred vision and increase thirst for last 4 days.   He feels its because he increased his metformin.  When questioned he states he is taking metformin 500 mg bid.  This is not a change from the past.  On last appointment 5/9  Pt was to increase metformin to 1000 mg bid.  The Rx was sent in but directions were not changed.   Pt scheduled for today so meds can be reviewed and increase metformin after evaluation.  Not sure pt is clear with instructions.  Will see at 1500 today

## 2012-10-17 NOTE — Assessment & Plan Note (Addendum)
Lab Results  Component Value Date   HGBA1C 8.2 09/05/2012   HGBA1C 6.7 04/18/2012   HGBA1C 6.8 10/31/2011    Office Visit on 10/17/2012  Component Date Value Range Status  . Glucose-Capillary 10/17/2012 529* 70 - 99 mg/dL Final  . Comment 1 19/14/7829 Call MD NNP PA CNM   Final    Assessment: Diabetes control:  uncontrolled Progress toward A1C goal:   N/A Comments: Patient had not increased his dose of metformin since last visit 6 weeks ago due to mislabeling of the bottle. He also presents today in hyperglycemia episode of 4 days duration with blurry vision but mentating appropriately. It is unlikely that he is in DKA due to his mentation.  Plan: Medications:  Metformin 1000mg  BID with meals. Begin glipizide 5mg  BID with meals. Discontinue glipizide if morning sugars <120. Home glucose monitoring: Frequency:  Twice a day Timing:  AM fasting, 2 hours post prandial for one meal Instruction/counseling given: reminded to bring blood glucose meter & log to each visit, reminded to bring medications to each visit, discussed the need for weight loss and discussed diet Educational resources provided: brochure Self management tools provided:   Other plans: Patient will receive a follow up phone call in 2 weeks from diabetes educator. He will obtain a glucometer and begin recording his daily blood sugars. He has set a goal to eat more vegetables and fruits while refraining from fruits canned in simple syrups.   Attending A&P Poorly controlled DM 2/2 medication error. Pt understands to increase to 1000 BID. Will also add glucotrol to get better glycemic control faster. Pt willing to use but has been on in past and it was changed t metformin 2/2 hypoglycemia. Educated pt on goal CBG's and attempted to empower him to ask MD to stop it once fasting CBG's are getting close to goal. He understands diabetic diet and has met with [name]. We sent in Rx for home CBG monitoring.

## 2012-10-19 NOTE — Assessment & Plan Note (Signed)
losing weight but could be 2/2 hypergycemia. Pt has had mtgs with [name]

## 2012-10-19 NOTE — Assessment & Plan Note (Signed)
BP Readings from Last 3 Encounters:  10/17/12 128/83  09/05/12 127/86  07/02/12 137/91    Lab Results  Component Value Date   NA 140 09/05/2012   K 4.1 09/05/2012   CREATININE 1.32 09/05/2012    Assessment: Blood pressure control:   Progress toward BP goal:    Comments: At goal  Plan: Medications:  continue current medications Educational resources provided: brochure Self management tools provided:   Other plans: Cont current meds

## 2012-10-19 NOTE — Progress Notes (Signed)
  Subjective:    Patient ID: Jeremiah Conway, male    DOB: 03-11-1956, 57 y.o.   MRN: [id number]  Diabetes Associated symptoms include polydipsia and polyuria.    [name] is a 57 yo male who was seen by Dr Loistine Chance on 5/9. At that time, he was on Metformin 500 BID and had a poor diet. She noted that his A1C had increased to 8.2 , instructed him on a diabetic diet, and increased his metformin to 1000 BID. However, there was an error in his Rx and it was sent as 1000mg  pills 1/2 BID so he remained on the same dose. He called the clinic and c/o increased thirst and blurry vision for 4 days and was given a same day appt. CBG random was 529 today, up from 221 on 5/9. Other assoc sxs include polyuria and fatigue but he denies decreased mentation and anorexia.   Review of Systems  Constitutional: Positive for activity change and unexpected weight change. Negative for appetite change.  Eyes: Positive for visual disturbance.  Endocrine: Positive for polydipsia and polyuria.  Genitourinary: Negative for decreased urine volume.  Musculoskeletal: Negative for gait problem.       Objective:   Physical Exam  Constitutional: He is oriented to person, place, and time. He appears well-developed and well-nourished. No distress.  HENT:  Head: Normocephalic and atraumatic.  Right Ear: External ear normal.  Left Ear: External ear normal.  Nose: Nose normal.  Eyes: Conjunctivae and EOM are normal. Right eye exhibits no discharge. Left eye exhibits no discharge. No scleral icterus.  Pulmonary/Chest: Effort normal.  Musculoskeletal: Normal range of motion.  Neurological: He is alert and oriented to person, place, and time.  Skin: Skin is warm and dry. He is not diaphoretic.  Psychiatric: He has a normal mood and affect. His behavior is normal. Judgment and thought content normal.          Assessment & Plan:

## 2012-10-21 ENCOUNTER — Telehealth: Payer: Self-pay | Admitting: *Deleted

## 2012-10-21 MED ORDER — FREESTYLE SYSTEM KIT: 1.0000 | PACK | Status: DC | PRN

## 2012-10-21 NOTE — Telephone Encounter (Signed)
Pt called - no meter at pharmacy. Lancets were for Accu-chek were sent to pharmacy. Uses [facility]/ [address]. Stanton Kidney Neco Kling RN 10/21/12 9:15AM

## 2012-10-22 NOTE — Telephone Encounter (Signed)
Talked with Norm Parcel and Dr Rogelia Boga - called pharmacy and will do Accu-chek meter.

## 2012-10-23 ENCOUNTER — Telehealth: Payer: Self-pay | Admitting: *Deleted

## 2012-10-23 DIAGNOSIS — E1129 Type 2 diabetes mellitus with other diabetic kidney complication: Secondary | ICD-10-CM

## 2012-10-23 NOTE — Telephone Encounter (Signed)
Podiatry referral is OK with me.  How do we accomplish this?

## 2012-10-23 NOTE — Telephone Encounter (Signed)
Pt called - would like a referral to Podiatry - Dr Izetta Dakin for diabetic foot care, discoloed nails and bunions. Pt Has Medicare. Stanton Kidney Delrose Rohwer RN 10/23/12 9:15AM

## 2012-11-03 ENCOUNTER — Telehealth: Payer: Self-pay | Admitting: Dietician

## 2012-11-03 NOTE — Telephone Encounter (Signed)
Telephone follow-up with patient regarding CBG's per Dr. Donnelly Stager request. Patient reports that blood glucose after breakfast was 115 and 273 fasting. He says that he feels that his blood sugars have been declining over the past 3 weeks (before he says that they were usually in the 400's).   Confirmed Diabetes Medications: Confirmed patient taking Metformin 1000 mg BID and Glipizide once daily in the morning.  Amended: After reviewing patient medication list, patient was prescribed Glipizide 5 mg BID, patient was only taking Glipizide once daily in the morning. Called patient to review medication label with him, prescription instructions were to to take Glipizide 5 mg BID. Patient understood that he needs to take it twice daily and plans to take his second dose this evening. Patient is working with front office to schedule a physician appointment for August.

## 2012-11-04 ENCOUNTER — Other Ambulatory Visit: Payer: Self-pay | Admitting: Internal Medicine

## 2012-11-04 HISTORY — PX: MOUTH SURGERY: SHX715

## 2012-11-05 DIAGNOSIS — B351 Tinea unguium: Secondary | ICD-10-CM | POA: Insufficient documentation

## 2012-11-24 ENCOUNTER — Other Ambulatory Visit: Payer: Self-pay | Admitting: Internal Medicine

## 2012-12-21 ENCOUNTER — Other Ambulatory Visit: Payer: Self-pay | Admitting: Internal Medicine

## 2012-12-23 NOTE — Telephone Encounter (Signed)
Pt called again today, is out of meds

## 2012-12-24 ENCOUNTER — Telehealth: Payer: Self-pay | Admitting: *Deleted

## 2012-12-24 NOTE — Telephone Encounter (Signed)
Call from pt said that his Glucose was 79 on yesterday.  Was told to stop Glipizide if sugars were below 120.  Pt said that he stopped Glipizide on yesterday.  Sugar this morning was 119. Took again at 4:45 PM sugar is 121.  Stated will take again tonight.  Pt said that he is keeping a logbook of his sugars.  Pt was asked to bring this in when he returns for an appointment.  Pt said that he has to eat more due to the declining sugars before stopping the Glipizide.   Pt was informed that I would get a message to Dr. Rogelia Boga about his sugars.   Pt said that he need to get an appointment to come in to see Dr. Rogelia Boga for a recheck and has not gotten an appointment.  Pt was inform,ed that I would ask C. Lissa Hoard the scheduler to call patient in the am with an appointment.  Angelina Ok, RN 12/24/2012 5:08 PM.

## 2012-12-25 NOTE — Telephone Encounter (Signed)
I agree with stopping the glipizide. Pt may cont to check his CBG's - once fasting in AM is OK but he may check more freq since sugars and tx are in flux. Pls sch appt with PCP

## 2013-01-05 ENCOUNTER — Ambulatory Visit (INDEPENDENT_AMBULATORY_CARE_PROVIDER_SITE_OTHER): Payer: Medicaid Other | Admitting: *Deleted

## 2013-01-05 DIAGNOSIS — Z23 Encounter for immunization: Secondary | ICD-10-CM

## 2013-01-06 DIAGNOSIS — Z23 Encounter for immunization: Secondary | ICD-10-CM

## 2013-01-07 ENCOUNTER — Ambulatory Visit: Payer: Medicaid Other | Admitting: Internal Medicine

## 2013-01-12 ENCOUNTER — Encounter: Payer: Self-pay | Admitting: Podiatry

## 2013-01-12 DIAGNOSIS — B351 Tinea unguium: Secondary | ICD-10-CM

## 2013-01-14 ENCOUNTER — Other Ambulatory Visit: Payer: Self-pay | Admitting: Internal Medicine

## 2013-01-14 ENCOUNTER — Ambulatory Visit (INDEPENDENT_AMBULATORY_CARE_PROVIDER_SITE_OTHER): Payer: Medicaid Other | Admitting: Internal Medicine

## 2013-01-14 ENCOUNTER — Encounter: Payer: Self-pay | Admitting: Internal Medicine

## 2013-01-14 VITALS — BP 120/83 | HR 85 | Temp 98.6°F | Ht 71.0 in | Wt 353.1 lb

## 2013-01-14 DIAGNOSIS — N058 Unspecified nephritic syndrome with other morphologic changes: Secondary | ICD-10-CM

## 2013-01-14 DIAGNOSIS — G4733 Obstructive sleep apnea (adult) (pediatric): Secondary | ICD-10-CM

## 2013-01-14 DIAGNOSIS — M21612 Bunion of left foot: Secondary | ICD-10-CM

## 2013-01-14 DIAGNOSIS — E785 Hyperlipidemia, unspecified: Secondary | ICD-10-CM

## 2013-01-14 DIAGNOSIS — I872 Venous insufficiency (chronic) (peripheral): Secondary | ICD-10-CM

## 2013-01-14 DIAGNOSIS — M21619 Bunion of unspecified foot: Secondary | ICD-10-CM

## 2013-01-14 DIAGNOSIS — Z6841 Body Mass Index (BMI) 40.0 and over, adult: Secondary | ICD-10-CM

## 2013-01-14 DIAGNOSIS — N183 Chronic kidney disease, stage 3 unspecified: Secondary | ICD-10-CM

## 2013-01-14 DIAGNOSIS — E1129 Type 2 diabetes mellitus with other diabetic kidney complication: Secondary | ICD-10-CM

## 2013-01-14 DIAGNOSIS — E119 Type 2 diabetes mellitus without complications: Secondary | ICD-10-CM

## 2013-01-14 DIAGNOSIS — I10 Essential (primary) hypertension: Secondary | ICD-10-CM

## 2013-01-14 DIAGNOSIS — M21611 Bunion of right foot: Secondary | ICD-10-CM

## 2013-01-14 MED ORDER — AMLODIPINE BESYLATE 10 MG PO TABS: ORAL_TABLET | ORAL | Status: DC

## 2013-01-14 MED ORDER — GLUCOSE BLOOD VI STRP: ORAL_STRIP | Status: DC

## 2013-01-14 NOTE — Assessment & Plan Note (Addendum)
Assessment:  Hgb A1c 8.2 (unchanged from May 2014).  POC CBG is 158.  His glipizide was stopped 1 month ago 2/2 to hypoglycemia.  His logs indicate that he is testing 2-3 times daily and averages 141.  He has not had symptoms of hypoglycemia in the past month.  It seems the biggest hindrances to better glucose control are his dietary indiscretions and weight.  Plan:   1) Continue metformin 1000mg  BID  2) No need to resume glipizide  3) Patient plans to join gym and begin exercise (i.e treadmill) on Oct 1.  Provided handout on benefits of exercise for DM.  4) Patient to stop snacking on chips and dip.  He agrees to not bring these items into his home so he will not be tempted to snack.  Also agrees to cut down the number of peanut butter sandwiches (currently eating 3 single slices with peanut butter daily).  Provided handout on DM meal planning.  5) Refer to [employer] for DM education, particularly concerning nutrition and meal planning  6) Discussed the importance of weight loss for  HTN and DM control.

## 2013-01-14 NOTE — Assessment & Plan Note (Addendum)
Assessment:  Patient with 3+ B/L pitting edema today.  He has not worn his compression stockings in three days because both pairs need to be washed.    Plan:  He agrees to handwash/dry them and continue to wear them daily.

## 2013-01-14 NOTE — Assessment & Plan Note (Signed)
Patient is compliant with atorvastatin 80mg  daily.  Will check lipids at next visit.

## 2013-01-14 NOTE — Assessment & Plan Note (Signed)
Patient reports non-compliance with BiPAP because it make it uncomfortable for him to sleep on his side.  He denies waking up short of breath, morning headaches or daytime sleepiness.  Discussed the importance of wearing the BiPAP.  He voiced understanding.  Will continue to address.

## 2013-01-14 NOTE — Assessment & Plan Note (Addendum)
Assessment:  120/83 today, at goal.  Plan:   1) Continue current medications - Maxzide 37.5/25mg  daily, atenolol 50mg  daily, amlodipine 10mg  daily  2) Patient to join gym and begin exercise (i.e treadmill).  Provided handout on benefits of exercise.  3) Patient to stop snacking on chips and dip.  He agrees to not bring these items into his home so he will not be tempted to snack.  Also agrees to cut down the number of peanut butter sandwiches (currently eating 3 single slices with peanut butter daily)  4) Discussed the importance of weight loss for  HTN and DM control.

## 2013-01-14 NOTE — Progress Notes (Signed)
  Subjective:    Patient ID: Jeremiah Conway, male    DOB: 1956/04/11, 57 y.o.   MRN: [id number]  HPI Comments: Jeremiah Conway is a 57 year old male with a PMH of HTN, OSA, chronic venous insufficiency, DM type 2, OSA, CKD3 and HLD.  He present for follow-up of DM and medication refill.  DM - His DM had been managed with metformin 1000mg  BID and glipizide 5mg  BID.  He had episode of hypoglycemia (62) last month and glipizide was stopped (per telephone notes and patient report).  He has been compliant with metformin 1000mg  BID for the past month.  He denies any hypoglycemic symptoms in the past month.  He denies polydipsia or polyuria.  His Hgb A1c today is 8.2 (unchanged from 3 months ago).  [name] admits to increased snacking on chips and dip, particularly late at night.  He has also been eating peanut butter sandwiches throughout the day in addition to his usual meals.  He reports eating out at fast food restaurants twice weekly.  Last ophthalmology visit was in Feb 2014 - no diabetic retinopathy. No microalbuminuria in Dec 2013.  Scheduled to see podiatry in 2 weeks.    HTN - His BP was initially elevated at 146/102 when he arrived to the office.  He says he was in a rush since he was running late for the visit.  BP was 120/83 on re-check.  He reports compliance with Norvasc 10mg  daily, atenolol 50mg  daily and Maxzide 37.5/25mg .  Denies headache, CP/SOB.     Review of Systems  Constitutional: Positive for fatigue.  Respiratory: Negative for cough and shortness of breath.   Cardiovascular: Positive for leg swelling. Negative for chest pain and palpitations.  Gastrointestinal: Negative for abdominal pain, diarrhea, constipation and blood in stool.  Endocrine: Negative for polydipsia and polyuria.  Genitourinary: Negative for dysuria and hematuria.  Neurological: Negative for numbness and headaches.       Filed Vitals:   01/14/13 1023  BP: 120/83  Pulse: 85  Temp:     Objective:   Physical Exam  Constitutional: He is oriented to person, place, and time. No distress.  HENT:  Head: Normocephalic and atraumatic.  Mouth/Throat: Oropharynx is clear and moist. No oropharyngeal exudate.  Eyes: EOM are normal. Pupils are equal, round, and reactive to light.  Neck: Neck supple.  Cardiovascular: Normal rate, regular rhythm and normal heart sounds.   Pulmonary/Chest: Effort normal and breath sounds normal. No respiratory distress. He has no wheezes. He has no rales.  Abdominal: Soft. Bowel sounds are normal. He exhibits no distension. There is no tenderness.  Musculoskeletal:  3+ B/L lower extremity edema  Neurological: He is alert and oriented to person, place, and time. No cranial nerve deficit.  Skin: Skin is warm. He is not diaphoretic.  Psychiatric: He has a normal mood and affect. His behavior is normal.          Assessment & Plan:  Please see problem based assessment and plan.

## 2013-01-14 NOTE — Assessment & Plan Note (Signed)
Patient scheduled to see podiatry on 01/29/13.

## 2013-01-14 NOTE — Assessment & Plan Note (Signed)
Last Cr 1.32 four months ago (improved from 1.47).  Will recheck BMP at next visit.

## 2013-01-14 NOTE — Patient Instructions (Addendum)
General Instructions: 1. Please continue taking metformin 1000mg  twice daily.  Stop chips/dip and reduce number of peanut butter sandwiches like we discussed.  Start your exercise program with walking on the treadmill.  Carry a granola bar, juice or piece of candy that you can quickly eat if you begin to experience feelings of low blood sugar (i.e. Sweating, lightheaded, nauseous, shaking).  Return to the clinic in 3 months for follow-up.  2. Please take all medications as prescribed.   3. If you have worsening of your symptoms or new symptoms arise, please call the clinic ([phone]), or go to the ER immediately if symptoms are severe.  You have done great job in taking all your medications. I appreciate it very much. Please continue doing that.    Treatment Goals:  Goals (1 Years of Data) as of 01/14/13         As of Today As of Today 11/05/12 10/17/12 09/05/12     Blood Pressure    . Blood Pressure < 130/80  120/83 146/102 134/83 128/83 127/86     Diet    . Eat more fruits and vegetables      No     Exercise    . Exercise 3x per week (30 min per time)      No     Lifestyle    . Quit smoking / using tobacco      Yes     Result Component    . HEMOGLOBIN A1C < 7.0  8.2    8.2    . LDL CALC < 100            Progress Toward Treatment Goals:  Treatment Goal 01/14/2013  Hemoglobin A1C unchanged  Blood pressure at goal  Stop smoking -    Self Care Goals & Plans:  Self Care Goal 01/14/2013  Manage my medications take my medicines as prescribed  Monitor my health keep track of my blood glucose  Eat healthy foods eat more vegetables; eat foods that are low in salt; drink diet soda or water instead of juice or soda; eat baked foods instead of fried foods  Be physically active find an activity I enjoy; find a convenient safe place to exercise  Stop smoking -  Meeting treatment goals maintain the current self-care plan    Home Blood Glucose Monitoring 01/14/2013  Check my blood sugar  3 times a day  When to check my blood sugar before breakfast; before dinner; at bedtime     Care Management & Community Referrals:  Referral 01/14/2013  Referrals made for care management support diabetes educator    Diabetes Meal Planning Guide The diabetes meal planning guide is a tool to help you plan your meals and snacks. It is important for people with diabetes to manage their blood glucose (sugar) levels. Choosing the right foods and the right amounts throughout your day will help control your blood glucose. Eating right can even help you improve your blood pressure and reach or maintain a healthy weight. CARBOHYDRATE COUNTING MADE EASY When you eat carbohydrates, they turn to sugar. This raises your blood glucose level. Counting carbohydrates can help you control this level so you feel better. When you plan your meals by counting carbohydrates, you can have more flexibility in what you eat and balance your medicine with your food intake. Carbohydrate counting simply means adding up the total amount of carbohydrate grams in your meals and snacks. Try to eat about the same amount at each  meal. Foods with carbohydrates are listed below. Each portion below is 1 carbohydrate serving or 15 grams of carbohydrates. Ask your dietician how many grams of carbohydrates you should eat at each meal or snack. Grains and Starches  1 slice bread.   English muffin or hotdog/hamburger bun.   cup cold cereal (unsweetened).   cup cooked pasta or rice.   cup starchy vegetables (corn, potatoes, peas, beans, winter squash).  1 tortilla (6 inches).   bagel.  1 waffle or pancake (size of a CD).   cup cooked cereal.  4 to 6 small crackers. *Whole grain is recommended. Fruit  1 cup fresh unsweetened berries, melon, papaya, pineapple.  1 small fresh fruit.   banana or mango.   cup fruit juice (4 oz unsweetened).   cup canned fruit in natural juice or water.  2 tbs dried  fruit.  12 to 15 grapes or cherries. Milk and Yogurt  1 cup fat-free or 1% milk.  1 cup soy milk.  6 oz light yogurt with sugar-free sweetener.  6 oz low-fat soy yogurt.  6 oz plain yogurt. Vegetables  1 cup raw or  cup cooked is counted as 0 carbohydrates or a "free" food.  If you eat 3 or more servings at 1 meal, count them as 1 carbohydrate serving. Other Carbohydrates   oz chips or pretzels.   cup ice cream or frozen yogurt.   cup sherbet or sorbet.  2 inch square cake, no frosting.  1 tbs honey, sugar, jam, jelly, or syrup.  2 small cookies.  3 squares of graham crackers.  3 cups popcorn.  6 crackers.  1 cup broth-based soup.  Count 1 cup casserole or other mixed foods as 2 carbohydrate servings.  Foods with less than 20 calories in a serving may be counted as 0 carbohydrates or a "free" food. You may want to purchase a book or computer software that lists the carbohydrate gram counts of different foods. In addition, the nutrition facts panel on the labels of the foods you eat are a good source of this information. The label will tell you how big the serving size is and the total number of carbohydrate grams you will be eating per serving. Divide this number by 15 to obtain the number of carbohydrate servings in a portion. Remember, 1 carbohydrate serving equals 15 grams of carbohydrate. SERVING SIZES Measuring foods and serving sizes helps you make sure you are getting the right amount of food. The list below tells how big or small some common serving sizes are.  1 oz.........4 stacked dice.  3 oz........Marland KitchenDeck of cards.  1 tsp.......Marland KitchenTip of little finger.  1 tbs......Marland KitchenMarland KitchenThumb.  2 tbs.......Marland KitchenGolf ball.   cup......Marland KitchenHalf of a fist.  1 cup.......Marland KitchenA fist. SAMPLE DIABETES MEAL PLAN Below is a sample meal plan that includes foods from the grain and starches, dairy, vegetable, fruit, and meat groups. A dietician can individualize a meal plan to fit your  calorie needs and tell you the number of servings needed from each food group. However, controlling the total amount of carbohydrates in your meal or snack is more important than making sure you include all of the food groups at every meal. You may interchange carbohydrate containing foods (dairy, starches, and fruits). The meal plan below is an example of a 2000 calorie diet using carbohydrate counting. This meal plan has 17 carbohydrate servings. Breakfast  1 cup oatmeal (2 carb servings).   cup light yogurt (1 carb serving).  1 cup blueberries (  1 carb serving).   cup almonds. Snack  1 large apple (2 carb servings).  1 low-fat string cheese stick. Lunch  Chicken breast salad.  1 cup spinach.   cup chopped tomatoes.  2 oz chicken breast, sliced.  2 tbs low-fat [place] dressing.  12 whole-wheat crackers (2 carb servings).  12 to 15 grapes (1 carb serving).  1 cup low-fat milk (1 carb serving). Snack  1 cup carrots.   cup hummus (1 carb serving). Dinner  3 oz broiled salmon.  1 cup brown rice (3 carb servings). Snack  1  cups steamed broccoli (1 carb serving) drizzled with 1 tsp olive oil and lemon juice.  1 cup light pudding (2 carb servings). DIABETES MEAL PLANNING WORKSHEET Your dietician can use this worksheet to help you decide how many servings of foods and what types of foods are right for you.  BREAKFAST Food Group and Servings / Carb Servings Grain/Starches __________________________________ Dairy __________________________________________ Vegetable ______________________________________ Fruit ___________________________________________ Meat __________________________________________ Fat ____________________________________________ LUNCH Food Group and Servings / Carb Servings Grain/Starches ___________________________________ Dairy ___________________________________________ Fruit ____________________________________________ Meat  ___________________________________________ Fat _____________________________________________ Laural Golden Food Group and Servings / Carb Servings Grain/Starches ___________________________________ Dairy ___________________________________________ Fruit ____________________________________________ Meat ___________________________________________ Fat _____________________________________________ SNACKS Food Group and Servings / Carb Servings Grain/Starches ___________________________________ Dairy ___________________________________________ Vegetable _______________________________________ Fruit ____________________________________________ Meat ___________________________________________ Fat _____________________________________________ DAILY TOTALS Starches _________________________ Vegetable ________________________ Fruit ____________________________ Dairy ____________________________ Meat ____________________________ Fat ______________________________ Document Released: 01/11/2005 Document Revised: 07/09/2011 Document Reviewed: 11/22/2008 [employer] Patient Information 2014 [employer].  Diabetes and Exercise Regular exercise is important and can help:   Control blood glucose (sugar).  Decrease blood pressure.    Control blood lipids (cholesterol, triglycerides).  Improve overall health. BENEFITS FROM EXERCISE  Improved fitness.  Improved flexibility.  Improved endurance.  Increased bone density.  Weight control.  Increased muscle strength.  Decreased body fat.  Improvement of the body's use of insulin, a hormone.  Increased insulin sensitivity.  Reduction of insulin needs.  Reduced stress and tension.  Helps you feel better. People with diabetes who add exercise to their lifestyle gain additional benefits, including:  Weight loss.  Reduced appetite.  Improvement of the body's use of blood glucose.  Decreased risk factors for heart  disease:  Lowering of cholesterol and triglycerides.  Raising the level of good cholesterol (high-density lipoproteins, HDL).  Lowering blood sugar.  Decreased blood pressure. TYPE 1 DIABETES AND EXERCISE  Exercise will usually lower your blood glucose.  If blood glucose is greater than 240 mg/dl, check urine ketones. If ketones are present, do not exercise.  Location of the insulin injection sites may need to be adjusted with exercise. Avoid injecting insulin into areas of the body that will be exercised. For example, avoid injecting insulin into:  The arms when playing tennis.  The legs when jogging. For more information, discuss this with your caregiver.  Keep a record of:  Food intake.  Type and amount of exercise.  Expected peak times of insulin action.  Blood glucose levels. Do this before, during, and after exercise. Review your records with your caregiver. This will help you to develop guidelines for adjusting food intake and insulin amounts.  TYPE 2 DIABETES AND EXERCISE  Regular physical activity can help control blood glucose.  Exercise is important because it may:  Increase the body's sensitivity to insulin.  Improve blood glucose control.  Exercise reduces the risk of heart disease. It decreases serum cholesterol and triglycerides. It also lowers blood pressure.  Those who take insulin  or oral hypoglycemic agents should watch for signs of hypoglycemia. These signs include dizziness, shaking, sweating, chills, and confusion.  Body water is lost during exercise. It must be replaced. This will help to avoid loss of body fluids (dehydration) or heat stroke. Be sure to talk to your caregiver before starting an exercise program to make sure it is safe for you. Remember, any activity is better than none.  Document Released: 07/07/2003 Document Revised: 07/09/2011 Document Reviewed: 10/21/2008 [facility] Patient Information 2014 [place], [place].

## 2013-01-14 NOTE — Assessment & Plan Note (Signed)
Patient plans to begin working out and making dietary changes to lose weight.

## 2013-01-15 NOTE — Progress Notes (Signed)
INTERNAL MEDICINE TEACHING ATTENDING ADDENDUM - Jonah Blue, DO, FACP: I personally saw and evaluated Theodoro Grist in this clinic visit in conjunction with the resident, Dr. Andrey Campanile. I have discussed patient's plan of care with medical resident during this visit. I have confirmed the physical exam findings and have read and agree with the clinic note including the plan.

## 2013-01-29 ENCOUNTER — Ambulatory Visit: Payer: Self-pay | Admitting: Podiatry

## 2013-03-05 ENCOUNTER — Telehealth: Payer: Self-pay | Admitting: Dietician

## 2013-03-05 NOTE — Telephone Encounter (Signed)
Called to offer patient an earlier appointment this month. Left office number as call back

## 2013-03-19 ENCOUNTER — Ambulatory Visit: Payer: Self-pay | Admitting: Podiatry

## 2013-04-01 ENCOUNTER — Encounter: Payer: Medicaid Other | Admitting: Internal Medicine

## 2013-04-01 ENCOUNTER — Encounter: Payer: Medicaid Other | Admitting: Dietician

## 2013-04-01 NOTE — Addendum Note (Signed)
Addended by: Neomia Dear on: 04/01/2013 07:27 PM   Modules accepted: Orders

## 2013-04-29 ENCOUNTER — Other Ambulatory Visit: Payer: Self-pay | Admitting: Internal Medicine

## 2013-05-05 ENCOUNTER — Encounter: Payer: Self-pay | Admitting: Internal Medicine

## 2013-05-05 ENCOUNTER — Other Ambulatory Visit: Payer: Self-pay | Admitting: *Deleted

## 2013-05-05 DIAGNOSIS — I10 Essential (primary) hypertension: Secondary | ICD-10-CM

## 2013-05-05 MED ORDER — TRIAMTERENE-HCTZ 37.5-25 MG PO TABS: 1.0000 | ORAL_TABLET | Freq: Every day | ORAL | Status: DC

## 2013-05-06 ENCOUNTER — Other Ambulatory Visit: Payer: Self-pay | Admitting: *Deleted

## 2013-05-06 DIAGNOSIS — I10 Essential (primary) hypertension: Secondary | ICD-10-CM

## 2013-05-06 DIAGNOSIS — E119 Type 2 diabetes mellitus without complications: Secondary | ICD-10-CM

## 2013-05-06 DIAGNOSIS — E1129 Type 2 diabetes mellitus with other diabetic kidney complication: Secondary | ICD-10-CM

## 2013-05-06 DIAGNOSIS — E785 Hyperlipidemia, unspecified: Secondary | ICD-10-CM

## 2013-05-13 MED ORDER — ATORVASTATIN CALCIUM 80 MG PO TABS: 80.0000 mg | ORAL_TABLET | Freq: Every day | ORAL | Status: DC

## 2013-05-13 MED ORDER — AMLODIPINE BESYLATE 10 MG PO TABS: 10.0000 mg | ORAL_TABLET | Freq: Every day | ORAL | Status: DC

## 2013-05-13 MED ORDER — GLUCOSE BLOOD VI STRP: ORAL_STRIP | Status: DC

## 2013-05-28 ENCOUNTER — Other Ambulatory Visit: Payer: Self-pay | Admitting: Internal Medicine

## 2013-05-28 DIAGNOSIS — I10 Essential (primary) hypertension: Secondary | ICD-10-CM

## 2013-05-28 MED ORDER — ATENOLOL 50 MG PO TABS: ORAL_TABLET | ORAL | Status: DC

## 2013-05-28 NOTE — Progress Notes (Signed)
Pt met me in elevator and asked about refilling his meds. Handed me bottle of Amlodipine (Dr Eppie Gibson had refilled one yr on the 14th) and atenolol (I refilled for one yr). I informed him that they are 3 month supplies. LAst seen 9/14 and told him it was time for appt and that Dr Redmond Pulling had opening tomorrow but he declined that appt. Offered to get labs today and he declined. Sated he would make appt before leaving today (here for mom's appt).

## 2013-06-04 ENCOUNTER — Other Ambulatory Visit: Payer: Self-pay | Admitting: Internal Medicine

## 2013-06-05 NOTE — Telephone Encounter (Signed)
No response from Dr Wilson, please refill 

## 2013-06-17 ENCOUNTER — Encounter: Payer: Medicaid Other | Admitting: Internal Medicine

## 2013-06-28 ENCOUNTER — Other Ambulatory Visit: Payer: Self-pay | Admitting: Internal Medicine

## 2013-07-13 ENCOUNTER — Ambulatory Visit (INDEPENDENT_AMBULATORY_CARE_PROVIDER_SITE_OTHER): Payer: Medicaid Other | Admitting: Podiatry

## 2013-07-13 ENCOUNTER — Encounter: Payer: Self-pay | Admitting: Podiatry

## 2013-07-13 VITALS — BP 141/101 | HR 92 | Resp 18

## 2013-07-13 DIAGNOSIS — B351 Tinea unguium: Secondary | ICD-10-CM

## 2013-07-13 DIAGNOSIS — M79609 Pain in unspecified limb: Secondary | ICD-10-CM

## 2013-07-13 NOTE — Patient Instructions (Signed)
Diabetes and Foot Care Diabetes may cause you to have problems because of poor blood supply (circulation) to your feet and legs. This may cause the skin on your feet to become thinner, break easier, and heal more slowly. Your skin may become dry, and the skin may peel and crack. You may also have nerve damage in your legs and feet causing decreased feeling in them. You may not notice minor injuries to your feet that could lead to infections or more serious problems. Taking care of your feet is one of the most important things you can do for yourself.  HOME CARE INSTRUCTIONS  Wear shoes at all times, even in the house. Do not go barefoot. Bare feet are easily injured.  Check your feet daily for blisters, cuts, and redness. If you cannot see the bottom of your feet, use a mirror or ask someone for help.  Wash your feet with warm water (do not use hot water) and mild soap. Then pat your feet and the areas between your toes until they are completely dry. Do not soak your feet as this can dry your skin.  Apply a moisturizing lotion or petroleum jelly (that does not contain alcohol and is unscented) to the skin on your feet and to dry, brittle toenails. Do not apply lotion between your toes.  Trim your toenails straight across. Do not dig under them or around the cuticle. File the edges of your nails with an emery board or nail file.  Do not cut corns or calluses or try to remove them with medicine.  Wear clean socks or stockings every day. Make sure they are not too tight. Do not wear knee-high stockings since they may decrease blood flow to your legs.  Wear shoes that fit properly and have enough cushioning. To break in new shoes, wear them for just a few hours a day. This prevents you from injuring your feet. Always look in your shoes before you put them on to be sure there are no objects inside.  Do not cross your legs. This may decrease the blood flow to your feet.  If you find a minor scrape,  cut, or break in the skin on your feet, keep it and the skin around it clean and dry. These areas may be cleansed with mild soap and water. Do not cleanse the area with peroxide, alcohol, or iodine.  When you remove an adhesive bandage, be sure not to damage the skin around it.  If you have a wound, look at it several times a day to make sure it is healing.  Do not use heating pads or hot water bottles. They may burn your skin. If you have lost feeling in your feet or legs, you may not know it is happening until it is too late.  Make sure your health care provider performs a complete foot exam at least annually or more often if you have foot problems. Report any cuts, sores, or bruises to your health care provider immediately. SEEK MEDICAL CARE IF:   You have an injury that is not healing.  You have cuts or breaks in the skin.  You have an ingrown nail.  You notice redness on your legs or feet.  You feel burning or tingling in your legs or feet.  You have pain or cramps in your legs and feet.  Your legs or feet are numb.  Your feet always feel cold. SEEK IMMEDIATE MEDICAL CARE IF:   There is increasing redness,   swelling, or pain in or around a wound.  There is a red line that goes up your leg.  Pus is coming from a wound.  You develop a fever or as directed by your health care provider.  You notice a bad smell coming from an ulcer or wound. Document Released: 04/13/2000 Document Revised: 12/17/2012 Document Reviewed: 09/23/2012 [employer] Patient Information 2014 [employer].  

## 2013-07-13 NOTE — Progress Notes (Signed)
   Subjective:    Patient ID: Jeremiah Conway, male    DOB: 11-23-1955, 58 y.o.   MRN: [id number]  HPI I used to see Dr Charlsie Merles about my toenails and they are thick and discolored and he said there was nothing I can do for them, but I think there is something I can use. The last visit for similar service was 11/05/2012    Review of Systems     Objective:   Physical Exam Dermatological: Discolored, hypertrophic, brittle toenails with palpable tenderness in all nail plates       Assessment & Plan:   Assessment: Neglected symptomatic onychomycoses x10  Plan: Nails x10 are debrided without any bleeding. Advised patient because the nails were severely mycotic I thought oral topical medications would have no affect. I recommend periodic debridement at three-month intervals.  Reappoint in 3 months

## 2013-09-16 ENCOUNTER — Ambulatory Visit (INDEPENDENT_AMBULATORY_CARE_PROVIDER_SITE_OTHER): Payer: Medicaid Other | Admitting: Internal Medicine

## 2013-09-16 ENCOUNTER — Encounter: Payer: Self-pay | Admitting: Internal Medicine

## 2013-09-16 VITALS — BP 141/91 | HR 93 | Temp 98.2°F | Ht 71.5 in | Wt 347.6 lb

## 2013-09-16 DIAGNOSIS — Z6841 Body Mass Index (BMI) 40.0 and over, adult: Secondary | ICD-10-CM

## 2013-09-16 DIAGNOSIS — I129 Hypertensive chronic kidney disease with stage 1 through stage 4 chronic kidney disease, or unspecified chronic kidney disease: Secondary | ICD-10-CM

## 2013-09-16 DIAGNOSIS — G4733 Obstructive sleep apnea (adult) (pediatric): Secondary | ICD-10-CM

## 2013-09-16 DIAGNOSIS — E1129 Type 2 diabetes mellitus with other diabetic kidney complication: Secondary | ICD-10-CM

## 2013-09-16 DIAGNOSIS — I10 Essential (primary) hypertension: Secondary | ICD-10-CM

## 2013-09-16 DIAGNOSIS — N400 Enlarged prostate without lower urinary tract symptoms: Secondary | ICD-10-CM

## 2013-09-16 DIAGNOSIS — I872 Venous insufficiency (chronic) (peripheral): Secondary | ICD-10-CM

## 2013-09-16 DIAGNOSIS — N183 Chronic kidney disease, stage 3 unspecified: Secondary | ICD-10-CM

## 2013-09-16 DIAGNOSIS — E785 Hyperlipidemia, unspecified: Secondary | ICD-10-CM

## 2013-09-16 LAB — BASIC METABOLIC PANEL WITH GFR
BUN: 15 mg/dL (ref 6–23)
CHLORIDE: 103 meq/L (ref 96–112)
CO2: 30 meq/L (ref 19–32)
Calcium: 10.3 mg/dL (ref 8.4–10.5)
Creat: 1.36 mg/dL — ABNORMAL HIGH (ref 0.50–1.35)
GFR, Est African American: 66 mL/min
GFR, Est Non African American: 57 mL/min — ABNORMAL LOW
GLUCOSE: 92 mg/dL (ref 70–99)
POTASSIUM: 3.9 meq/L (ref 3.5–5.3)
Sodium: 142 mEq/L (ref 135–145)

## 2013-09-16 LAB — LIPID PANEL
CHOL/HDL RATIO: 5.1 ratio
CHOLESTEROL: 172 mg/dL (ref 0–200)
HDL: 34 mg/dL — ABNORMAL LOW (ref >39)
LDL Cholesterol: 106 mg/dL — ABNORMAL HIGH (ref 0–99)
Triglycerides: 162 mg/dL — ABNORMAL HIGH (ref <150)
VLDL: 32 mg/dL (ref 0–40)

## 2013-09-16 LAB — GLUCOSE, CAPILLARY: Glucose-Capillary: 133 mg/dL — ABNORMAL HIGH (ref 70–99)

## 2013-09-16 LAB — POCT GLYCOSYLATED HEMOGLOBIN (HGB A1C): Hemoglobin A1C: 6.7

## 2013-09-16 MED ORDER — TRIAMTERENE-HCTZ 37.5-25 MG PO TABS: ORAL_TABLET | ORAL | Status: DC

## 2013-09-16 MED ORDER — ATENOLOL 50 MG PO TABS: ORAL_TABLET | ORAL | Status: DC

## 2013-09-16 MED ORDER — METFORMIN HCL 1000 MG PO TABS: ORAL_TABLET | ORAL | Status: DC

## 2013-09-16 MED ORDER — AMLODIPINE BESYLATE 10 MG PO TABS: ORAL_TABLET | ORAL | Status: DC

## 2013-09-16 MED ORDER — TAMSULOSIN HCL 0.4 MG PO CAPS: ORAL_CAPSULE | ORAL | Status: DC

## 2013-09-16 NOTE — Assessment & Plan Note (Addendum)
He is still non-compliant with BiPAP.  Reports he hasn't worn in 2.5 months because he changed his bedroom around and the electric socket is too far from his bed.  No morning headaches, not waking up dyspneic.    I reminded him that he has periods where he stops breathing in his sleep and that can seriously affect his health and can increase risk of death.  He voiced understanding and says he will start using the machine again.

## 2013-09-16 NOTE — Progress Notes (Signed)
   Subjective:    Patient ID: Jeremiah Conway, male    DOB: [birth year], 58 y.o.   MRN: [id number]  HPI Comments: Jeremiah Conway is a 58 year old male with a PMH of HTN, OSA, chronic venous insufficiency, DM type 2, OSA, CKD3 and HLD.  He present for follow-up of DM and medication refill.  He is doing well. No complaints.  Eating more oatmeal, baked foods, eating less fried/fast food, drinking more water.  Quit smoking over 15 months ago.  Plans to join gym.  Compliant with all therapies except BiPAP.     Review of Systems  Constitutional: Negative for fever, chills and appetite change.  HENT: Negative for hearing loss.   Eyes: Negative for pain and visual disturbance.  Respiratory: Negative for cough and shortness of breath.   Cardiovascular: Positive for palpitations. Negative for chest pain and leg swelling.  Gastrointestinal: Negative for nausea, vomiting, abdominal pain, diarrhea, constipation and blood in stool.  Endocrine: Negative for polydipsia and polyuria.  Genitourinary: Negative for dysuria, frequency, hematuria and difficulty urinating.  Neurological: Negative for dizziness, syncope, light-headedness and headaches.       Objective:   Physical Exam  Constitutional: He is oriented to person, place, and time. No distress.  HENT:  Head: Normocephalic and atraumatic.  Mouth/Throat: Oropharynx is clear and moist.  Eyes: EOM are normal. Pupils are equal, round, and reactive to light.  Neck: Normal range of motion. Neck supple.  Cardiovascular: Normal rate and regular rhythm.  Exam reveals no gallop and no friction rub.   No murmur heard. Pulmonary/Chest: Effort normal and breath sounds normal. No respiratory distress. He has no wheezes. He has no rales.  Abdominal: Soft. Bowel sounds are normal. He exhibits no distension. There is no tenderness.  Musculoskeletal: Normal range of motion. He exhibits edema. He exhibits no tenderness.  1+ B/L lower extremity edema; legs are in  compression stockings  Neurological: He is alert and oriented to person, place, and time. No cranial nerve deficit.  Skin: Skin is warm. He is not diaphoretic.  Psychiatric: He has a normal mood and affect. His behavior is normal.          Assessment & Plan:  Please see problem based assessment and plan.

## 2013-09-16 NOTE — Assessment & Plan Note (Addendum)
He notices less swelling with compression stockings. He will continue to wear them.

## 2013-09-16 NOTE — Assessment & Plan Note (Signed)
Down 6 pounds since visit 8 months ago.  Encouraged continued dietary changes and adding exercise.

## 2013-09-16 NOTE — Assessment & Plan Note (Signed)
BMP today

## 2013-09-16 NOTE — Patient Instructions (Signed)
General Instructions: 1. Please return for check-up in 6 months or sooner if you need to.   2. Please take all medications as prescribed.    3. If you have worsening of your symptoms or new symptoms arise, please call the clinic ([phone]), or go to the ER immediately if symptoms are severe.   Treatment Goals:  Goals (1 Years of Data) as of 09/16/13         As of Today 07/13/13 01/14/13 01/14/13 11/05/12     Blood Pressure    . Blood Pressure < 130/80  141/91 141/101 120/83 146/102 134/83     Diet    . Eat more fruits and vegetables           Exercise    . Exercise 3x per week (30 min per time)           Lifestyle    . Quit smoking / using tobacco           Result Component    . HEMOGLOBIN A1C < 7.0  6.7  8.2      . LDL CALC < 100            Progress Toward Treatment Goals:  Treatment Goal 09/16/2013  Hemoglobin A1C at goal  Blood pressure unchanged  Stop smoking -    Self Care Goals & Plans:  Self Care Goal 09/16/2013  Manage my medications take my medicines as prescribed; bring my medications to every visit; refill my medications on time  Monitor my health -  Eat healthy foods drink diet soda or water instead of juice or soda; eat more vegetables; eat foods that are low in salt; eat baked foods instead of fried foods; eat fruit for snacks and desserts  Be physically active -  Stop smoking -  Meeting treatment goals -    Home Blood Glucose Monitoring 09/16/2013  Check my blood sugar 2 times a day  When to check my blood sugar -     Care Management & Community Referrals:  Referral 01/14/2013  Referrals made for care management support diabetes educator     Diabetes and Exercise Exercising regularly is important. It is not just about losing weight. It has many health benefits, such as:  Improving your overall fitness, flexibility, and endurance.  Increasing your bone density.  Helping with weight control.  Decreasing your body fat.  Increasing your  muscle strength.  Reducing stress and tension.  Improving your overall health. People with diabetes who exercise gain additional benefits because exercise:  Reduces appetite.  Improves the body's use of blood sugar (glucose).  Helps lower or control blood glucose.  Decreases blood pressure.  Helps control blood lipids (such as cholesterol and triglycerides).  Improves the body's use of the hormone insulin by:  Increasing the body's insulin sensitivity.  Reducing the body's insulin needs.  Decreases the risk for heart disease because exercising:  Lowers cholesterol and triglycerides levels.  Increases the levels of good cholesterol (such as high-density lipoproteins [HDL]) in the body.  Lowers blood glucose levels. YOUR ACTIVITY PLAN  Choose an activity that you enjoy and set realistic goals. Your health care provider or diabetes educator can help you make an activity plan that works for you. You can break activities into 2 or 3 sessions throughout the day. Doing so is as good as one long session. Exercise ideas include:  Taking the dog for a walk.  Taking the stairs instead of the elevator.  Dancing to your  favorite song.  Doing your favorite exercise with a friend. RECOMMENDATIONS FOR EXERCISING WITH TYPE 1 OR TYPE 2 DIABETES   Check your blood glucose before exercising. If blood glucose levels are greater than 240 mg/dL, check for urine ketones. Do not exercise if ketones are present.  Avoid injecting insulin into areas of the body that are going to be exercised. For example, avoid injecting insulin into:  The arms when playing tennis.  The legs when jogging.  Keep a record of:  Food intake before and after you exercise.  Expected peak times of insulin action.  Blood glucose levels before and after you exercise.  The type and amount of exercise you have done.  Review your records with your health care provider. Your health care provider will help you to  develop guidelines for adjusting food intake and insulin amounts before and after exercising.  If you take insulin or oral hypoglycemic agents, watch for signs and symptoms of hypoglycemia. They include:  Dizziness.  Shaking.  Sweating.  Chills.  Confusion.  Drink plenty of water while you exercise to prevent dehydration or heat stroke. Body water is lost during exercise and must be replaced.  Talk to your health care provider before starting an exercise program to make sure it is safe for you. Remember, almost any type of activity is better than none. Document Released: 07/07/2003 Document Revised: 12/17/2012 Document Reviewed: 09/23/2012 [facility] Patient Information 2014 [place], [place].  Diabetes Meal Planning Guide The diabetes meal planning guide is a tool to help you plan your meals and snacks. It is important for people with diabetes to manage their blood glucose (sugar) levels. Choosing the right foods and the right amounts throughout your day will help control your blood glucose. Eating right can even help you improve your blood pressure and reach or maintain a healthy weight. CARBOHYDRATE COUNTING MADE EASY When you eat carbohydrates, they turn to sugar. This raises your blood glucose level. Counting carbohydrates can help you control this level so you feel better. When you plan your meals by counting carbohydrates, you can have more flexibility in what you eat and balance your medicine with your food intake. Carbohydrate counting simply means adding up the total amount of carbohydrate grams in your meals and snacks. Try to eat about the same amount at each meal. Foods with carbohydrates are listed below. Each portion below is 1 carbohydrate serving or 15 grams of carbohydrates. Ask your dietician how many grams of carbohydrates you should eat at each meal or snack. Grains and Starches  1 slice bread.   English muffin or hotdog/hamburger bun.   cup cold cereal  (unsweetened).   cup cooked pasta or rice.   cup starchy vegetables (corn, potatoes, peas, beans, winter squash).  1 tortilla (6 inches).   bagel.  1 waffle or pancake (size of a CD).   cup cooked cereal.  4 to 6 small crackers. *Whole grain is recommended. Fruit  1 cup fresh unsweetened berries, melon, papaya, pineapple.  1 small fresh fruit.   banana or mango.   cup fruit juice (4 oz unsweetened).   cup canned fruit in natural juice or water.  2 tbs dried fruit.  12 to 15 grapes or cherries. Milk and Yogurt  1 cup fat-free or 1% milk.  1 cup soy milk.  6 oz light yogurt with sugar-free sweetener.  6 oz low-fat soy yogurt.  6 oz plain yogurt. Vegetables  1 cup raw or  cup cooked is counted as 0  carbohydrates or a "free" food.  If you eat 3 or more servings at 1 meal, count them as 1 carbohydrate serving. Other Carbohydrates   oz chips or pretzels.   cup ice cream or frozen yogurt.   cup sherbet or sorbet.  2 inch square cake, no frosting.  1 tbs honey, sugar, jam, jelly, or syrup.  2 small cookies.  3 squares of graham crackers.  3 cups popcorn.  6 crackers.  1 cup broth-based soup.  Count 1 cup casserole or other mixed foods as 2 carbohydrate servings.  Foods with less than 20 calories in a serving may be counted as 0 carbohydrates or a "free" food. You may want to purchase a book or computer software that lists the carbohydrate gram counts of different foods. In addition, the nutrition facts panel on the labels of the foods you eat are a good source of this information. The label will tell you how big the serving size is and the total number of carbohydrate grams you will be eating per serving. Divide this number by 15 to obtain the number of carbohydrate servings in a portion. Remember, 1 carbohydrate serving equals 15 grams of carbohydrate. SERVING SIZES Measuring foods and serving sizes helps you make sure you are getting the  right amount of food. The list below tells how big or small some common serving sizes are.  1 oz.........4 stacked dice.  3 oz........Marland KitchenDeck of cards.  1 tsp.......Marland KitchenTip of little finger.  1 tbs......Marland KitchenMarland KitchenThumb.  2 tbs.......Marland KitchenGolf ball.   cup......Marland KitchenHalf of a fist.  1 cup.......Marland KitchenA fist. SAMPLE DIABETES MEAL PLAN Below is a sample meal plan that includes foods from the grain and starches, dairy, vegetable, fruit, and meat groups. A dietician can individualize a meal plan to fit your calorie needs and tell you the number of servings needed from each food group. However, controlling the total amount of carbohydrates in your meal or snack is more important than making sure you include all of the food groups at every meal. You may interchange carbohydrate containing foods (dairy, starches, and fruits). The meal plan below is an example of a 2000 calorie diet using carbohydrate counting. This meal plan has 17 carbohydrate servings. Breakfast  1 cup oatmeal (2 carb servings).   cup light yogurt (1 carb serving).  1 cup blueberries (1 carb serving).   cup almonds. Snack  1 large apple (2 carb servings).  1 low-fat string cheese stick. Lunch  Chicken breast salad.  1 cup spinach.   cup chopped tomatoes.  2 oz chicken breast, sliced.  2 tbs low-fat [place] dressing.  12 whole-wheat crackers (2 carb servings).  12 to 15 grapes (1 carb serving).  1 cup low-fat milk (1 carb serving). Snack  1 cup carrots.   cup hummus (1 carb serving). Dinner  3 oz broiled salmon.  1 cup brown rice (3 carb servings). Snack  1  cups steamed broccoli (1 carb serving) drizzled with 1 tsp olive oil and lemon juice.  1 cup light pudding (2 carb servings). DIABETES MEAL PLANNING WORKSHEET Your dietician can use this worksheet to help you decide how many servings of foods and what types of foods are right for you.  BREAKFAST Food Group and Servings / Carb Servings Grain/Starches  __________________________________ Dairy __________________________________________ Vegetable ______________________________________ Fruit ___________________________________________ Meat __________________________________________ Fat ____________________________________________ LUNCH Food Group and Servings / Carb Servings Grain/Starches ___________________________________ Dairy ___________________________________________ Fruit ____________________________________________ Meat ___________________________________________ Fat _____________________________________________ Laural [place] Food Group and Servings / Carb Servings Grain/Starches ___________________________________ Dairy ___________________________________________  Fruit ____________________________________________ Meat ___________________________________________ Fat _____________________________________________ SNACKS Food Group and Servings / Carb Servings Grain/Starches ___________________________________ Dairy ___________________________________________ Vegetable _______________________________________ Fruit ____________________________________________ Meat ___________________________________________ Fat _____________________________________________ DAILY TOTALS Starches _________________________ Vegetable ________________________ Fruit ____________________________ Dairy ____________________________ Meat ____________________________ Fat ______________________________ Document Released: 01/11/2005 Document Revised: 07/09/2011 Document Reviewed: 11/22/2008 Exit[employer] Patient Information 2014 [place], [place].

## 2013-09-16 NOTE — Assessment & Plan Note (Addendum)
BP Readings from Last 3 Encounters:  09/16/13 141/91  07/13/13 141/101  01/14/13 120/83    Lab Results  Component Value Date   NA 140 09/05/2012   K 4.1 09/05/2012   CREATININE 1.32 09/05/2012    Assessment: Blood pressure control:   slightly elevated Progress toward BP goal:   stable  Plan: Medications:  continue current medications:  Maxzide 37.5-25mg  daily, Norvasc 10mg  daily and atenolol 50mg  daily Educational resources provided: brochure Other plans: continue dietary changes and add exercise

## 2013-09-16 NOTE — Assessment & Plan Note (Signed)
He is compliant with atorvastatin 80mg  qHS.  He is due for lipid panel. - check lipid panel today

## 2013-09-16 NOTE — Assessment & Plan Note (Addendum)
Lab Results  Component Value Date   HGBA1C 6.7 09/16/2013   HGBA1C 8.2 01/14/2013   HGBA1C 8.2 09/05/2012     Assessment: Diabetes control:  good control Progress toward A1C goal:   at goal Comments: I am pleased with his progress.  Hgb A1c 8.2 --> 6.7  Plan: Medications:  continue current medications:  Metformin 1000mg  BID Home glucose monitoring: twice daily Frequency:   Timing:   Instruction/counseling given: reminded to get eye exam, we will refer him Educational resources provided: brochure Self management tools provided: copy of home glucose meter download;home glucose logbook Other plans: check urine microalb/creatinine though patient is not a candidate for ACEI/ARB given angioedema with ACEI.  Continue dietary changes and add exercise.  Continue to bring meter to every visit.

## 2013-09-17 LAB — MICROALBUMIN / CREATININE URINE RATIO
Creatinine, Urine: 115.8 mg/dL
Microalb Creat Ratio: 7.7 mg/g (ref 0.0–30.0)
Microalb, Ur: 0.89 mg/dL (ref 0.00–1.89)

## 2013-09-17 NOTE — Progress Notes (Signed)
INTERNAL MEDICINE TEACHING ATTENDING ADDENDUM - Marisal Swarey, MD: I reviewed and discussed with the resident Dr. Wilson, the patient's medical history, physical examination, diagnosis and results of pertinent tests and treatment and I agree with the patient's care as documented. 

## 2013-10-11 ENCOUNTER — Other Ambulatory Visit: Payer: Self-pay | Admitting: Internal Medicine

## 2013-10-18 ENCOUNTER — Other Ambulatory Visit: Payer: Self-pay | Admitting: Internal Medicine

## 2013-10-19 ENCOUNTER — Ambulatory Visit (INDEPENDENT_AMBULATORY_CARE_PROVIDER_SITE_OTHER): Payer: Medicaid Other | Admitting: Podiatry

## 2013-10-19 ENCOUNTER — Encounter: Payer: Self-pay | Admitting: Podiatry

## 2013-10-19 VITALS — BP 138/88 | HR 87 | Resp 16

## 2013-10-19 DIAGNOSIS — B351 Tinea unguium: Secondary | ICD-10-CM

## 2013-10-19 DIAGNOSIS — M79609 Pain in unspecified limb: Secondary | ICD-10-CM

## 2013-10-19 DIAGNOSIS — M79673 Pain in unspecified foot: Secondary | ICD-10-CM

## 2013-10-19 NOTE — Progress Notes (Signed)
Patient ID: Jeremiah Conway, male   DOB: February 17, 1956, 58 y.o.   MRN: [id number]  Subjective: This patient presents today complaining of painful toenails the right and left feet  Objective: Hypertrophic, incurvated, discolored, brittle toenails x10  Assessment: Symptomatic onychomycoses x10  Plan: Debrided toenails x10 without a bleeding  Reappoint x3 months

## 2013-10-22 ENCOUNTER — Encounter: Payer: Self-pay | Admitting: Internal Medicine

## 2013-10-22 ENCOUNTER — Ambulatory Visit (INDEPENDENT_AMBULATORY_CARE_PROVIDER_SITE_OTHER): Payer: Medicaid Other | Admitting: Internal Medicine

## 2013-10-22 VITALS — BP 142/91 | HR 89 | Temp 98.7°F | Wt 347.3 lb

## 2013-10-22 DIAGNOSIS — J302 Other seasonal allergic rhinitis: Secondary | ICD-10-CM

## 2013-10-22 DIAGNOSIS — I10 Essential (primary) hypertension: Secondary | ICD-10-CM

## 2013-10-22 DIAGNOSIS — J309 Allergic rhinitis, unspecified: Secondary | ICD-10-CM

## 2013-10-22 DIAGNOSIS — E1129 Type 2 diabetes mellitus with other diabetic kidney complication: Secondary | ICD-10-CM

## 2013-10-22 MED ORDER — CETIRIZINE HCL 10 MG PO CAPS: 10.0000 mg | ORAL_CAPSULE | Freq: Every day | ORAL | Status: DC

## 2013-10-22 MED ORDER — FLUTICASONE PROPIONATE 50 MCG/ACT NA SUSP: 2.0000 | Freq: Every day | NASAL | Status: DC

## 2013-10-22 NOTE — Progress Notes (Signed)
Patient ID: Jeremiah Conway, male   DOB: 1955/09/20, 58 y.o.   MRN: [id number]    Subjective:   Patient ID: Jeremiah Conway male   DOB: January 14, 1956 58 y.o.   MRN: [id number]  HPI: [name] is a 58 y.o. pleasant man with past medical history of non-insulin Type II DM, hypertension, hyperlipidemia, chronic venous insufficiency, CKD Stage 3, OSA, allergic rhinitis, and tobacco abuse who presents with chief complaint of nasal congestion.   He reports a history of seasonal allergies that occurs at this time of the year every year. He states he has had 2 month history of rhinorrhea and nasal congestion that is worse at night. He has trouble breathing out of his nose (both nostrils) at night and has been using afrin spray for the past 1.5 months. He denies chest pain or palpitations. He has not had relief with OTC claritin but has had relief with zyrtec in the past and has a nasal spray (flonase) at home which he has not been using.  He also reports occasional sneezing, red eyes without itching, and sinus pressure without sinus tenderness, fever, chills, facial/tooth pain, cough, wheezing, or dyspnea. He bought a neti pot and used it about 3 weeks ago which helped and upon use resulted in brownish nasal discharge. Now the drainage is clear in color. He has past history of broken nose but denies history of sinusitis. He has never had skin allergy testing and is unsure what his triggers are.   His last A1c was 6.7 on 09/16/13. He reports compliance with metformin 1000 mg daily with no GI side effects. He monitors his blood glucose at home with average values in the 100's. He denies recent symptomatic hypoglycemia, polyuria, polydipsia, polyphagia, blurry vision, neuropathy, or foot ulcer/injury. He tries to follow a carbohydrate-modified diet and exercise regularly with stable weight.    Past Medical History  Diagnosis Date  . Diabetes mellitus   . Hyperlipidemia   . Hypertension   . Obesity   .  Unspecified venous (peripheral) insufficiency   . Hypokalemia   . Angioedema   . Allergic rhinitis   . History of hematuria 11/20/2007  . RBBB (right bundle branch block with left anterior fascicular block)   . Tobacco abuse     Stopped in 2002. Now smokes 1.5ppd  . OSA on CPAP   . Pericarditis 1988  . Renal insufficiency     Cr 1.2 baseline  . Bunion    Current Outpatient Prescriptions  Medication Sig Dispense Refill  . ACCU-CHEK FASTCLIX LANCETS MISC USE AS DIRECTED  102 each  3  . amLODipine (NORVASC) 10 MG tablet TAKE 1 TABLET BY MOUTH EVERY DAY  90 tablet  1  . aspirin 81 MG tablet Take 1 tablet (81 mg total) by mouth daily.  30 tablet  11  . atenolol (TENORMIN) 50 MG tablet TAKE 1 TABLET BY MOUTH EVERY DAY  90 tablet  1  . atorvastatin (LIPITOR) 80 MG tablet Take 1 tablet (80 mg total) by mouth daily.  90 tablet  3  . Elastic Bandages & Supports (V-5 HIGH COMPRESSION HOSE) MISC 1pair, Wear them while driving, walking, standing  1 each  1  . glucose blood test strip Test sugar twice a day - AM fasting and 2 hours after largest meal.  100 each  11  . glucose monitoring kit (FREESTYLE) monitoring kit 1 each by Does not apply route as needed for other. To test CBG at least twice a  day. Needs Accucheck meter.  1 each  0  . metFORMIN (GLUCOPHAGE) 1000 MG tablet TAKE 1 TABLET BY MOUTH TWICE DAILY WITH A MEAL  180 tablet  1  . oxymetazoline (AFRIN) 0.05 % nasal spray Place 2 sprays into the nose as needed.      . tamsulosin (FLOMAX) 0.4 MG CAPS capsule Take 1 capsule (0.4 mg total) by mouth daily after breakfast.  90 capsule  0  . triamterene-hydrochlorothiazide (MAXZIDE-25) 37.5-25 MG per tablet TAKE 1 TABLET BY MOUTH DAILY  90 tablet  1   No current facility-administered medications for this visit.   Family History  Problem Relation Age of Onset  . Diabetes Father   . Heart disease Father   . Hypertension Father   . Colon cancer Maternal Grandfather   . Colon polyps Maternal  Grandfather   . Ovarian cancer Maternal Aunt   . Hypertension Mother    History   Social History  . Marital Status: Divorced    Spouse Name: N/A    Number of Children: N/A  . Years of Education: N/A   Social History Main Topics  . Smoking status: Former Smoker -- 0.50 packs/day    Types: Cigarettes    Quit date: 06/10/2012  . Smokeless tobacco: Never Used     Comment: given quit line info  . Alcohol Use: No  . Drug Use: No  . Sexual Activity: Not on file   Other Topics Concern  . Not on file   Social History Narrative   Works as a Administrator.   Smokes 1.5 ppd.    Review of Systems: Review of Systems  Constitutional: Negative for fever, chills, weight loss and malaise/fatigue.  HENT: Positive for congestion. Negative for sore throat.        Sinus pressure, rhinorrhea  Eyes: Negative for blurred vision.  Respiratory: Negative for cough, sputum production, shortness of breath and wheezing.   Cardiovascular: Negative for chest pain, palpitations and leg swelling.  Gastrointestinal: Negative for heartburn, nausea, vomiting, abdominal pain, diarrhea, constipation and blood in stool.  Genitourinary: Negative for dysuria, urgency, frequency and hematuria.  Musculoskeletal: Negative for joint pain and myalgias.  Skin: Negative for rash.  Neurological: Negative for dizziness, sensory change, weakness and headaches.  Endo/Heme/Allergies: Positive for environmental allergies. Negative for polydipsia.  Psychiatric/Behavioral: Negative for substance abuse.     Objective:  Physical Exam: Filed Vitals:   10/22/13 1557  BP: 142/91  Pulse: 89  Temp: 98.7 F (37.1 C)  TempSrc: Oral  Weight: 347 lb 4.8 oz (157.534 kg)  SpO2: 96%   Physical Exam  Constitutional: He is oriented to person, place, and time. He appears well-developed and well-nourished. No distress.  HENT:  Head: Normocephalic and atraumatic.  Right Ear: External ear normal.  Left Ear: External ear normal.    Nose: Nose normal.  Mouth/Throat: Oropharynx is clear and moist. No oropharyngeal exudate.  Boggy nasal turbinates, no sinus or facial tenderness to palpation   Eyes: Conjunctivae and EOM are normal. Pupils are equal, round, and reactive to light. Right eye exhibits no discharge. Left eye exhibits no discharge. No scleral icterus.  Red watery eyes  Neck: Normal range of motion. Neck supple.  Cardiovascular: Normal rate, regular rhythm and normal heart sounds.   Pulmonary/Chest: Effort normal and breath sounds normal. No respiratory distress. He has no wheezes. He has no rales.  Abdominal: Soft. Bowel sounds are normal. He exhibits no distension. There is no tenderness. There is no rebound and no  guarding.  Musculoskeletal: Normal range of motion. He exhibits edema (trace nonpitting b/l LE ).  Neurological: He is alert and oriented to person, place, and time.  Skin: Skin is warm and dry. No rash noted. He is not diaphoretic. No erythema. No pallor.  Psychiatric: He has a normal mood and affect. His behavior is normal. Judgment and thought content normal.    Assessment & Plan:   Please see problem list for problem-based assessment and plan

## 2013-10-22 NOTE — Patient Instructions (Addendum)
-Start using zyrtec 10 mg daily -Start using flonase 2 sprays in each nostril daily -Use your neti pot 1-2 times a day  -Stop using afrin, it may lead to increased congestion at first  -Avoid allergy triggers -Will see you back in 2 weeks  Allergic Rhinitis Allergic rhinitis is when the mucous membranes in the nose respond to allergens. Allergens are particles in the air that cause your body to have an allergic reaction. This causes you to release allergic antibodies. Through a chain of events, these eventually cause you to release histamine into the blood stream. Although meant to protect the body, it is this release of histamine that causes your discomfort, such as frequent sneezing, congestion, and an itchy, runny nose.  CAUSES  Seasonal allergic rhinitis (hay fever) is caused by pollen allergens that may come from grasses, trees, and weeds. Year-round allergic rhinitis (perennial allergic rhinitis) is caused by allergens such as house dust mites, pet dander, and mold spores.  SYMPTOMS   Nasal stuffiness (congestion).  Itchy, runny nose with sneezing and tearing of the eyes. DIAGNOSIS  Your health care provider can help you determine the allergen or allergens that trigger your symptoms. If you and your health care provider are unable to determine the allergen, skin or blood testing may be used. TREATMENT  Allergic rhinitis does not have a cure, but it can be controlled by:  Medicines and allergy shots (immunotherapy).  Avoiding the allergen. Hay fever may often be treated with antihistamines in pill or nasal spray forms. Antihistamines block the effects of histamine. There are over-the-counter medicines that may help with nasal congestion and swelling around the eyes. Check with your health care provider before taking or giving this medicine.  If avoiding the allergen or the medicine prescribed do not work, there are many new medicines your health care provider can prescribe. Stronger  medicine may be used if initial measures are ineffective. Desensitizing injections can be used if medicine and avoidance does not work. Desensitization is when a patient is given ongoing shots until the body becomes less sensitive to the allergen. Make sure you follow up with your health care provider if problems continue. HOME CARE INSTRUCTIONS It is not possible to completely avoid allergens, but you can reduce your symptoms by taking steps to limit your exposure to them. It helps to know exactly what you are allergic to so that you can avoid your specific triggers. SEEK MEDICAL CARE IF:   You have a fever.  You develop a cough that does not stop easily (persistent).  You have shortness of breath.  You start wheezing.  Symptoms interfere with normal daily activities. Document Released: 01/09/2001 Document Revised: 04/21/2013 Document Reviewed: 12/22/2012 [facility] Patient Information 2015 [place], [place]. This information is not intended to replace advice given to you by your health care provider. Make sure you discuss any questions you have with your health care provider.   General Instructions:   Please try to bring all your medicines next time. This will help [place] keep you safe from mistakes.   Progress Toward Treatment Goals:  Treatment Goal 09/16/2013  Hemoglobin A1C at goal  Blood pressure unchanged  Stop smoking -    Self Care Goals & Plans:  Self Care Goal 09/16/2013  Manage my medications take my medicines as prescribed; bring my medications to every visit; refill my medications on time  Monitor my health -  Eat healthy foods drink diet soda or water instead of juice or soda; eat more vegetables;  eat foods that are low in salt; eat baked foods instead of fried foods; eat fruit for snacks and desserts  Be physically active -  Stop smoking -  Meeting treatment goals -    Home Blood Glucose Monitoring 09/16/2013  Check my blood sugar 2 times a day  When to check my  blood sugar -     Care Management & Community Referrals:  Referral 01/14/2013  Referrals made for care management support diabetes educator

## 2013-10-23 ENCOUNTER — Encounter: Payer: Self-pay | Admitting: Internal Medicine

## 2013-10-23 NOTE — Progress Notes (Signed)
Case discussed with Dr. Rabbani at the time of the visit.  We reviewed the resident's history and exam and pertinent patient test results.  I agree with the assessment, diagnosis, and plan of care documented in the resident's note. 

## 2013-10-23 NOTE — Assessment & Plan Note (Signed)
Assessment: Pt with well-controlled non-insulin Type II DM with last A1c 6.7 on 09/16/13 compliant with oral hypoglycemic therapy who presents with no recent symptomatic hypoglycemia.  Plan: -Last A1c 6.7 at goal <7, continue metformin 1000 mg daily  -BP 142/91 near goal <140/90, continue amlodipine 10 mg daily, continue atenolol 50 mg daily, and triamterene-HCTZ 37.5-25 mg daily -LDL 106 not at goal <100, continue atorvastatin 80 mg daily  -Last annual foot exam on 01/14/13 and last annual eye exam on 10/08/13 -Last urine microalbumin test on 09/16/13 -BMI 47.77 not at goal <25, encourage weight loss -Continue aspirin 81 mg daily for primary CVD prevention

## 2013-10-23 NOTE — Assessment & Plan Note (Addendum)
Assessment: Pt with moderate to well-controlled hypertension compliant with three-class (BB, CCB, diuretic) anti-hypertensive therapy who presents with blood pressure of 142/91.   Plan:  -BP 142/91 near goal <140/90 -Continue amlodipine 10 mg daily -Continue atenolol 50 mg daily -Continue triamterene-HCTZ 37.5-25 mg daily  -Last BMP 09/16/13

## 2013-10-23 NOTE — Assessment & Plan Note (Addendum)
Assessment: Pt with history of seasonal allergic rhinitis noncompliant with INS therapy with temporarily relief with nasal decongestant therapy who presents with persistent rhinorrhea and nasal congestion of 2 month duration. Etiology also possible due to chronic sinusitis in setting of nasal obstruction from deviated septum from past history of broken nose.       Plan:  -Discontinue Afrin due to risk of arrythmia with prolonged use and rebound congestion -Start cetrizine 10 mg daily  -Start fluticasone 2 sprays in each nostril daily   -Continue neti pot saline irrigation at least daily  -To return in 2 weeks, if not improved consider starting intranasal anti-histamine or leukotriene receptor antagonist -Consider CT sinuses in setting of possible chronic sinusitis from nasal obstruction if no improvement -Consider allergy/immunology referral for skin testing -Consider IgE testing at next visit

## 2013-11-19 ENCOUNTER — Other Ambulatory Visit: Payer: Self-pay | Admitting: Internal Medicine

## 2014-01-27 ENCOUNTER — Ambulatory Visit (INDEPENDENT_AMBULATORY_CARE_PROVIDER_SITE_OTHER): Payer: Medicaid Other | Admitting: Podiatry

## 2014-01-27 ENCOUNTER — Encounter: Payer: Self-pay | Admitting: Podiatry

## 2014-01-27 DIAGNOSIS — B351 Tinea unguium: Secondary | ICD-10-CM

## 2014-01-27 DIAGNOSIS — M79676 Pain in unspecified toe(s): Secondary | ICD-10-CM

## 2014-01-27 DIAGNOSIS — M79609 Pain in unspecified limb: Secondary | ICD-10-CM

## 2014-01-28 NOTE — Progress Notes (Signed)
Patient ID: Jeremiah Conway, male   DOB: [birth year], 58 y.o.   MRN: [id number]  Subjective: This patient presents again complaining of painful toenails  Objective: Orientated x3 The toenails are elongated, hypertrophic, discolored, incurvated 6-10  Assessment: Symptomatic onychomycoses x10  Plan: Debrided toenails x10 without a bleeding  Reappoint x3 months

## 2014-02-24 ENCOUNTER — Encounter: Payer: Medicaid Other | Admitting: Internal Medicine

## 2014-03-24 ENCOUNTER — Encounter: Payer: Self-pay | Admitting: Internal Medicine

## 2014-03-24 ENCOUNTER — Ambulatory Visit (HOSPITAL_COMMUNITY)
Admission: RE | Admit: 2014-03-24 | Discharge: 2014-03-24 | Disposition: A | Discharge status: Home / Self Care | Payer: Medicaid Other | Source: Ambulatory Visit | Attending: Internal Medicine | Admitting: Internal Medicine

## 2014-03-24 ENCOUNTER — Ambulatory Visit (INDEPENDENT_AMBULATORY_CARE_PROVIDER_SITE_OTHER): Payer: Medicaid Other | Admitting: Internal Medicine

## 2014-03-24 VITALS — BP 120/71 | HR 110 | Temp 98.3°F | Ht 72.0 in | Wt 344.9 lb

## 2014-03-24 DIAGNOSIS — Z299 Encounter for prophylactic measures, unspecified: Secondary | ICD-10-CM

## 2014-03-24 DIAGNOSIS — R000 Tachycardia, unspecified: Secondary | ICD-10-CM

## 2014-03-24 DIAGNOSIS — Z6841 Body Mass Index (BMI) 40.0 and over, adult: Secondary | ICD-10-CM

## 2014-03-24 DIAGNOSIS — Z418 Encounter for other procedures for purposes other than remedying health state: Secondary | ICD-10-CM

## 2014-03-24 DIAGNOSIS — E1129 Type 2 diabetes mellitus with other diabetic kidney complication: Secondary | ICD-10-CM

## 2014-03-24 DIAGNOSIS — I10 Essential (primary) hypertension: Secondary | ICD-10-CM

## 2014-03-24 DIAGNOSIS — G4733 Obstructive sleep apnea (adult) (pediatric): Secondary | ICD-10-CM

## 2014-03-24 DIAGNOSIS — I872 Venous insufficiency (chronic) (peripheral): Secondary | ICD-10-CM

## 2014-03-24 DIAGNOSIS — E1149 Type 2 diabetes mellitus with other diabetic neurological complication: Secondary | ICD-10-CM

## 2014-03-24 DIAGNOSIS — Z23 Encounter for immunization: Secondary | ICD-10-CM

## 2014-03-24 LAB — COMPLETE METABOLIC PANEL WITH GFR
ALT: 15 U/L (ref 0–53)
AST: 14 U/L (ref 0–37)
Albumin: 4.2 g/dL (ref 3.5–5.2)
Alkaline Phosphatase: 61 U/L (ref 39–117)
BUN: 13 mg/dL (ref 6–23)
CALCIUM: 9.8 mg/dL (ref 8.4–10.5)
CO2: 26 mEq/L (ref 19–32)
Chloride: 102 mEq/L (ref 96–112)
Creat: 1.38 mg/dL — ABNORMAL HIGH (ref 0.50–1.35)
GFR, Est African American: 65 mL/min
GFR, Est Non African American: 56 mL/min — ABNORMAL LOW
Glucose, Bld: 66 mg/dL — ABNORMAL LOW (ref 70–99)
POTASSIUM: 3.8 meq/L (ref 3.5–5.3)
Sodium: 141 mEq/L (ref 135–145)
Total Bilirubin: 0.5 mg/dL (ref 0.2–1.2)
Total Protein: 7.5 g/dL (ref 6.0–8.3)

## 2014-03-24 LAB — CBC
HCT: 42.9 % (ref 39.0–52.0)
Hemoglobin: 14.7 g/dL (ref 13.0–17.0)
MCH: 27 pg (ref 26.0–34.0)
MCHC: 34.3 g/dL (ref 30.0–36.0)
MCV: 78.9 fL (ref 78.0–100.0)
MPV: 10.4 fL (ref 9.4–12.4)
PLATELETS: 239 10*3/uL (ref 150–400)
RBC: 5.44 MIL/uL (ref 4.22–5.81)
RDW: 16.3 % — ABNORMAL HIGH (ref 11.5–15.5)
WBC: 7 10*3/uL (ref 4.0–10.5)

## 2014-03-24 LAB — TSH: TSH: 1.607 u[IU]/mL (ref 0.350–4.500)

## 2014-03-24 LAB — GLUCOSE, CAPILLARY: GLUCOSE-CAPILLARY: 100 mg/dL — AB (ref 70–99)

## 2014-03-24 LAB — POCT GLYCOSYLATED HEMOGLOBIN (HGB A1C): HEMOGLOBIN A1C: 6.6

## 2014-03-24 NOTE — Progress Notes (Signed)
   Subjective:    Patient ID: Jeremiah Conway, male    DOB: [birth year], 58 y.o.   MRN: [id number]  HPI Comments: Jeremiah Conway is a 58 year old male with a PMH of HTN, OSA, chronic venous insufficiency, DM type 2, OSA, CKD3 and HLD here for routine follow-up.  Please see problem based charting for update of his chronic medical problems.      Review of Systems  Constitutional: Negative for fever, chills, appetite change and unexpected weight change.  Respiratory: Negative for cough and shortness of breath.   Cardiovascular: Negative for chest pain, palpitations and leg swelling.  Gastrointestinal: Negative for nausea, vomiting, diarrhea, constipation and blood in stool.  Endocrine: Negative for polydipsia and polyuria.  Genitourinary: Negative for dysuria and hematuria.  Neurological: Negative for syncope, weakness, light-headedness and headaches.  Psychiatric/Behavioral: Negative for dysphoric mood.       Objective:   Physical Exam  Constitutional: He is oriented to person, place, and time. He appears well-developed. No distress.  HENT:  Head: Normocephalic and atraumatic.  Nose: Nose normal.  Mouth/Throat: Oropharynx is clear and moist. No oropharyngeal exudate.  Eyes: EOM are normal. Pupils are equal, round, and reactive to light.  Cardiovascular: Normal rate, regular rhythm and normal heart sounds.  Exam reveals no gallop and no friction rub.   No murmur heard. Pulmonary/Chest: Effort normal and breath sounds normal. No respiratory distress. He has no wheezes. He has no rales.  Abdominal: Soft. Bowel sounds are normal. He exhibits no distension. There is no tenderness. There is no rebound.  Musculoskeletal: Normal range of motion. He exhibits no edema or tenderness.  Neurological: He is alert and oriented to person, place, and time. No cranial nerve deficit.  Skin: Skin is warm. He is not diaphoretic.  Psychiatric: He has a normal mood and affect. His behavior is normal.  Vitals  reviewed.         Assessment & Plan:  Please see problem based assessment and plan.

## 2014-03-24 NOTE — Assessment & Plan Note (Signed)
He is intermittently compliant with BiPAP.  Says he tried it for a few months but has not used in several months.  Notes discomfort is the primary issue.  His mouth and nose get too dry.  I explained that he stops breathing when he does not sleeps and he is aware.  He says he had a close friend die due to sleep apnea.  - he agrees to follow-up for refitting

## 2014-03-24 NOTE — Assessment & Plan Note (Signed)
He continues to wear compression stockings with good results.

## 2014-03-24 NOTE — Assessment & Plan Note (Signed)
BP Readings from Last 3 Encounters:  03/24/14 146/93  10/22/13 142/91  10/19/13 138/88    Lab Results  Component Value Date   NA 142 09/16/2013   K 3.9 09/16/2013   CREATININE 1.36* 09/16/2013    Assessment: Blood pressure control:  slightly elevated Progress toward BP goal:   near goal Comments: He says "doctors scare me" and alludes to white coat hypertension.  Plan: Medications:  continue current medications:  Triamterene-HCTZ 37.5-25mg  daily Educational resources provided: brochure (has information) Other plans:

## 2014-03-24 NOTE — Assessment & Plan Note (Addendum)
HR 108 and then 110 on repeat.  EKG shows sinus tachy.  Also known RBBB and evidence of LVH.  ? ST depression in I, V5-V6 however this may be related to tachy.  He denies chest pain, palpitations, dyspnea, lightheadedness. - CBC, TSH today - he agrees to check his pulse/BP several times in the next week and call me if pulse consistently > 100 - return in 1 week to check pulse and repeat EKG

## 2014-03-24 NOTE — Assessment & Plan Note (Signed)
Down 3 pounds since last visit.  Joined [employer].  Eating well balanced meals.

## 2014-03-24 NOTE — Assessment & Plan Note (Addendum)
Lab Results  Component Value Date   HGBA1C 6.6 03/24/2014   HGBA1C 6.7 09/16/2013   HGBA1C 8.2 01/14/2013     Assessment: Diabetes control:  well controlled Progress toward A1C goal:   at goal Comments: compliant with metformin but admits he does not take it if CBG is 100 or less he does not take the metformin (says he has skipped it about 3 times this month).  He feels that he needs to eat when he sees a CBG of 100.  He starts to feel low around 60s but has not had any low recently.  He has not been in the 60 is about 3-4 months.   He likes beans, lean chicken, vegetables.  He avoids sugar-sweetened beverages.   Drinks mostly coffee and water.  Initially reported occasional blurry vision after taking metformin but then admitted he is trying to get off of some of the medications he is taking.  Vision is 20/15 B/L in clinic.  I advised him of the benefit of continuing therapy to keep his A1c < 7.  He is starting to work out and hopefully with weight loss and continued lifestyle modification he may be able to reduce medications at some point.     Plan: Medications:  continue current medications:  Metformin 1000mg  BID Home glucose monitoring:  2-3x per day Frequency:   Timing:   Instruction/counseling given: reminded to bring blood glucose meter & log to each visit Educational resources provided: brochure (has information) Self management tools provided: copy of home glucose meter download Other plans: He has joined [employer] and plans to start working out.  I advised taking it slow and starting with walking and light weights.  He will continue to watch diet.  Return to clinic in 3 months.

## 2014-03-24 NOTE — Patient Instructions (Signed)
General Instructions:  1. I will check your labs today.  I will call if there are problems.  Please call [facility] for [facility] with your [facility]AP.   2. Please take all medications as prescribed.     3. If you have worsening of your symptoms or new symptoms arise, please call the clinic ([phone]), or go to the ER immediately if symptoms are severe.   Return to see me in 3 months.   Treatment Goals:  Goals (1 Years of Data) as of 03/24/14          As of Today 10/22/13 10/19/13 09/16/13 07/13/13     Blood Pressure   . Blood Pressure < 130/80  146/93 142/91 138/88 141/91 141/101     Diet   . Eat more fruits and vegetables           Exercise   . Exercise 3x per week (30 min per time)           Lifestyle   . Quit smoking / using tobacco           Result Component   . HEMOGLOBIN A1C < 7.0  6.6   6.7    . LDL CALC < 100     106       Progress Toward Treatment Goals:  Treatment Goal 03/24/2014  Hemoglobin A1C at goal  Blood pressure -  Stop smoking -    Self Care Goals & Plans:  Self Care Goal 03/24/2014  Manage my medications take my medicines as prescribed; bring my medications to every visit; refill my medications on time  Monitor my health keep track of my blood glucose; bring my glucose meter and log to each visit  Eat healthy foods drink diet soda or water instead of juice or soda; eat more vegetables; eat foods that are low in salt; eat baked foods instead of fried foods; eat fruit for snacks and desserts  Be physically active -  Stop smoking -  Meeting treatment goals -    Home Blood Glucose Monitoring 03/24/2014  Check my blood sugar 2 times a day  When to check my blood sugar -     Care Management & Community Referrals:  Referral 01/14/2013  Referrals made for care management support diabetes educator

## 2014-03-26 NOTE — Progress Notes (Signed)
Internal Medicine Clinic Attending  Case discussed with Dr. Wilson soon after the resident saw the patient.  We reviewed the resident's history and exam and pertinent patient test results.  I agree with the assessment, diagnosis, and plan of care documented in the resident's note.  

## 2014-05-10 ENCOUNTER — Encounter: Payer: Self-pay | Admitting: Podiatry

## 2014-05-10 ENCOUNTER — Ambulatory Visit (INDEPENDENT_AMBULATORY_CARE_PROVIDER_SITE_OTHER): Payer: Medicaid Other | Admitting: Podiatry

## 2014-05-10 DIAGNOSIS — B351 Tinea unguium: Secondary | ICD-10-CM

## 2014-05-10 DIAGNOSIS — M79676 Pain in unspecified toe(s): Secondary | ICD-10-CM

## 2014-05-10 NOTE — Patient Instructions (Signed)
Diabetes and Foot Care Diabetes may cause you to have problems because of poor blood supply (circulation) to your feet and legs. This may cause the skin on your feet to become thinner, break easier, and heal more slowly. Your skin may become dry, and the skin may peel and crack. You may also have nerve damage in your legs and feet causing decreased feeling in them. You may not notice minor injuries to your feet that could lead to infections or more serious problems. Taking care of your feet is one of the most important things you can do for yourself.  HOME CARE INSTRUCTIONS  Wear shoes at all times, even in the house. Do not go barefoot. Bare feet are easily injured.  Check your feet daily for blisters, cuts, and redness. If you cannot see the bottom of your feet, use a mirror or ask someone for help.  Wash your feet with warm water (do not use hot water) and mild soap. Then pat your feet and the areas between your toes until they are completely dry. Do not soak your feet as this can dry your skin.  Apply a moisturizing lotion or petroleum jelly (that does not contain alcohol and is unscented) to the skin on your feet and to dry, brittle toenails. Do not apply lotion between your toes.  Trim your toenails straight across. Do not dig under them or around the cuticle. File the edges of your nails with an emery board or nail file.  Do not cut corns or calluses or try to remove them with medicine.  Wear clean socks or stockings every day. Make sure they are not too tight. Do not wear knee-high stockings since they may decrease blood flow to your legs.  Wear shoes that fit properly and have enough cushioning. To break in new shoes, wear them for just a few hours a day. This prevents you from injuring your feet. Always look in your shoes before you put them on to be sure there are no objects inside.  Do not cross your legs. This may decrease the blood flow to your feet.  If you find a minor scrape,  cut, or break in the skin on your feet, keep it and the skin around it clean and dry. These areas may be cleansed with mild soap and water. Do not cleanse the area with peroxide, alcohol, or iodine.  When you remove an adhesive bandage, be sure not to damage the skin around it.  If you have a wound, look at it several times a day to make sure it is healing.  Do not use heating pads or hot water bottles. They may burn your skin. If you have lost feeling in your feet or legs, you may not know it is happening until it is too late.  Make sure your health care provider performs a complete foot exam at least annually or more often if you have foot problems. Report any cuts, sores, or bruises to your health care provider immediately. SEEK MEDICAL CARE IF:   You have an injury that is not healing.  You have cuts or breaks in the skin.  You have an ingrown nail.  You notice redness on your legs or feet.  You feel burning or tingling in your legs or feet.  You have pain or cramps in your legs and feet.  Your legs or feet are numb.  Your feet always feel cold. SEEK IMMEDIATE MEDICAL CARE IF:   There is increasing redness,   swelling, or pain in or around a wound.  There is a red line that goes up your leg.  Pus is coming from a wound.  You develop a fever or as directed by your health care provider.  You notice a bad smell coming from an ulcer or wound. Document Released: 04/13/2000 Document Revised: 12/17/2012 Document Reviewed: 09/23/2012 [employer] Patient Information 2015 [employer]. This information is not intended to replace advice given to you by your health care provider. Make sure you discuss any questions you have with your health care provider.  

## 2014-05-11 NOTE — Progress Notes (Signed)
Patient ID: Jeremiah Conway, male   DOB: 1955-10-03, 59 y.o.   MRN: [id number]  Subjective: Patient presents today complaining of painful toenails and walking wearing shoes  Objective: Orientated 3 The toenails are elongated, hypertrophic, discolored, incurvated and tender to palpation 6-10  Assessment: Symptomatic onychomycoses 6-10  Plan: Debridement of toenails 10 without a bleeding  Reappoint 3 months

## 2014-05-16 ENCOUNTER — Other Ambulatory Visit: Payer: Self-pay | Admitting: Internal Medicine

## 2014-07-04 ENCOUNTER — Other Ambulatory Visit: Payer: Self-pay | Admitting: Internal Medicine

## 2014-07-06 ENCOUNTER — Other Ambulatory Visit: Payer: Self-pay | Admitting: Internal Medicine

## 2014-08-01 ENCOUNTER — Other Ambulatory Visit: Payer: Self-pay | Admitting: Internal Medicine

## 2014-08-03 ENCOUNTER — Other Ambulatory Visit: Payer: Self-pay | Admitting: Internal Medicine

## 2014-08-04 ENCOUNTER — Other Ambulatory Visit: Payer: Self-pay | Admitting: *Deleted

## 2014-08-04 DIAGNOSIS — I10 Essential (primary) hypertension: Secondary | ICD-10-CM

## 2014-08-05 MED ORDER — TRIAMTERENE-HCTZ 37.5-25 MG PO TABS: ORAL_TABLET | ORAL | Status: DC

## 2014-08-09 ENCOUNTER — Encounter: Payer: Self-pay | Admitting: Podiatry

## 2014-08-09 ENCOUNTER — Ambulatory Visit (INDEPENDENT_AMBULATORY_CARE_PROVIDER_SITE_OTHER): Payer: Medicaid Other | Admitting: Podiatry

## 2014-08-09 DIAGNOSIS — M79676 Pain in unspecified toe(s): Secondary | ICD-10-CM

## 2014-08-09 DIAGNOSIS — B351 Tinea unguium: Secondary | ICD-10-CM

## 2014-08-09 NOTE — Progress Notes (Signed)
Patient ID: Jeremiah Conway, male   DOB: January 17, 1956, 59 y.o.   MRN: [id number]  Subjective: This patient presents today complaining of painful toenails and requests toenail debridement  Objective: The toenails are hypertrophic, elongated, discolored and tender to direct palpation 6-10  Assessment: Symptomatic onychomycoses 6-10  Plan: Debridement toenails 10 without any bleeding  Reappoint 3 months

## 2014-08-09 NOTE — Patient Instructions (Signed)
Diabetes and Foot Care Diabetes may cause you to have problems because of poor blood supply (circulation) to your feet and legs. This may cause the skin on your feet to become thinner, break easier, and heal more slowly. Your skin may become dry, and the skin may peel and crack. You may also have nerve damage in your legs and feet causing decreased feeling in them. You may not notice minor injuries to your feet that could lead to infections or more serious problems. Taking care of your feet is one of the most important things you can do for yourself.  HOME CARE INSTRUCTIONS  Wear shoes at all times, even in the house. Do not go barefoot. Bare feet are easily injured.  Check your feet daily for blisters, cuts, and redness. If you cannot see the bottom of your feet, use a mirror or ask someone for help.  Wash your feet with warm water (do not use hot water) and mild soap. Then pat your feet and the areas between your toes until they are completely dry. Do not soak your feet as this can dry your skin.  Apply a moisturizing lotion or petroleum jelly (that does not contain alcohol and is unscented) to the skin on your feet and to dry, brittle toenails. Do not apply lotion between your toes.  Trim your toenails straight across. Do not dig under them or around the cuticle. File the edges of your nails with an emery board or nail file.  Do not cut corns or calluses or try to remove them with medicine.  Wear clean socks or stockings every day. Make sure they are not too tight. Do not wear knee-high stockings since they may decrease blood flow to your legs.  Wear shoes that fit properly and have enough cushioning. To break in new shoes, wear them for just a few hours a day. This prevents you from injuring your feet. Always look in your shoes before you put them on to be sure there are no objects inside.  Do not cross your legs. This may decrease the blood flow to your feet.  If you find a minor scrape,  cut, or break in the skin on your feet, keep it and the skin around it clean and dry. These areas may be cleansed with mild soap and water. Do not cleanse the area with peroxide, alcohol, or iodine.  When you remove an adhesive bandage, be sure not to damage the skin around it.  If you have a wound, look at it several times a day to make sure it is healing.  Do not use heating pads or hot water bottles. They may burn your skin. If you have lost feeling in your feet or legs, you may not know it is happening until it is too late.  Make sure your health care provider performs a complete foot exam at least annually or more often if you have foot problems. Report any cuts, sores, or bruises to your health care provider immediately. SEEK MEDICAL CARE IF:   You have an injury that is not healing.  You have cuts or breaks in the skin.  You have an ingrown nail.  You notice redness on your legs or feet.  You feel burning or tingling in your legs or feet.  You have pain or cramps in your legs and feet.  Your legs or feet are numb.  Your feet always feel cold. SEEK IMMEDIATE MEDICAL CARE IF:   There is increasing redness,   swelling, or pain in or around a wound.  There is a red line that goes up your leg.  Pus is coming from a wound.  You develop a fever or as directed by your health care provider.  You notice a bad smell coming from an ulcer or wound. Document Released: 04/13/2000 Document Revised: 12/17/2012 Document Reviewed: 09/23/2012 [employer] Patient Information 2015 [employer]. This information is not intended to replace advice given to you by your health care provider. Make sure you discuss any questions you have with your health care provider.  

## 2014-09-12 ENCOUNTER — Other Ambulatory Visit: Payer: Self-pay | Admitting: Internal Medicine

## 2014-09-17 ENCOUNTER — Other Ambulatory Visit: Payer: Self-pay | Admitting: *Deleted

## 2014-09-17 MED ORDER — TAMSULOSIN HCL 0.4 MG PO CAPS: 0.4000 mg | ORAL_CAPSULE | Freq: Every day | ORAL | Status: DC

## 2014-09-29 ENCOUNTER — Other Ambulatory Visit: Payer: Self-pay | Admitting: *Deleted

## 2014-09-29 DIAGNOSIS — I10 Essential (primary) hypertension: Secondary | ICD-10-CM

## 2014-09-29 MED ORDER — ATENOLOL 50 MG PO TABS: ORAL_TABLET | ORAL | Status: DC

## 2014-10-04 ENCOUNTER — Other Ambulatory Visit: Payer: Self-pay | Admitting: Internal Medicine

## 2014-10-12 LAB — HM DIABETES EYE EXAM

## 2014-10-19 ENCOUNTER — Encounter: Payer: Self-pay | Admitting: *Deleted

## 2014-10-27 ENCOUNTER — Ambulatory Visit (INDEPENDENT_AMBULATORY_CARE_PROVIDER_SITE_OTHER): Payer: Medicaid Other | Admitting: Internal Medicine

## 2014-10-27 ENCOUNTER — Encounter: Payer: Medicaid Other | Admitting: Internal Medicine

## 2014-10-27 VITALS — BP 137/87 | HR 96 | Temp 98.0°F | Resp 20 | Ht 72.0 in | Wt 349.3 lb

## 2014-10-27 DIAGNOSIS — G4733 Obstructive sleep apnea (adult) (pediatric): Secondary | ICD-10-CM | POA: Diagnosis not present

## 2014-10-27 DIAGNOSIS — N289 Disorder of kidney and ureter, unspecified: Secondary | ICD-10-CM

## 2014-10-27 DIAGNOSIS — I872 Venous insufficiency (chronic) (peripheral): Secondary | ICD-10-CM

## 2014-10-27 DIAGNOSIS — Z0000 Encounter for general adult medical examination without abnormal findings: Secondary | ICD-10-CM

## 2014-10-27 DIAGNOSIS — Z6841 Body Mass Index (BMI) 40.0 and over, adult: Secondary | ICD-10-CM | POA: Diagnosis not present

## 2014-10-27 DIAGNOSIS — E1129 Type 2 diabetes mellitus with other diabetic kidney complication: Secondary | ICD-10-CM

## 2014-10-27 DIAGNOSIS — I10 Essential (primary) hypertension: Secondary | ICD-10-CM

## 2014-10-27 DIAGNOSIS — Z79899 Other long term (current) drug therapy: Secondary | ICD-10-CM

## 2014-10-27 LAB — GLUCOSE, CAPILLARY: Glucose-Capillary: 95 mg/dL (ref 65–99)

## 2014-10-27 LAB — POCT GLYCOSYLATED HEMOGLOBIN (HGB A1C): HEMOGLOBIN A1C: 6.5

## 2014-10-27 NOTE — Patient Instructions (Signed)
1. I will call you if there are problems with your labs.  Please call [facility] to get your CPAP/BiPAP supplies.    2. Please take all medications as prescribed.    3. If you have worsening of your symptoms or new symptoms arise, please call the clinic ([phone]), or go to the ER immediately if symptoms are severe.  Return to see me in 6 months or sooner if you have problems.

## 2014-10-27 NOTE — Assessment & Plan Note (Addendum)
BP Readings from Last 3 Encounters:  10/27/14 137/87  03/24/14 120/71  10/22/13 142/91    Lab Results  Component Value Date   NA 141 03/24/2014   K 3.8 03/24/2014   CREATININE 1.38* 03/24/2014    Assessment: Blood pressure control:  well controlled Progress toward BP goal:   at goal Comments: Compliant with medications.  Plan: Medications:  continue current medications:  triamterene-HCTZ 37.5-25mg  daily, amlodipine 10mg  daily, atenolol 50mg  daily Other plans: Work on weight loss.  RTC in 6 months.

## 2014-10-27 NOTE — Progress Notes (Signed)
Subjective:    Patient ID: Jeremiah Conway, male    DOB: Mar 19, 1956, 59 y.o.   MRN: [id number]  HPI Comments: Jeremiah Conway is a 59 year old male with a PMH as below here for follow-up of chronic conditions.  Please see problem based charting for assessment and plan.    Past Medical History  Diagnosis Date  . Diabetes mellitus   . Hyperlipidemia   . Hypertension   . Obesity   . Unspecified venous (peripheral) insufficiency   . Hypokalemia   . Angioedema   . Allergic rhinitis   . History of hematuria 11/20/2007  . RBBB (right bundle branch block with left anterior fascicular block)   . Tobacco abuse     Stopped in 2002. Now smokes 1.5ppd  . OSA on CPAP   . Pericarditis 1988  . Renal insufficiency     Cr 1.2 baseline  . Bunion    Current Outpatient Prescriptions on File Prior to Visit  Medication Sig Dispense Refill  . ACCU-CHEK AVIVA PLUS test strip USE TO TEST SUGAR TWICE DAILY. MORNING FASTING AND 2 HOURS AFTER LARGEST MEAL. 200 each 3  . ACCU-CHEK FASTCLIX LANCETS MISC USE AS DIRECTED 102 each 3  . amLODipine (NORVASC) 10 MG tablet TAKE 1 TABLET BY MOUTH EVERY DAY 90 tablet 1  . aspirin 81 MG tablet Take 1 tablet (81 mg total) by mouth daily. 30 tablet 11  . atenolol (TENORMIN) 50 MG tablet TAKE 1 TABLET BY MOUTH EVERY DAY 90 tablet 1  . atorvastatin (LIPITOR) 80 MG tablet TAKE 1 TABLET BY MOUTH EVERY DAY 90 tablet 1  . Cetirizine HCl 10 MG CAPS Take 1 capsule (10 mg total) by mouth daily. 30 capsule 3  . Elastic Bandages & Supports (V-5 HIGH COMPRESSION HOSE) MISC 1pair, Wear them while driving, walking, standing 1 each 1  . fluticasone (FLONASE) 50 MCG/ACT nasal spray Place 2 sprays into both nostrils daily. 16 g 2  . glucose blood test strip Test sugar twice a day - AM fasting and 2 hours after largest meal. 100 each 11  . glucose monitoring kit (FREESTYLE) monitoring kit 1 each by Does not apply route as needed for other. To test CBG at least twice a day. Needs Accucheck  meter. 1 each 0  . metFORMIN (GLUCOPHAGE) 1000 MG tablet TAKE 1 TABLET BY MOUTH TWICE DAILY WITH MEALS 180 tablet 1  . tamsulosin (FLOMAX) 0.4 MG CAPS capsule Take 1 capsule (0.4 mg total) by mouth daily after breakfast. 90 capsule 0  . triamterene-hydrochlorothiazide (MAXZIDE-25) 37.5-25 MG per tablet TAKE 1 TABLET BY MOUTH DAILY 90 tablet 0   No current facility-administered medications on file prior to visit.    Review of Systems  Constitutional: Negative for fever, chills and appetite change.  Respiratory: Negative for cough and shortness of breath.   Cardiovascular: Positive for leg swelling. Negative for chest pain and palpitations.  Gastrointestinal: Negative for nausea, vomiting, abdominal pain, diarrhea, constipation and blood in stool.  Genitourinary: Negative for dysuria, hematuria and difficulty urinating.  Neurological: Negative for syncope, weakness and light-headedness.  Psychiatric/Behavioral: Negative for dysphoric mood.       Filed Vitals:   10/27/14 1446  BP: 137/87  Pulse: 96  Temp: 98 F (36.7 C)  TempSrc: Oral  Resp: 20  Height: 6' (1.829 m)  Weight: 349 lb 4.8 oz (158.441 kg)  SpO2: 96%     Objective:   Physical Exam  Constitutional: He is oriented to person, place, and  time. He appears well-developed. No distress.  HENT:  Head: Normocephalic and atraumatic.  Mouth/Throat: Oropharynx is clear and moist. No oropharyngeal exudate.  Eyes: EOM are normal. Pupils are equal, round, and reactive to light.  Neck: Neck supple.  Cardiovascular: Normal rate, regular rhythm and normal heart sounds.  Exam reveals no gallop and no friction rub.   No murmur heard. Pulmonary/Chest: Effort normal and breath sounds normal. No respiratory distress. He has no wheezes. He has no rales.  Abdominal: Soft. Bowel sounds are normal. He exhibits no distension. There is no tenderness. There is no rebound and no guarding.  Musculoskeletal: Normal range of motion. He exhibits  edema. He exhibits no tenderness.  1+ pitting edema B/L  Neurological: He is alert and oriented to person, place, and time. No cranial nerve deficit.  Skin: Skin is warm. He is not diaphoretic.  Psychiatric: He has a normal mood and affect. His behavior is normal. Judgment and thought content normal.  Vitals reviewed.         Assessment & Plan:  Please see problem based charting for assessment and plan.

## 2014-10-27 NOTE — Assessment & Plan Note (Addendum)
Lab Results  Component Value Date   HGBA1C 6.5 10/27/2014   HGBA1C 6.6 03/24/2014   HGBA1C 6.7 09/16/2013     Assessment: Diabetes control: good control (HgbA1C at goal) Progress toward A1C goal:   well controlled Comments: Compliant with metformin.  Tolerating metformin.  Logs reviewed.  There are 53 values for the past month (he is testing twice per day on average).  Highest is 236, lowest is 90.  Average CBG is 132.  Plan: Medications:  continue current medications:  Metformin 1000mg  BID Home glucose monitoring:once daily Frequency:   Timing:   Instruction/counseling given: reminded to bring blood glucose meter & log to each visit Educational resources provided:   Self management tools provided:   Other plans:  Work on exercise, weight loss.  Check urine alb/cr today.  RTC 6 months for follow-up.

## 2014-10-27 NOTE — Assessment & Plan Note (Signed)
He has not been using his BiPAP.  I continue to explain that he needs to use this at night.  He asked how he can get new supplies and I advised he call [facility] to arrange supplies, refitting. - he agrees to follow-up

## 2014-10-28 LAB — COMPLETE METABOLIC PANEL WITH GFR
ALT: 16 U/L (ref 0–53)
AST: 16 U/L (ref 0–37)
Albumin: 4.3 g/dL (ref 3.5–5.2)
Alkaline Phosphatase: 57 U/L (ref 39–117)
BUN: 18 mg/dL (ref 6–23)
CO2: 30 mEq/L (ref 19–32)
CREATININE: 1.48 mg/dL — AB (ref 0.50–1.35)
Calcium: 9.7 mg/dL (ref 8.4–10.5)
Chloride: 100 mEq/L (ref 96–112)
GFR, EST AFRICAN AMERICAN: 59 mL/min — AB
GFR, Est Non African American: 51 mL/min — ABNORMAL LOW
Glucose, Bld: 82 mg/dL (ref 70–99)
Potassium: 3.8 mEq/L (ref 3.5–5.3)
SODIUM: 145 meq/L (ref 135–145)
Total Bilirubin: 0.5 mg/dL (ref 0.2–1.2)
Total Protein: 7.5 g/dL (ref 6.0–8.3)

## 2014-10-28 LAB — MICROALBUMIN / CREATININE URINE RATIO
Creatinine, Urine: 126.7 mg/dL
MICROALB/CREAT RATIO: 7.9 mg/g (ref 0.0–30.0)
Microalb, Ur: 1 mg/dL (ref <2.0)

## 2014-10-29 ENCOUNTER — Other Ambulatory Visit: Payer: Self-pay | Admitting: Internal Medicine

## 2014-10-29 ENCOUNTER — Encounter: Payer: Self-pay | Admitting: Internal Medicine

## 2014-10-29 DIAGNOSIS — J302 Other seasonal allergic rhinitis: Secondary | ICD-10-CM

## 2014-10-29 DIAGNOSIS — Z0000 Encounter for general adult medical examination without abnormal findings: Secondary | ICD-10-CM | POA: Insufficient documentation

## 2014-10-29 MED ORDER — ATORVASTATIN CALCIUM 80 MG PO TABS: 80.0000 mg | ORAL_TABLET | Freq: Every day | ORAL | Status: DC

## 2014-10-29 MED ORDER — METFORMIN HCL 1000 MG PO TABS: 1000.0000 mg | ORAL_TABLET | Freq: Two times a day (BID) | ORAL | Status: DC

## 2014-10-29 MED ORDER — TAMSULOSIN HCL 0.4 MG PO CAPS: 0.4000 mg | ORAL_CAPSULE | Freq: Every day | ORAL | Status: DC

## 2014-10-29 NOTE — Assessment & Plan Note (Addendum)
Colonoscopy:  05/2012, due 05/2022 Eye exam: 09/2014, due 09/2015 HIV screening:  He declines today. PSA:  Jeremiah Conway asked about prostate cancer screening early in the visit but our discussion turned to other topics and I forgot to return to PSA topic.  Review of chart reveals normal PSA in 2012.  At next visit, will plan to discuss risk vs benefit and re-screen with PSA if he desires since recommended interval is 2-4 years (when decision is made to screen).

## 2014-10-29 NOTE — Assessment & Plan Note (Signed)
Trace - 1+ B/L LE edema today but he is not wearing his compression stockings and he has been sitting in the office with legs down.  He says he does have them and normally wears them and tries to elevate legs. - continue compression stockings and elevation

## 2014-10-29 NOTE — Progress Notes (Signed)
INTERNAL MEDICINE TEACHING ATTENDING ADDENDUM - Carrin Vannostrand, MD: I reviewed and discussed at the time of visit with the resident Dr. Wilson, the patient's medical history, physical examination, diagnosis and results of pertinent tests and treatment and I agree with the patient's care as documented.  

## 2014-10-29 NOTE — Assessment & Plan Note (Signed)
He has gained 5 pounds in the past 7 months since I last saw him.  He has not been going to the gym although he has a Research scientist (physical sciences) and transportation.  We discussed barriers and it seems to be lack of motivation to go.  I suggested finding a partner to work out with.

## 2014-11-03 ENCOUNTER — Other Ambulatory Visit (INDEPENDENT_AMBULATORY_CARE_PROVIDER_SITE_OTHER): Payer: Medicaid Other

## 2014-11-03 ENCOUNTER — Telehealth: Payer: Self-pay | Admitting: *Deleted

## 2014-11-03 ENCOUNTER — Other Ambulatory Visit: Payer: Self-pay | Admitting: Internal Medicine

## 2014-11-03 DIAGNOSIS — R748 Abnormal levels of other serum enzymes: Secondary | ICD-10-CM

## 2014-11-03 DIAGNOSIS — R7989 Other specified abnormal findings of blood chemistry: Secondary | ICD-10-CM

## 2014-11-03 LAB — BASIC METABOLIC PANEL WITH GFR
BUN: 16 mg/dL (ref 6–23)
CO2: 29 mEq/L (ref 19–32)
Calcium: 9.6 mg/dL (ref 8.4–10.5)
Chloride: 105 mEq/L (ref 96–112)
Creat: 1.35 mg/dL (ref 0.50–1.35)
GFR, EST AFRICAN AMERICAN: 66 mL/min
GFR, EST NON AFRICAN AMERICAN: 57 mL/min — AB
GLUCOSE: 126 mg/dL — AB (ref 70–99)
Potassium: 3.9 mEq/L (ref 3.5–5.3)
SODIUM: 145 meq/L (ref 135–145)

## 2014-11-03 NOTE — Telephone Encounter (Signed)
Call to patient to advise him not to take Nsaids gave examples.  Only to take ASA if needed and to drink plenty of fluids.  Patient voiced understanding of.  Angelina Ok, RN 11/03/2014 11:00 AM

## 2014-11-07 ENCOUNTER — Other Ambulatory Visit: Payer: Self-pay | Admitting: Internal Medicine

## 2014-11-10 ENCOUNTER — Encounter: Payer: Medicaid Other | Admitting: Internal Medicine

## 2014-11-10 ENCOUNTER — Other Ambulatory Visit: Payer: Self-pay | Admitting: Internal Medicine

## 2014-11-15 ENCOUNTER — Ambulatory Visit: Payer: Medicaid Other | Admitting: Podiatry

## 2014-11-16 ENCOUNTER — Ambulatory Visit: Payer: Medicaid Other | Admitting: Podiatry

## 2014-12-14 ENCOUNTER — Encounter: Payer: Self-pay | Admitting: Podiatry

## 2014-12-14 ENCOUNTER — Ambulatory Visit (INDEPENDENT_AMBULATORY_CARE_PROVIDER_SITE_OTHER): Payer: Medicaid Other | Admitting: Podiatry

## 2014-12-14 DIAGNOSIS — M79676 Pain in unspecified toe(s): Secondary | ICD-10-CM | POA: Diagnosis not present

## 2014-12-14 DIAGNOSIS — B351 Tinea unguium: Secondary | ICD-10-CM

## 2014-12-14 NOTE — Progress Notes (Signed)
Patient ID: Jeremiah Conway, male   DOB: [birth year], 59 y.o.   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

## 2014-12-14 NOTE — Patient Instructions (Signed)
Diabetes and Foot Care Diabetes may cause you to have problems because of poor blood supply (circulation) to your feet and legs. This may cause the skin on your feet to become thinner, break easier, and heal more slowly. Your skin may become dry, and the skin may peel and crack. You may also have nerve damage in your legs and feet causing decreased feeling in them. You may not notice minor injuries to your feet that could lead to infections or more serious problems. Taking care of your feet is one of the most important things you can do for yourself.  HOME CARE INSTRUCTIONS  Wear shoes at all times, even in the house. Do not go barefoot. Bare feet are easily injured.  Check your feet daily for blisters, cuts, and redness. If you cannot see the bottom of your feet, use a mirror or ask someone for help.  Wash your feet with warm water (do not use hot water) and mild soap. Then pat your feet and the areas between your toes until they are completely dry. Do not soak your feet as this can dry your skin.  Apply a moisturizing lotion or petroleum jelly (that does not contain alcohol and is unscented) to the skin on your feet and to dry, brittle toenails. Do not apply lotion between your toes.  Trim your toenails straight across. Do not dig under them or around the cuticle. File the edges of your nails with an emery board or nail file.  Do not cut corns or calluses or try to remove them with medicine.  Wear clean socks or stockings every day. Make sure they are not too tight. Do not wear knee-high stockings since they may decrease blood flow to your legs.  Wear shoes that fit properly and have enough cushioning. To break in new shoes, wear them for just a few hours a day. This prevents you from injuring your feet. Always look in your shoes before you put them on to be sure there are no objects inside.  Do not cross your legs. This may decrease the blood flow to your feet.  If you find a minor scrape,  cut, or break in the skin on your feet, keep it and the skin around it clean and dry. These areas may be cleansed with mild soap and water. Do not cleanse the area with peroxide, alcohol, or iodine.  When you remove an adhesive bandage, be sure not to damage the skin around it.  If you have a wound, look at it several times a day to make sure it is healing.  Do not use heating pads or hot water bottles. They may burn your skin. If you have lost feeling in your feet or legs, you may not know it is happening until it is too late.  Make sure your health care provider performs a complete foot exam at least annually or more often if you have foot problems. Report any cuts, sores, or bruises to your health care provider immediately. SEEK MEDICAL CARE IF:   You have an injury that is not healing.  You have cuts or breaks in the skin.  You have an ingrown nail.  You notice redness on your legs or feet.  You feel burning or tingling in your legs or feet.  You have pain or cramps in your legs and feet.  Your legs or feet are numb.  Your feet always feel cold. SEEK IMMEDIATE MEDICAL CARE IF:   There is increasing redness,   swelling, or pain in or around a wound.  There is a red line that goes up your leg.  Pus is coming from a wound.  You develop a fever or as directed by your health care provider.  You notice a bad smell coming from an ulcer or wound. Document Released: 04/13/2000 Document Revised: 12/17/2012 Document Reviewed: 09/23/2012 [employer] Patient Information 2015 [employer]. This information is not intended to replace advice given to you by your health care provider. Make sure you discuss any questions you have with your health care provider.  

## 2014-12-17 ENCOUNTER — Emergency Department (INDEPENDENT_AMBULATORY_CARE_PROVIDER_SITE_OTHER)
Admission: EM | Admit: 2014-12-17 | Discharge: 2014-12-17 | Disposition: A | Discharge status: Home / Self Care | Payer: Self-pay | Source: Home / Self Care | Attending: Family Medicine | Admitting: Family Medicine

## 2014-12-17 ENCOUNTER — Encounter (HOSPITAL_COMMUNITY): Payer: Self-pay | Admitting: Emergency Medicine

## 2014-12-17 DIAGNOSIS — S39012A Strain of muscle, fascia and tendon of lower back, initial encounter: Secondary | ICD-10-CM

## 2014-12-17 DIAGNOSIS — S86812A Strain of other muscle(s) and tendon(s) at lower leg level, left leg, initial encounter: Secondary | ICD-10-CM

## 2014-12-17 DIAGNOSIS — S161XXA Strain of muscle, fascia and tendon at neck level, initial encounter: Secondary | ICD-10-CM

## 2014-12-17 DIAGNOSIS — S86912A Strain of unspecified muscle(s) and tendon(s) at lower leg level, left leg, initial encounter: Secondary | ICD-10-CM

## 2014-12-17 MED ORDER — TRAMADOL HCL 50 MG PO TABS: 50.0000 mg | ORAL_TABLET | Freq: Four times a day (QID) | ORAL | Status: DC | PRN

## 2014-12-17 MED ORDER — DICLOFENAC SODIUM 1 % TD GEL: 1.0000 "application " | Freq: Four times a day (QID) | TRANSDERMAL | Status: DC

## 2014-12-17 NOTE — Discharge Instructions (Signed)
Back Pain, Adult °Low back pain is very common. About 1 in 5 people have back pain. The cause of low back pain is rarely dangerous. The pain often gets better over time. About half of people with a sudden onset of back pain feel better in just 2 weeks. About 8 in 10 people feel better by 6 weeks.  °CAUSES °Some common causes of back pain include: °· Strain of the muscles or ligaments supporting the spine. °· Wear and tear (degeneration) of the spinal discs. °· Arthritis. °· Direct injury to the back. °DIAGNOSIS °Most of the time, the direct cause of low back pain is not known. However, back pain can be treated effectively even when the exact cause of the pain is unknown. Answering your caregiver's questions about your overall health and symptoms is one of the most accurate ways to make sure the cause of your pain is not dangerous. If your caregiver needs more information, he or she may order lab work or imaging tests (X-rays or MRIs). However, even if imaging tests show changes in your back, this usually does not require surgery. °HOME CARE INSTRUCTIONS °For many people, back pain returns. Since low back pain is rarely dangerous, it is often a condition that people can learn to manage on their own.  °· Remain active. It is stressful on the back to sit or stand in one place. Do not sit, drive, or stand in one place for more than 30 minutes at a time. Take short walks on level surfaces as soon as pain allows. Try to increase the length of time you walk each day. °· Do not stay in bed. Resting more than 1 or 2 days can delay your recovery. °· Do not avoid exercise or work. Your body is made to move. It is not dangerous to be active, even though your back may hurt. Your back will likely heal faster if you return to being active before your pain is gone. °· Pay attention to your body when you  bend and lift. Many people have less discomfort when lifting if they bend their knees, keep the load close to their bodies, and  avoid twisting. Often, the most comfortable positions are those that put less stress on your recovering back. °· Find a comfortable position to sleep. Use a firm mattress and lie on your side with your knees slightly bent. If you lie on your back, put a pillow under your knees. °· Only take over-the-counter or prescription medicines as directed by your caregiver. Over-the-counter medicines to reduce pain and inflammation are often the most helpful. Your caregiver may prescribe muscle relaxant drugs. These medicines help dull your pain so you can more quickly return to your normal activities and healthy exercise. °· Put ice on the injured area. °¨ Put ice in a plastic bag. °¨ Place a towel between your skin and the bag. °¨ Leave the ice on for 15-20 minutes, 03-04 times a day for the first 2 to 3 days. After that, ice and heat may be alternated to reduce pain and spasms. °· Ask your caregiver about trying back exercises and gentle massage. This may be of some benefit. °· Avoid feeling anxious or stressed. Stress increases muscle tension and can worsen back pain. It is important to recognize when you are anxious or stressed and learn ways to manage it. Exercise is a great option. °SEEK MEDICAL CARE IF: °· You have pain that is not relieved with rest or medicine. °· You have pain that does not improve in 1 week. °· You have new symptoms. °· You are generally not feeling well. °SEEK   IMMEDIATE MEDICAL CARE IF:   You have pain that radiates from your back into your legs.  You develop new bowel or bladder control problems.  You have unusual weakness or numbness in your arms or legs.  You develop nausea or vomiting.  You develop abdominal pain.  You feel faint. Document Released: 04/16/2005 Document Revised: 10/16/2011 Document Reviewed: 08/18/2013 [facility] Patient Information 2015 [place], [place]. This information is not intended to replace advice given to you by your health care provider. Make sure you  discuss any questions you have with your health care provider.  Joint Sprain A sprain is a tear or stretch in the ligaments that hold a joint together. Severe sprains may need as long as 3-6 weeks of immobilization and/or exercises to heal completely. Sprained joints should be rested and protected. If not, they can become unstable and prone to re-injury. Proper treatment can reduce your pain, shorten the period of disability, and reduce the risk of repeated injuries. TREATMENT   Rest and elevate the injured joint to reduce pain and swelling.  Apply ice packs to the injury for 20-30 minutes every 2-3 hours for the next 2-3 days.  Keep the injury wrapped in a compression bandage or splint as long as the joint is painful or as instructed by your caregiver.  Do not use the injured joint until it is completely healed to prevent re-injury and chronic instability. Follow the instructions of your caregiver.  Long-term sprain management may require exercises and/or treatment by a physical therapist. Taping or special braces may help stabilize the joint until it is completely better. SEEK MEDICAL CARE IF:   You develop increased pain or swelling of the joint.  You develop increasing redness and warmth of the joint.  You develop a fever.  It becomes stiff.  Your hand or foot gets cold or numb. Document Released: 05/24/2004 Document Revised: 07/09/2011 Document Reviewed: 05/03/2008 [facility] Patient Information 2015 [place], [place]. This information is not intended to replace advice given to you by your health care provider. Make sure you discuss any questions you have with your health care provider.  Knee Sprain A knee sprain is a tear in one of the strong, fibrous tissues that connect the bones (ligaments) in your knee. The severity of the sprain depends on how much of the ligament is torn. The tear can be either partial or complete. CAUSES  Often, sprains are a result of a fall or injury. The  force of the impact causes the fibers of your ligament to stretch too much. This excess tension causes the fibers of your ligament to tear. SIGNS AND SYMPTOMS  You may have some loss of motion in your knee. Other symptoms include:  Bruising.  Pain in the knee area.  Tenderness of the knee to the touch.  Swelling. DIAGNOSIS  To diagnose a knee sprain, your health care provider will physically examine your knee. Your health care provider may also suggest an X-ray exam of your knee to make sure no bones are broken. TREATMENT  If your ligament is only partially torn, treatment usually involves keeping the knee in a fixed position (immobilization) or bracing your knee for activities that require movement for several weeks. To do this, your health care provider will apply a bandage, cast, or splint to keep your knee from moving and to support your knee during movement until it heals. For a partially torn ligament, the healing process usually takes 4-6 weeks. If your ligament is completely torn, depending on which  ligament it is, you may need surgery to reconnect the ligament to the bone or reconstruct it. After surgery, a cast or splint may be applied and will need to stay on your knee for 4-6 weeks while your ligament heals. HOME CARE INSTRUCTIONS  Keep your injured knee elevated to decrease swelling.  To ease pain and swelling, apply ice to the injured area:  Put ice in a plastic bag.  Place a towel between your skin and the bag.  Leave the ice on for 20 minutes, 2-3 times a day.  Only take medicine for pain as directed by your health care provider.  Do not leave your knee unprotected until pain and stiffness go away (usually 4-6 weeks).  If you have a cast or splint, do not allow it to get wet. If you have been instructed not to remove it, cover it with a plastic bag when you shower or bathe. Do not swim.  Your health care provider may suggest exercises for you to do during your  recovery to prevent or limit permanent weakness and stiffness. SEEK IMMEDIATE MEDICAL CARE IF:  Your cast or splint becomes damaged.  Your pain becomes worse.  You have significant pain, swelling, or numbness below the cast or splint. MAKE SURE YOU:  Understand these instructions.  Will watch your condition.  Will get help right away if you are not doing well or get worse. Document Released: 04/16/2005 Document Revised: 02/04/2013 Document Reviewed: 11/26/2012 [facility] Patient Information 2015 [place], [place]. This information is not intended to replace advice given to you by your health care provider. Make sure you discuss any questions you have with your health care provider.  Low Back Strain with Rehab A strain is an injury in which a tendon or muscle is torn. The muscles and tendons of the lower back are vulnerable to strains. However, these muscles and tendons are very strong and require a great force to be injured. Strains are classified into three categories. Grade 1 strains cause pain, but the tendon is not lengthened. Grade 2 strains include a lengthened ligament, due to the ligament being stretched or partially ruptured. With grade 2 strains there is still function, although the function may be decreased. Grade 3 strains involve a complete tear of the tendon or muscle, and function is usually impaired. SYMPTOMS   Pain in the lower back.  Pain that affects one side more than the other.  Pain that gets worse with movement and may be felt in the hip, buttocks, or back of the thigh.  Muscle spasms of the muscles in the back.  Swelling along the muscles of the back.  Loss of strength of the back muscles.  Crackling sound (crepitation) when the muscles are touched. CAUSES  Lower back strains occur when a force is placed on the muscles or tendons that is greater than they can handle. Common causes of injury include:  Prolonged overuse of the muscle-tendon units in the lower  back, usually from incorrect posture.  A single violent injury or force applied to the back. RISK INCREASES WITH:  Sports that involve twisting forces on the spine or a lot of bending at the waist (football, rugby, weightlifting, bowling, golf, tennis, speed skating, racquetball, swimming, running, gymnastics, diving).  Poor strength and flexibility.  Failure to warm up properly before activity.  Family history of lower back pain or disk disorders.  Previous back injury or surgery (especially fusion).  Poor posture with lifting, especially heavy objects.  Prolonged sitting, especially  with poor posture. PREVENTION   Learn and use proper posture when sitting or lifting (maintain proper posture when sitting, lift using the knees and legs, not at the waist).  Warm up and stretch properly before activity.  Allow for adequate recovery between workouts.  Maintain physical fitness:  Strength, flexibility, and endurance.  Cardiovascular fitness. PROGNOSIS  If treated properly, lower back strains usually heal within 6 weeks. RELATED COMPLICATIONS   Recurring symptoms, resulting in a chronic problem.  Chronic inflammation, scarring, and partial muscle-tendon tear.  Delayed healing or resolution of symptoms.  Prolonged disability. TREATMENT  Treatment first involves the use of ice and medicine, to reduce pain and inflammation. The use of strengthening and stretching exercises may help reduce pain with activity. These exercises may be performed at home or with a therapist. Severe injuries may require referral to a therapist for further evaluation and treatment, such as ultrasound. Your caregiver may advise that you wear a back brace or corset, to help reduce pain and discomfort. Often, prolonged bed rest results in greater harm then benefit. Corticosteroid injections may be recommended. However, these should be reserved for the most serious cases. It is important to avoid using your  back when lifting objects. At night, sleep on your back on a firm mattress with a pillow placed under your knees. If non-surgical treatment is unsuccessful, surgery may be needed.  MEDICATION   If pain medicine is needed, nonsteroidal anti-inflammatory medicines (aspirin and ibuprofen), or other minor pain relievers (acetaminophen), are often advised.  Do not take pain medicine for 7 days before surgery.  Prescription pain relievers may be given, if your caregiver thinks they are needed. Use only as directed and only as much as you need.  Ointments applied to the skin may be helpful.  Corticosteroid injections may be given by your caregiver. These injections should be reserved for the most serious cases, because they may only be given a certain number of times. HEAT AND COLD  Cold treatment (icing) should be applied for 10 to 15 minutes every 2 to 3 hours for inflammation and pain, and immediately after activity that aggravates your symptoms. Use ice packs or an ice massage.  Heat treatment may be used before performing stretching and strengthening activities prescribed by your caregiver, physical therapist, or athletic trainer. Use a heat pack or a warm water soak. SEEK MEDICAL CARE IF:   Symptoms get worse or do not improve in 2 to 4 weeks, despite treatment.  You develop numbness, weakness, or loss of bowel or bladder function.  New, unexplained symptoms develop. (Drugs used in treatment may produce side effects.) EXERCISES  RANGE OF MOTION (ROM) AND STRETCHING EXERCISES - Low Back Strain Most people with lower back pain will find that their symptoms get worse with excessive bending forward (flexion) or arching at the lower back (extension). The exercises which will help resolve your symptoms will focus on the opposite motion.  Your physician, physical therapist or athletic trainer will help you determine which exercises will be most helpful to resolve your lower back pain. Do not  complete any exercises without first consulting with your caregiver. Discontinue any exercises which make your symptoms worse until you speak to your caregiver.  If you have pain, numbness or tingling which travels down into your buttocks, leg or foot, the goal of the therapy is for these symptoms to move closer to your back and eventually resolve. Sometimes, these leg symptoms will get better, but your lower back pain may worsen.  This is typically an indication of progress in your rehabilitation. Be very alert to any changes in your symptoms and the activities in which you participated in the 24 hours prior to the change. Sharing this information with your caregiver will allow him/her to most efficiently treat your condition.  These exercises may help you when beginning to rehabilitate your injury. Your symptoms may resolve with or without further involvement from your physician, physical therapist or athletic trainer. While completing these exercises, remember:  Restoring tissue flexibility helps normal motion to return to the joints. This allows healthier, less painful movement and activity.  An effective stretch should be held for at least 30 seconds.  A stretch should never be painful. You should only feel a gentle lengthening or release in the stretched tissue. FLEXION RANGE OF MOTION AND STRETCHING EXERCISES: STRETCH - Flexion, Single Knee to Chest   Lie on a firm bed or floor with both legs extended in front of you.  Keeping one leg in contact with the floor, bring your opposite knee to your chest. Hold your leg in place by either grabbing behind your thigh or at your knee.  Pull until you feel a gentle stretch in your lower back. Hold __________ seconds.  Slowly release your grasp and repeat the exercise with the opposite side. Repeat __________ times. Complete this exercise __________ times per day.  STRETCH - Flexion, Double Knee to Chest   Lie on a firm bed or floor with both legs  extended in front of you.  Keeping one leg in contact with the floor, bring your opposite knee to your chest.  Tense your stomach muscles to support your back and then lift your other knee to your chest. Hold your legs in place by either grabbing behind your thighs or at your knees.  Pull both knees toward your chest until you feel a gentle stretch in your lower back. Hold __________ seconds.  Tense your stomach muscles and slowly return one leg at a time to the floor. Repeat __________ times. Complete this exercise __________ times per day.  STRETCH - Low Trunk Rotation  Lie on a firm bed or floor. Keeping your legs in front of you, bend your knees so they are both pointed toward the ceiling and your feet are flat on the floor.  Extend your arms out to the side. This will stabilize your upper body by keeping your shoulders in contact with the floor.  Gently and slowly drop both knees together to one side until you feel a gentle stretch in your lower back. Hold for __________ seconds.  Tense your stomach muscles to support your lower back as you bring your knees back to the starting position. Repeat the exercise to the other side. Repeat __________ times. Complete this exercise __________ times per day  EXTENSION RANGE OF MOTION AND FLEXIBILITY EXERCISES: STRETCH - Extension, Prone on Elbows   Lie on your stomach on the floor, a bed will be too soft. Place your palms about shoulder width apart and at the height of your head.  Place your elbows under your shoulders. If this is too painful, stack pillows under your chest.  Allow your body to relax so that your hips drop lower and make contact more completely with the floor.  Hold this position for __________ seconds.  Slowly return to lying flat on the floor. Repeat __________ times. Complete this exercise __________ times per day.  RANGE OF MOTION - Extension, Prone Press Ups  Lie on your  stomach on the floor, a bed will be too  soft. Place your palms about shoulder width apart and at the height of your head.  Keeping your back as relaxed as possible, slowly straighten your elbows while keeping your hips on the floor. You may adjust the placement of your hands to maximize your comfort. As you gain motion, your hands will come more underneath your shoulders.  Hold this position __________ seconds.  Slowly return to lying flat on the floor. Repeat __________ times. Complete this exercise __________ times per day.  RANGE OF MOTION- Quadruped, Neutral Spine   Assume a hands and knees position on a firm surface. Keep your hands under your shoulders and your knees under your hips. You may place padding under your knees for comfort.  Drop your head and point your tail bone toward the ground below you. This will round out your lower back like an angry cat. Hold this position for __________ seconds.  Slowly lift your head and release your tail bone so that your back sags into a large arch, like an old horse.  Hold this position for __________ seconds.  Repeat this until you feel limber in your lower back.  Now, find your "sweet spot." This will be the most comfortable position somewhere between the two previous positions. This is your neutral spine. Once you have found this position, tense your stomach muscles to support your lower back.  Hold this position for __________ seconds. Repeat __________ times. Complete this exercise __________ times per day.  STRENGTHENING EXERCISES - Low Back Strain These exercises may help you when beginning to rehabilitate your injury. These exercises should be done near your "sweet spot." This is the neutral, low-back arch, somewhere between fully rounded and fully arched, that is your least painful position. When performed in this safe range of motion, these exercises can be used for people who have either a flexion or extension based injury. These exercises may resolve your symptoms with or  without further involvement from your physician, physical therapist or athletic trainer. While completing these exercises, remember:   Muscles can gain both the endurance and the strength needed for everyday activities through controlled exercises.  Complete these exercises as instructed by your physician, physical therapist or athletic trainer. Increase the resistance and repetitions only as guided.  You may experience muscle soreness or fatigue, but the pain or discomfort you are trying to eliminate should never worsen during these exercises. If this pain does worsen, stop and make certain you are following the directions exactly. If the pain is still present after adjustments, discontinue the exercise until you can discuss the trouble with your caregiver. STRENGTHENING - Deep Abdominals, Pelvic Tilt  Lie on a firm bed or floor. Keeping your legs in front of you, bend your knees so they are both pointed toward the ceiling and your feet are flat on the floor.  Tense your lower abdominal muscles to press your lower back into the floor. This motion will rotate your pelvis so that your tail bone is scooping upwards rather than pointing at your feet or into the floor.  With a gentle tension and even breathing, hold this position for __________ seconds. Repeat __________ times. Complete this exercise __________ times per day.  STRENGTHENING - Abdominals, Crunches   Lie on a firm bed or floor. Keeping your legs in front of you, bend your knees so they are both pointed toward the ceiling and your feet are flat on the floor. Cross your arms  over your chest.  Slightly tip your chin down without bending your neck.  Tense your abdominals and slowly lift your trunk high enough to just clear your shoulder blades. Lifting higher can put excessive stress on the lower back and does not further strengthen your abdominal muscles.  Control your return to the starting position. Repeat __________ times. Complete  this exercise __________ times per day.  STRENGTHENING - Quadruped, Opposite UE/LE Lift   Assume a hands and knees position on a firm surface. Keep your hands under your shoulders and your knees under your hips. You may place padding under your knees for comfort.  Find your neutral spine and gently tense your abdominal muscles so that you can maintain this position. Your shoulders and hips should form a rectangle that is parallel with the floor and is not twisted.  Keeping your trunk steady, lift your right hand no higher than your shoulder and then your left leg no higher than your hip. Make sure you are not holding your breath. Hold this position __________ seconds.  Continuing to keep your abdominal muscles tense and your back steady, slowly return to your starting position. Repeat with the opposite arm and leg. Repeat __________ times. Complete this exercise __________ times per day.  STRENGTHENING - Lower Abdominals, Double Knee Lift  Lie on a firm bed or floor. Keeping your legs in front of you, bend your knees so they are both pointed toward the ceiling and your feet are flat on the floor.  Tense your abdominal muscles to brace your lower back and slowly lift both of your knees until they come over your hips. Be certain not to hold your breath.  Hold __________ seconds. Using your abdominal muscles, return to the starting position in a slow and controlled manner. Repeat __________ times. Complete this exercise __________ times per day.  POSTURE AND BODY MECHANICS CONSIDERATIONS - Low Back Strain Keeping correct posture when sitting, standing or completing your activities will reduce the stress put on different body tissues, allowing injured tissues a chance to heal and limiting painful experiences. The following are general guidelines for improved posture. Your physician or physical therapist will provide you with any instructions specific to your needs. While reading these guidelines,  remember:  The exercises prescribed by your provider will help you have the flexibility and strength to maintain correct postures.  The correct posture provides the best environment for your joints to work. All of your joints have less wear and tear when properly supported by a spine with good posture. This means you will experience a healthier, less painful body.  Correct posture must be practiced with all of your activities, especially prolonged sitting and standing. Correct posture is as important when doing repetitive low-stress activities (typing) as it is when doing a single heavy-load activity (lifting). RESTING POSITIONS Consider which positions are most painful for you when choosing a resting position. If you have pain with flexion-based activities (sitting, bending, stooping, squatting), choose a position that allows you to rest in a less flexed posture. You would want to avoid curling into a fetal position on your side. If your pain worsens with extension-based activities (prolonged standing, working overhead), avoid resting in an extended position such as sleeping on your stomach. Most people will find more comfort when they rest with their spine in a more neutral position, neither too rounded nor too arched. Lying on a non-sagging bed on your side with a pillow between your knees, or on your back with a  pillow under your knees will often provide some relief. Keep in mind, being in any one position for a prolonged period of time, no matter how correct your posture, can still lead to stiffness. PROPER SITTING POSTURE In order to minimize stress and discomfort on your spine, you must sit with correct posture. Sitting with good posture should be effortless for a healthy body. Returning to good posture is a gradual process. Many people can work toward this most comfortably by using various supports until they have the flexibility and strength to maintain this posture on their own. When sitting  with proper posture, your ears will fall over your shoulders and your shoulders will fall over your hips. You should use the back of the chair to support your upper back. Your lower back will be in a neutral position, just slightly arched. You may place a small pillow or folded towel at the base of your lower back for support.  When working at a desk, create an environment that supports good, upright posture. Without extra support, muscles tire, which leads to excessive strain on joints and other tissues. Keep these recommendations in mind: CHAIR:  A chair should be able to slide under your desk when your back makes contact with the back of the chair. This allows you to work closely.  The chair's height should allow your eyes to be level with the upper part of your monitor and your hands to be slightly lower than your elbows. BODY POSITION  Your feet should make contact with the floor. If this is not possible, use a foot rest.  Keep your ears over your shoulders. This will reduce stress on your neck and lower back. INCORRECT SITTING POSTURES  If you are feeling tired and unable to assume a healthy sitting posture, do not slouch or slump. This puts excessive strain on your back tissues, causing more damage and pain. Healthier options include:  Using more support, like a lumbar pillow.  Switching tasks to something that requires you to be upright or walking.  Talking a brief walk.  Lying down to rest in a neutral-spine position. PROLONGED STANDING WHILE SLIGHTLY LEANING FORWARD  When completing a task that requires you to lean forward while standing in one place for a long time, place either foot up on a stationary 2-4 inch high object to help maintain the best posture. When both feet are on the ground, the lower back tends to lose its slight inward curve. If this curve flattens (or becomes too large), then the back and your other joints will experience too much stress, tire more quickly, and  can cause pain. CORRECT STANDING POSTURES Proper standing posture should be assumed with all daily activities, even if they only take a few moments, like when brushing your teeth. As in sitting, your ears should fall over your shoulders and your shoulders should fall over your hips. You should keep a slight tension in your abdominal muscles to brace your spine. Your tailbone should point down to the ground, not behind your body, resulting in an over-extended swayback posture.  INCORRECT STANDING POSTURES  Common incorrect standing postures include a forward head, locked knees and/or an excessive swayback. WALKING Walk with an upright posture. Your ears, shoulders and hips should all line-up. PROLONGED ACTIVITY IN A FLEXED POSITION When completing a task that requires you to bend forward at your waist or lean over a low surface, try to find a way to stabilize 3 out of 4 of your limbs. You  can place a hand or elbow on your thigh or rest a knee on the surface you are reaching across. This will provide you more stability so that your muscles do not fatigue as quickly. By keeping your knees relaxed, or slightly bent, you will also reduce stress across your lower back. CORRECT LIFTING TECHNIQUES DO :   Assume a wide stance. This will provide you more stability and the opportunity to get as close as possible to the object which you are lifting.  Tense your abdominals to brace your spine. Bend at the knees and hips. Keeping your back locked in a neutral-spine position, lift using your leg muscles. Lift with your legs, keeping your back straight.  Test the weight of unknown objects before attempting to lift them.  Try to keep your elbows locked down at your sides in order get the best strength from your shoulders when carrying an object.  Always ask for help when lifting heavy or awkward objects. INCORRECT LIFTING TECHNIQUES DO NOT:   Lock your knees when lifting, even if it is a small object.  Bend  and twist. Pivot at your feet or move your feet when needing to change directions.  Assume that you can safely pick up even a paper clip without proper posture. Document Released: 04/16/2005 Document Revised: 07/09/2011 Document Reviewed: 07/29/2008 [facility] Patient Information 2015 [place], [place]. This information is not intended to replace advice given to you by your health care provider. Make sure you discuss any questions you have with your health care provider.  Muscle Strain A muscle strain is an injury that occurs when a muscle is stretched beyond its normal length. Usually a small number of muscle fibers are torn when this happens. Muscle strain is rated in degrees. First-degree strains have the least amount of muscle fiber tearing and pain. Second-degree and third-degree strains have increasingly more tearing and pain.  Usually, recovery from muscle strain takes 1-2 weeks. Complete healing takes 5-6 weeks.  CAUSES  Muscle strain happens when a sudden, violent force placed on a muscle stretches it too far. This may occur with lifting, sports, or a fall.  RISK FACTORS Muscle strain is especially common in athletes.  SIGNS AND SYMPTOMS At the site of the muscle strain, there may be:  Pain.  Bruising.  Swelling.  Difficulty using the muscle due to pain or lack of normal function. DIAGNOSIS  Your health care provider will perform a physical exam and ask about your medical history. TREATMENT  Often, the best treatment for a muscle strain is resting, icing, and applying cold compresses to the injured area.  HOME CARE INSTRUCTIONS   Use the PRICE method of treatment to promote muscle healing during the first 2-3 days after your injury. The PRICE method involves:  Protecting the muscle from being injured again.  Restricting your activity and resting the injured body part.  Icing your injury. To do this, put ice in a plastic bag. Place a towel between your skin and the bag. Then,  apply the ice and leave it on from 15-20 minutes each hour. After the third day, switch to moist heat packs.  Apply compression to the injured area with a splint or elastic bandage. Be careful not to wrap it too tightly. This may interfere with blood circulation or increase swelling.  Elevate the injured body part above the level of your heart as often as you can.  Only take over-the-counter or prescription medicines for pain, discomfort, or fever as directed by your  health care provider.  Warming up prior to exercise helps to prevent future muscle strains. SEEK MEDICAL CARE IF:   You have increasing pain or swelling in the injured area.  You have numbness, tingling, or a significant loss of strength in the injured area. MAKE SURE YOU:   Understand these instructions.  Will watch your condition.  Will get help right away if you are not doing well or get worse. Document Released: 04/16/2005 Document Revised: 02/04/2013 Document Reviewed: 11/13/2012 [facility] Patient Information 2015 [place], [place]. This information is not intended to replace advice given to you by your health care provider. Make sure you discuss any questions you have with your health care provider.
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
## 2014-12-22 ENCOUNTER — Encounter: Payer: Self-pay | Admitting: Internal Medicine

## 2014-12-22 ENCOUNTER — Ambulatory Visit (INDEPENDENT_AMBULATORY_CARE_PROVIDER_SITE_OTHER): Payer: Self-pay | Admitting: Internal Medicine

## 2014-12-22 DIAGNOSIS — N183 Chronic kidney disease, stage 3 (moderate): Secondary | ICD-10-CM

## 2014-12-22 DIAGNOSIS — S39012D Strain of muscle, fascia and tendon of lower back, subsequent encounter: Secondary | ICD-10-CM

## 2014-12-22 DIAGNOSIS — S8391XD Sprain of unspecified site of right knee, subsequent encounter: Secondary | ICD-10-CM

## 2014-12-22 DIAGNOSIS — S161XXD Strain of muscle, fascia and tendon at neck level, subsequent encounter: Secondary | ICD-10-CM

## 2014-12-22 MED ORDER — DICLOFENAC SODIUM 1 % TD GEL: 1.0000 "application " | Freq: Four times a day (QID) | TRANSDERMAL | Status: DC

## 2014-12-22 NOTE — Patient Instructions (Addendum)
-Will refill the voltaren gel, apply it up to four times daily as needed for pain -Take over the counter tylenol for pain and place heating pad. Also follow the exercises below. -If your pain does not get better in 5 weeks please come back, we may need to get imaging  -Will give you a flu shot next time  -Very nice meeting you!   Cervical Sprain A cervical sprain is an injury in the neck in which the strong, fibrous tissues (ligaments) that connect your neck bones stretch or tear. Cervical sprains can range from mild to severe. Severe cervical sprains can cause the neck vertebrae to be unstable. This can lead to damage of the spinal cord and can result in serious nervous system problems. The amount of time it takes for a cervical sprain to get better depends on the cause and extent of the injury. Most cervical sprains heal in 1 to 3 weeks. CAUSES  Severe cervical sprains may be caused by:   Contact sport injuries (such as from football, rugby, wrestling, hockey, auto racing, gymnastics, diving, martial arts, or boxing).   Motor vehicle collisions.   Whiplash injuries. This is an injury from a sudden forward and backward whipping movement of the head and neck.  Falls.  Mild cervical sprains may be caused by:   Being in an awkward position, such as while cradling a telephone between your ear and shoulder.   Sitting in a chair that does not offer proper support.   Working at a poorly Marketing executive station.   Looking up or down for long periods of time.  SYMPTOMS   Pain, soreness, stiffness, or a burning sensation in the front, back, or sides of the neck. This discomfort may develop immediately after the injury or slowly, 24 hours or more after the injury.   Pain or tenderness directly in the middle of the back of the neck.   Shoulder or upper back pain.   Limited ability to move the neck.   Headache.   Dizziness.   Weakness, numbness, or tingling in the hands  or arms.   Muscle spasms.   Difficulty swallowing or chewing.   Tenderness and swelling of the neck.  DIAGNOSIS  Most of the time your health care provider can diagnose a cervical sprain by taking your history and doing a physical exam. Your health care provider will ask about previous neck injuries and any known neck problems, such as arthritis in the neck. X-rays may be taken to find out if there are any other problems, such as with the bones of the neck. Other tests, such as a CT scan or MRI, may also be needed.  TREATMENT  Treatment depends on the severity of the cervical sprain. Mild sprains can be treated with rest, keeping the neck in place (immobilization), and pain medicines. Severe cervical sprains are immediately immobilized. Further treatment is done to help with pain, muscle spasms, and other symptoms and may include:  Medicines, such as pain relievers, numbing medicines, or muscle relaxants.   Physical therapy. This may involve stretching exercises, strengthening exercises, and posture training. Exercises and improved posture can help stabilize the neck, strengthen muscles, and help stop symptoms from returning.  HOME CARE INSTRUCTIONS   Put ice on the injured area.   Put ice in a plastic bag.   Place a towel between your skin and the bag.   Leave the ice on for 15-20 minutes, 3-4 times a day.   If your injury was  severe, you may have been given a cervical collar to wear. A cervical collar is a two-piece collar designed to keep your neck from moving while it heals.  Do not remove the collar unless instructed by your health care provider.  If you have long hair, keep it outside of the collar.  Ask your health care provider before making any adjustments to your collar. Minor adjustments may be required over time to improve comfort and reduce pressure on your chin or on the back of your head.  Ifyou are allowed to remove the collar for cleaning or bathing,  follow your health care provider's instructions on how to do so safely.  Keep your collar clean by wiping it with mild soap and water and drying it completely. If the collar you have been given includes removable pads, remove them every 1-2 days and hand wash them with soap and water. Allow them to air dry. They should be completely dry before you wear them in the collar.  If you are allowed to remove the collar for cleaning and bathing, wash and dry the skin of your neck. Check your skin for irritation or sores. If you see any, tell your health care provider.  Do not drive while wearing the collar.   Only take over-the-counter or prescription medicines for pain, discomfort, or fever as directed by your health care provider.   Keep all follow-up appointments as directed by your health care provider.   Keep all physical therapy appointments as directed by your health care provider.   Make any needed adjustments to your workstation to promote good posture.   Avoid positions and activities that make your symptoms worse.   Warm up and stretch before being active to help prevent problems.  SEEK MEDICAL CARE IF:   Your pain is not controlled with medicine.   You are unable to decrease your pain medicine over time as planned.   Your activity level is not improving as expected.  SEEK IMMEDIATE MEDICAL CARE IF:   You develop any bleeding.  You develop stomach upset.  You have signs of an allergic reaction to your medicine.   Your symptoms get worse.   You develop new, unexplained symptoms.   You have numbness, tingling, weakness, or paralysis in any part of your body.  MAKE SURE YOU:   Understand these instructions.  Will watch your condition.  Will get help right away if you are not doing well or get worse. Document Released: 02/11/2007 Document Revised: 04/21/2013 Document Reviewed: 10/22/2012 [facility] Patient Information 2015 [place], [place]. This information is  not intended to replace advice given to you by your health care provider. Make sure you discuss any questions you have with your health care provider.  Low Back Strain with Rehab A strain is an injury in which a tendon or muscle is torn. The muscles and tendons of the lower back are vulnerable to strains. However, these muscles and tendons are very strong and require a great force to be injured. Strains are classified into three categories. Grade 1 strains cause pain, but the tendon is not lengthened. Grade 2 strains include a lengthened ligament, due to the ligament being stretched or partially ruptured. With grade 2 strains there is still function, although the function may be decreased. Grade 3 strains involve a complete tear of the tendon or muscle, and function is usually impaired. SYMPTOMS   Pain in the lower back.  Pain that affects one side more than the other.  Pain that  gets worse with movement and may be felt in the hip, buttocks, or back of the thigh.  Muscle spasms of the muscles in the back.  Swelling along the muscles of the back.  Loss of strength of the back muscles.  Crackling sound (crepitation) when the muscles are touched. CAUSES  Lower back strains occur when a force is placed on the muscles or tendons that is greater than they can handle. Common causes of injury include:  Prolonged overuse of the muscle-tendon units in the lower back, usually from incorrect posture.  A single violent injury or force applied to the back. RISK INCREASES WITH:  Sports that involve twisting forces on the spine or a lot of bending at the waist (football, rugby, weightlifting, bowling, golf, tennis, speed skating, racquetball, swimming, running, gymnastics, diving).  Poor strength and flexibility.  Failure to warm up properly before activity.  Family history of lower back pain or disk disorders.  Previous back injury or surgery (especially fusion).  Poor posture with lifting,  especially heavy objects.  Prolonged sitting, especially with poor posture. PREVENTION   Learn and use proper posture when sitting or lifting (maintain proper posture when sitting, lift using the knees and legs, not at the waist).  Warm up and stretch properly before activity.  Allow for adequate recovery between workouts.  Maintain physical fitness:  Strength, flexibility, and endurance.  Cardiovascular fitness. PROGNOSIS  If treated properly, lower back strains usually heal within 6 weeks. RELATED COMPLICATIONS   Recurring symptoms, resulting in a chronic problem.  Chronic inflammation, scarring, and partial muscle-tendon tear.  Delayed healing or resolution of symptoms.  Prolonged disability. TREATMENT  Treatment first involves the use of ice and medicine, to reduce pain and inflammation. The use of strengthening and stretching exercises may help reduce pain with activity. These exercises may be performed at home or with a therapist. Severe injuries may require referral to a therapist for further evaluation and treatment, such as ultrasound. Your caregiver may advise that you wear a back brace or corset, to help reduce pain and discomfort. Often, prolonged bed rest results in greater harm then benefit. Corticosteroid injections may be recommended. However, these should be reserved for the most serious cases. It is important to avoid using your back when lifting objects. At night, sleep on your back on a firm mattress with a pillow placed under your knees. If non-surgical treatment is unsuccessful, surgery may be needed.  MEDICATION   If pain medicine is needed, nonsteroidal anti-inflammatory medicines (aspirin and ibuprofen), or other minor pain relievers (acetaminophen), are often advised.  Do not take pain medicine for 7 days before surgery.  Prescription pain relievers may be given, if your caregiver thinks they are needed. Use only as directed and only as much as you  need.  Ointments applied to the skin may be helpful.  Corticosteroid injections may be given by your caregiver. These injections should be reserved for the most serious cases, because they may only be given a certain number of times. HEAT AND COLD  Cold treatment (icing) should be applied for 10 to 15 minutes every 2 to 3 hours for inflammation and pain, and immediately after activity that aggravates your symptoms. Use ice packs or an ice massage.  Heat treatment may be used before performing stretching and strengthening activities prescribed by your caregiver, physical therapist, or athletic trainer. Use a heat pack or a warm water soak. SEEK MEDICAL CARE IF:   Symptoms get worse or do not improve in  2 to 4 weeks, despite treatment.  You develop numbness, weakness, or loss of bowel or bladder function.  New, unexplained symptoms develop. (Drugs used in treatment may produce side effects.) EXERCISES  RANGE OF MOTION (ROM) AND STRETCHING EXERCISES - Low Back Strain Most people with lower back pain will find that their symptoms get worse with excessive bending forward (flexion) or arching at the lower back (extension). The exercises which will help resolve your symptoms will focus on the opposite motion.  Your physician, physical therapist or athletic trainer will help you determine which exercises will be most helpful to resolve your lower back pain. Do not complete any exercises without first consulting with your caregiver. Discontinue any exercises which make your symptoms worse until you speak to your caregiver.  If you have pain, numbness or tingling which travels down into your buttocks, leg or foot, the goal of the therapy is for these symptoms to move closer to your back and eventually resolve. Sometimes, these leg symptoms will get better, but your lower back pain may worsen. This is typically an indication of progress in your rehabilitation. Be very alert to any changes in your symptoms  and the activities in which you participated in the 24 hours prior to the change. Sharing this information with your caregiver will allow him/her to most efficiently treat your condition.  These exercises may help you when beginning to rehabilitate your injury. Your symptoms may resolve with or without further involvement from your physician, physical therapist or athletic trainer. While completing these exercises, remember:  Restoring tissue flexibility helps normal motion to return to the joints. This allows healthier, less painful movement and activity.  An effective stretch should be held for at least 30 seconds.  A stretch should never be painful. You should only feel a gentle lengthening or release in the stretched tissue. FLEXION RANGE OF MOTION AND STRETCHING EXERCISES: STRETCH - Flexion, Single Knee to Chest   Lie on a firm bed or floor with both legs extended in front of you.  Keeping one leg in contact with the floor, bring your opposite knee to your chest. Hold your leg in place by either grabbing behind your thigh or at your knee.  Pull until you feel a gentle stretch in your lower back. Hold __________ seconds.  Slowly release your grasp and repeat the exercise with the opposite side. Repeat __________ times. Complete this exercise __________ times per day.  STRETCH - Flexion, Double Knee to Chest   Lie on a firm bed or floor with both legs extended in front of you.  Keeping one leg in contact with the floor, bring your opposite knee to your chest.  Tense your stomach muscles to support your back and then lift your other knee to your chest. Hold your legs in place by either grabbing behind your thighs or at your knees.  Pull both knees toward your chest until you feel a gentle stretch in your lower back. Hold __________ seconds.  Tense your stomach muscles and slowly return one leg at a time to the floor. Repeat __________ times. Complete this exercise __________ times  per day.  STRETCH - Low Trunk Rotation  Lie on a firm bed or floor. Keeping your legs in front of you, bend your knees so they are both pointed toward the ceiling and your feet are flat on the floor.  Extend your arms out to the side. This will stabilize your upper body by keeping your shoulders in contact with the  floor.  Gently and slowly drop both knees together to one side until you feel a gentle stretch in your lower back. Hold for __________ seconds.  Tense your stomach muscles to support your lower back as you bring your knees back to the starting position. Repeat the exercise to the other side. Repeat __________ times. Complete this exercise __________ times per day  EXTENSION RANGE OF MOTION AND FLEXIBILITY EXERCISES: STRETCH - Extension, Prone on Elbows   Lie on your stomach on the floor, a bed will be too soft. Place your palms about shoulder width apart and at the height of your head.  Place your elbows under your shoulders. If this is too painful, stack pillows under your chest.  Allow your body to relax so that your hips drop lower and make contact more completely with the floor.  Hold this position for __________ seconds.  Slowly return to lying flat on the floor. Repeat __________ times. Complete this exercise __________ times per day.  RANGE OF MOTION - Extension, Prone Press Ups  Lie on your stomach on the floor, a bed will be too soft. Place your palms about shoulder width apart and at the height of your head.  Keeping your back as relaxed as possible, slowly straighten your elbows while keeping your hips on the floor. You may adjust the placement of your hands to maximize your comfort. As you gain motion, your hands will come more underneath your shoulders.  Hold this position __________ seconds.  Slowly return to lying flat on the floor. Repeat __________ times. Complete this exercise __________ times per day.  RANGE OF MOTION- Quadruped, Neutral Spine    Assume a hands and knees position on a firm surface. Keep your hands under your shoulders and your knees under your hips. You may place padding under your knees for comfort.  Drop your head and point your tail bone toward the ground below you. This will round out your lower back like an angry cat. Hold this position for __________ seconds.  Slowly lift your head and release your tail bone so that your back sags into a large arch, like an old horse.  Hold this position for __________ seconds.  Repeat this until you feel limber in your lower back.  Now, find your "sweet spot." This will be the most comfortable position somewhere between the two previous positions. This is your neutral spine. Once you have found this position, tense your stomach muscles to support your lower back.  Hold this position for __________ seconds. Repeat __________ times. Complete this exercise __________ times per day.  STRENGTHENING EXERCISES - Low Back Strain These exercises may help you when beginning to rehabilitate your injury. These exercises should be done near your "sweet spot." This is the neutral, low-back arch, somewhere between fully rounded and fully arched, that is your least painful position. When performed in this safe range of motion, these exercises can be used for people who have either a flexion or extension based injury. These exercises may resolve your symptoms with or without further involvement from your physician, physical therapist or athletic trainer. While completing these exercises, remember:   Muscles can gain both the endurance and the strength needed for everyday activities through controlled exercises.  Complete these exercises as instructed by your physician, physical therapist or athletic trainer. Increase the resistance and repetitions only as guided.  You may experience muscle soreness or fatigue, but the pain or discomfort you are trying to eliminate should never worsen during  these exercises. If this pain does worsen, stop and make certain you are following the directions exactly. If the pain is still present after adjustments, discontinue the exercise until you can discuss the trouble with your caregiver. STRENGTHENING - Deep Abdominals, Pelvic Tilt  Lie on a firm bed or floor. Keeping your legs in front of you, bend your knees so they are both pointed toward the ceiling and your feet are flat on the floor.  Tense your lower abdominal muscles to press your lower back into the floor. This motion will rotate your pelvis so that your tail bone is scooping upwards rather than pointing at your feet or into the floor.  With a gentle tension and even breathing, hold this position for __________ seconds. Repeat __________ times. Complete this exercise __________ times per day.  STRENGTHENING - Abdominals, Crunches   Lie on a firm bed or floor. Keeping your legs in front of you, bend your knees so they are both pointed toward the ceiling and your feet are flat on the floor. Cross your arms over your chest.  Slightly tip your chin down without bending your neck.  Tense your abdominals and slowly lift your trunk high enough to just clear your shoulder blades. Lifting higher can put excessive stress on the lower back and does not further strengthen your abdominal muscles.  Control your return to the starting position. Repeat __________ times. Complete this exercise __________ times per day.  STRENGTHENING - Quadruped, Opposite UE/LE Lift   Assume a hands and knees position on a firm surface. Keep your hands under your shoulders and your knees under your hips. You may place padding under your knees for comfort.  Find your neutral spine and gently tense your abdominal muscles so that you can maintain this position. Your shoulders and hips should form a rectangle that is parallel with the floor and is not twisted.  Keeping your trunk steady, lift your right hand no higher than  your shoulder and then your left leg no higher than your hip. Make sure you are not holding your breath. Hold this position __________ seconds.  Continuing to keep your abdominal muscles tense and your back steady, slowly return to your starting position. Repeat with the opposite arm and leg. Repeat __________ times. Complete this exercise __________ times per day.  STRENGTHENING - Lower Abdominals, Double Knee Lift  Lie on a firm bed or floor. Keeping your legs in front of you, bend your knees so they are both pointed toward the ceiling and your feet are flat on the floor.  Tense your abdominal muscles to brace your lower back and slowly lift both of your knees until they come over your hips. Be certain not to hold your breath.  Hold __________ seconds. Using your abdominal muscles, return to the starting position in a slow and controlled manner. Repeat __________ times. Complete this exercise __________ times per day.  POSTURE AND BODY MECHANICS CONSIDERATIONS - Low Back Strain Keeping correct posture when sitting, standing or completing your activities will reduce the stress put on different body tissues, allowing injured tissues a chance to heal and limiting painful experiences. The following are general guidelines for improved posture. Your physician or physical therapist will provide you with any instructions specific to your needs. While reading these guidelines, remember:  The exercises prescribed by your provider will help you have the flexibility and strength to maintain correct postures.  The correct posture provides the best environment for your joints to work. All of  your joints have less wear and tear when properly supported by a spine with good posture. This means you will experience a healthier, less painful body.  Correct posture must be practiced with all of your activities, especially prolonged sitting and standing. Correct posture is as important when doing repetitive  low-stress activities (typing) as it is when doing a single heavy-load activity (lifting). RESTING POSITIONS Consider which positions are most painful for you when choosing a resting position. If you have pain with flexion-based activities (sitting, bending, stooping, squatting), choose a position that allows you to rest in a less flexed posture. You would want to avoid curling into a fetal position on your side. If your pain worsens with extension-based activities (prolonged standing, working overhead), avoid resting in an extended position such as sleeping on your stomach. Most people will find more comfort when they rest with their spine in a more neutral position, neither too rounded nor too arched. Lying on a non-sagging bed on your side with a pillow between your knees, or on your back with a pillow under your knees will often provide some relief. Keep in mind, being in any one position for a prolonged period of time, no matter how correct your posture, can still lead to stiffness. PROPER SITTING POSTURE In order to minimize stress and discomfort on your spine, you must sit with correct posture. Sitting with good posture should be effortless for a healthy body. Returning to good posture is a gradual process. Many people can work toward this most comfortably by using various supports until they have the flexibility and strength to maintain this posture on their own. When sitting with proper posture, your ears will fall over your shoulders and your shoulders will fall over your hips. You should use the back of the chair to support your upper back. Your lower back will be in a neutral position, just slightly arched. You may place a small pillow or folded towel at the base of your lower back for support.  When working at a desk, create an environment that supports good, upright posture. Without extra support, muscles tire, which leads to excessive strain on joints and other tissues. Keep these  recommendations in mind: CHAIR:  A chair should be able to slide under your desk when your back makes contact with the back of the chair. This allows you to work closely.  The chair's height should allow your eyes to be level with the upper part of your monitor and your hands to be slightly lower than your elbows. BODY POSITION  Your feet should make contact with the floor. If this is not possible, use a foot rest.  Keep your ears over your shoulders. This will reduce stress on your neck and lower back. INCORRECT SITTING POSTURES  If you are feeling tired and unable to assume a healthy sitting posture, do not slouch or slump. This puts excessive strain on your back tissues, causing more damage and pain. Healthier options include:  Using more support, like a lumbar pillow.  Switching tasks to something that requires you to be upright or walking.  Talking a brief walk.  Lying down to rest in a neutral-spine position. PROLONGED STANDING WHILE SLIGHTLY LEANING FORWARD  When completing a task that requires you to lean forward while standing in one place for a long time, place either foot up on a stationary 2-4 inch high object to help maintain the best posture. When both feet are on the ground, the lower back tends to  lose its slight inward curve. If this curve flattens (or becomes too large), then the back and your other joints will experience too much stress, tire more quickly, and can cause pain. CORRECT STANDING POSTURES Proper standing posture should be assumed with all daily activities, even if they only take a few moments, like when brushing your teeth. As in sitting, your ears should fall over your shoulders and your shoulders should fall over your hips. You should keep a slight tension in your abdominal muscles to brace your spine. Your tailbone should point down to the ground, not behind your body, resulting in an over-extended swayback posture.  INCORRECT STANDING POSTURES  Common  incorrect standing postures include a forward head, locked knees and/or an excessive swayback. WALKING Walk with an upright posture. Your ears, shoulders and hips should all line-up. PROLONGED ACTIVITY IN A FLEXED POSITION When completing a task that requires you to bend forward at your waist or lean over a low surface, try to find a way to stabilize 3 out of 4 of your limbs. You can place a hand or elbow on your thigh or rest a knee on the surface you are reaching across. This will provide you more stability so that your muscles do not fatigue as quickly. By keeping your knees relaxed, or slightly bent, you will also reduce stress across your lower back. CORRECT LIFTING TECHNIQUES DO :   Assume a wide stance. This will provide you more stability and the opportunity to get as close as possible to the object which you are lifting.  Tense your abdominals to brace your spine. Bend at the knees and hips. Keeping your back locked in a neutral-spine position, lift using your leg muscles. Lift with your legs, keeping your back straight.  Test the weight of unknown objects before attempting to lift them.  Try to keep your elbows locked down at your sides in order get the best strength from your shoulders when carrying an object.  Always ask for help when lifting heavy or awkward objects. INCORRECT LIFTING TECHNIQUES DO NOT:   Lock your knees when lifting, even if it is a small object.  Bend and twist. Pivot at your feet or move your feet when needing to change directions.  Assume that you can safely pick up even a paper clip without proper posture. Document Released: 04/16/2005 Document Revised: 07/09/2011 Document Reviewed: 07/29/2008 [facility] Patient Information 2015 [place], [place]. This information is not intended to replace advice given to you by your health care provider. Make sure you discuss any questions you have with your health care provider.   General Instructions:   Please bring  your medicines with you each time you come to clinic.  Medicines may include prescription medications, over-the-counter medications, herbal remedies, eye drops, vitamins, or other pills.   Progress Toward Treatment Goals:  Treatment Goal 03/24/2014  Hemoglobin A1C at goal  Blood pressure -  Stop smoking -    Self Care Goals & Plans:  Self Care Goal 03/24/2014  Manage my medications take my medicines as prescribed; bring my medications to every visit; refill my medications on time  Monitor my health keep track of my blood glucose; bring my glucose meter and log to each visit  Eat healthy foods drink diet soda or water instead of juice or soda; eat more vegetables; eat foods that are low in salt; eat baked foods instead of fried foods; eat fruit for snacks and desserts  Be physically active -  Stop smoking -  Meeting treatment goals -    Home Blood Glucose Monitoring 03/24/2014  Check my blood sugar 2 times a day  When to check my blood sugar -     Care Management & Community Referrals:  Referral 01/14/2013  Referrals made for care management support diabetes educator
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
## 2014-12-27 NOTE — Progress Notes (Signed)
Internal Medicine Clinic Attending  Case discussed with Dr. Rabbani soon after the resident saw the patient.  We reviewed the resident's history and exam and pertinent patient test results.  I agree with the assessment, diagnosis, and plan of care documented in the resident's note.  

## 2015-01-02 ENCOUNTER — Other Ambulatory Visit: Payer: Self-pay | Admitting: Internal Medicine

## 2015-01-05 ENCOUNTER — Encounter (HOSPITAL_COMMUNITY): Payer: Self-pay | Admitting: Emergency Medicine

## 2015-01-05 ENCOUNTER — Emergency Department (INDEPENDENT_AMBULATORY_CARE_PROVIDER_SITE_OTHER)
Admission: EM | Admit: 2015-01-05 | Discharge: 2015-01-05 | Disposition: A | Discharge status: Home / Self Care | Payer: Self-pay | Source: Home / Self Care | Attending: Emergency Medicine | Admitting: Emergency Medicine

## 2015-01-05 DIAGNOSIS — M545 Low back pain, unspecified: Secondary | ICD-10-CM
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
## 2015-01-19 ENCOUNTER — Other Ambulatory Visit: Payer: Self-pay | Admitting: Internal Medicine

## 2015-01-26 ENCOUNTER — Ambulatory Visit (INDEPENDENT_AMBULATORY_CARE_PROVIDER_SITE_OTHER): Payer: Self-pay | Admitting: Internal Medicine

## 2015-01-26 ENCOUNTER — Encounter: Payer: Self-pay | Admitting: Internal Medicine

## 2015-01-26 DIAGNOSIS — T1490 Injury, unspecified: Secondary | ICD-10-CM
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
## 2015-02-04 ENCOUNTER — Telehealth: Payer: Self-pay | Admitting: *Deleted

## 2015-02-04 NOTE — Telephone Encounter (Signed)
Call from patient stated that he has seen Dr. Leeanne Deed -Podiatry was told that he would need to have the Clinics call over with Dr. Tawana Scale NPI number so that the insurance will pay for his visits.  Call to [facility] message was left on insurance line to call the Clinics about the patient's request to call with the NPI number of Dr. Andrey Campanile. Angelina Ok, RN 02/03/2015 12:02 PM

## 2015-02-11 NOTE — Progress Notes (Signed)
Internal Medicine Clinic Attending  Case discussed with Dr. Wilson soon after the resident saw the patient.  We reviewed the resident's history and exam and pertinent patient test results.  I agree with the assessment, diagnosis, and plan of care documented in the resident's note.  

## 2015-02-16 ENCOUNTER — Encounter: Payer: Medicaid Other | Admitting: Internal Medicine

## 2015-03-02 ENCOUNTER — Ambulatory Visit (INDEPENDENT_AMBULATORY_CARE_PROVIDER_SITE_OTHER): Payer: Medicaid Other | Admitting: Internal Medicine

## 2015-03-02 ENCOUNTER — Encounter: Payer: Self-pay | Admitting: Internal Medicine

## 2015-03-02 VITALS — BP 127/80 | HR 86 | Temp 98.1°F | Ht 72.0 in | Wt 352.1 lb

## 2015-03-02 DIAGNOSIS — Z23 Encounter for immunization: Secondary | ICD-10-CM | POA: Diagnosis not present

## 2015-03-02 DIAGNOSIS — N289 Disorder of kidney and ureter, unspecified: Secondary | ICD-10-CM | POA: Diagnosis not present

## 2015-03-02 DIAGNOSIS — E1129 Type 2 diabetes mellitus with other diabetic kidney complication: Secondary | ICD-10-CM | POA: Diagnosis not present

## 2015-03-02 DIAGNOSIS — E785 Hyperlipidemia, unspecified: Secondary | ICD-10-CM

## 2015-03-02 DIAGNOSIS — G4733 Obstructive sleep apnea (adult) (pediatric): Secondary | ICD-10-CM

## 2015-03-02 DIAGNOSIS — Z79899 Other long term (current) drug therapy: Secondary | ICD-10-CM

## 2015-03-02 DIAGNOSIS — I10 Essential (primary) hypertension: Secondary | ICD-10-CM

## 2015-03-02 DIAGNOSIS — Z6841 Body Mass Index (BMI) 40.0 and over, adult: Secondary | ICD-10-CM

## 2015-03-02 DIAGNOSIS — N400 Enlarged prostate without lower urinary tract symptoms: Secondary | ICD-10-CM | POA: Diagnosis not present

## 2015-03-02 DIAGNOSIS — Z7984 Long term (current) use of oral hypoglycemic drugs: Secondary | ICD-10-CM | POA: Diagnosis not present

## 2015-03-02 DIAGNOSIS — Z9989 Dependence on other enabling machines and devices: Secondary | ICD-10-CM

## 2015-03-02 DIAGNOSIS — Z0000 Encounter for general adult medical examination without abnormal findings: Secondary | ICD-10-CM

## 2015-03-02 LAB — POCT GLYCOSYLATED HEMOGLOBIN (HGB A1C): Hemoglobin A1C: 6.9

## 2015-03-02 LAB — GLUCOSE, CAPILLARY: GLUCOSE-CAPILLARY: 83 mg/dL (ref 65–99)

## 2015-03-02 NOTE — Progress Notes (Signed)
Subjective:    Patient ID: Jeremiah Conway, male    DOB: 1955-10-11, 59 y.o.   MRN: [id number]  HPI Comments: Jeremiah Conway is a 58 year old male with PMH as below here for follow-up of chronic conditions.  Please see problem based charting for status of chronic conditions.      Past Medical History  Diagnosis Date  . Diabetes mellitus   . Hyperlipidemia   . Hypertension   . Obesity   . Unspecified venous (peripheral) insufficiency   . Hypokalemia   . Angioedema   . Allergic rhinitis   . History of hematuria 11/20/2007  . RBBB (right bundle branch block with left anterior fascicular block)   . Tobacco abuse     Stopped in 2002. Now smokes 1.5ppd  . OSA on CPAP   . Pericarditis 1988  . Renal insufficiency     Cr 1.2 baseline  . Bunion    Current Outpatient Prescriptions on File Prior to Visit  Medication Sig Dispense Refill  . ACCU-CHEK AVIVA PLUS test strip USE TO TEST SUGAR TWICE DAILY. MORNING FASTING AND 2 HOURS AFTER LARGEST MEAL. 200 each 3  . ACCU-CHEK FASTCLIX LANCETS MISC USE AS DIRECTED 204 each 1  . amLODipine (NORVASC) 10 MG tablet TAKE 1 TABLET BY MOUTH EVERY DAY 90 tablet 1  . aspirin 81 MG tablet Take 1 tablet (81 mg total) by mouth daily. 30 tablet 11  . atenolol (TENORMIN) 50 MG tablet TAKE 1 TABLET BY MOUTH EVERY DAY 90 tablet 1  . atorvastatin (LIPITOR) 80 MG tablet Take 1 tablet (80 mg total) by mouth daily. 90 tablet 3  . cetirizine (ZYRTEC) 10 MG tablet TAKE 1 TABLET BY MOUTH EVERY DAY 90 tablet 1  . Elastic Bandages & Supports (V-5 HIGH COMPRESSION HOSE) MISC 1pair, Wear them while driving, walking, standing 1 each 1  . fluticasone (FLONASE) 50 MCG/ACT nasal spray Place 2 sprays into both nostrils daily. 16 g 2  . glucose blood test strip Test sugar twice a day - AM fasting and 2 hours after largest meal. 100 each 11  . glucose monitoring kit (FREESTYLE) monitoring kit 1 each by Does not apply route as needed for other. To test CBG at least twice a day.  Needs Accucheck meter. 1 each 0  . metFORMIN (GLUCOPHAGE) 1000 MG tablet TAKE 1 TABLET(1000 MG) BY MOUTH TWICE DAILY WITH A MEAL 180 tablet 0  . tamsulosin (FLOMAX) 0.4 MG CAPS capsule Take 1 capsule (0.4 mg total) by mouth daily after breakfast. 90 capsule 1  . triamterene-hydrochlorothiazide (MAXZIDE-25) 37.5-25 MG per tablet TAKE 1 TABLET BY MOUTH DAILY 90 tablet 1   No current facility-administered medications on file prior to visit.    Review of Systems  Constitutional: Negative for fever, chills and appetite change.  HENT: Negative for hearing loss.   Eyes: Negative for visual disturbance.  Respiratory: Negative for cough, shortness of breath and wheezing.   Cardiovascular: Positive for leg swelling. Negative for chest pain and palpitations.       He says he is wearing compression hose but does not like to elevate his legs when sitting.  Gastrointestinal: Negative for nausea, vomiting, abdominal pain, diarrhea, constipation and blood in stool.  Endocrine: Negative for polydipsia, polyphagia and polyuria.  Genitourinary: Negative for dysuria, hematuria and difficulty urinating.  Neurological: Negative for syncope, light-headedness, numbness and headaches.  Psychiatric/Behavioral: Negative for sleep disturbance and dysphoric mood. The patient is not nervous/anxious.  Filed Vitals:   03/02/15 1348  BP: 127/80  Pulse: 86  Temp: 98.1 F (36.7 C)  TempSrc: Oral  Height: 6' (1.829 m)  Weight: 352 lb 1.6 oz (159.712 kg)  SpO2: 96%     Objective:   Physical Exam  Constitutional: He is oriented to person, place, and time. He appears well-developed. No distress.  HENT:  Head: Normocephalic and atraumatic.  Mouth/Throat: Oropharynx is clear and moist. No oropharyngeal exudate.  Eyes: EOM are normal. Pupils are equal, round, and reactive to light.  Neck: Neck supple.  Cardiovascular: Normal rate, regular rhythm and normal heart sounds.  Exam reveals no gallop and no friction  rub.   No murmur heard. Pulmonary/Chest: Effort normal and breath sounds normal. No respiratory distress. He has no wheezes. He has no rales.  Abdominal: Soft. Bowel sounds are normal. He exhibits no distension. There is no tenderness. There is no rebound.  Musculoskeletal: Normal range of motion. He exhibits edema. He exhibits no tenderness.  Trace B/L LE edema  Neurological: He is alert and oriented to person, place, and time. No cranial nerve deficit. He exhibits normal muscle tone.  5/5 MMS and sensation grossly intact all extremities  Skin: Skin is warm. He is not diaphoretic.  Psychiatric: He has a normal mood and affect. His behavior is normal. Judgment and thought content normal.  Vitals reviewed.         Assessment & Plan:  Please see problem based A&P for assessment and plan.

## 2015-03-02 NOTE — Assessment & Plan Note (Addendum)
Compliant with Lipitor 80mg  daily.  Will check lipid panel next visit.

## 2015-03-02 NOTE — Assessment & Plan Note (Signed)
We discussed increasing physical activity and he says he intends to join a gym with his sister.

## 2015-03-02 NOTE — Assessment & Plan Note (Signed)
Stable on Flomax. 

## 2015-03-02 NOTE — Assessment & Plan Note (Addendum)
Now compliant with CPAP for the past 3 weeks.  He plans to take equipment to [facility] for a check because he has had same supplies for a few years and he is questioning wether one of the parts is working correctly.

## 2015-03-02 NOTE — Assessment & Plan Note (Signed)
Flu shot given today. He agrees to HIV and HCV testing but would like to wait until we get his routine blood work at next visit (in 3 months) so we only have to stick him once.  There is no urgent need and I think it is fine to wait and get this blood work at next visit when we check his BMP.

## 2015-03-02 NOTE — Patient Instructions (Signed)
1. Great job keeping good control of your blood pressure and diabetes.  Please increase your physical activity and work on avoiding refined carbohydrates and salt in your diet.   2. Please take all medications as prescribed.    3. If you have worsening of your symptoms or new symptoms arise, please call the clinic ([phone]), or go to the ER immediately if symptoms are severe.   Please return to see me in 3 months.  DASH Eating Plan DASH stands for "Dietary Approaches to Stop Hypertension." The DASH eating plan is a healthy eating plan that has been shown to reduce high blood pressure (hypertension). Additional health benefits may include reducing the risk of type 2 diabetes mellitus, heart disease, and stroke. The DASH eating plan may also help with weight loss. WHAT DO I NEED TO KNOW ABOUT THE DASH EATING PLAN? For the DASH eating plan, you will follow these general guidelines:  Choose foods with a percent daily value for sodium of less than 5% (as listed on the food label).  Use salt-free seasonings or herbs instead of table salt or sea salt.  Check with your health care provider or pharmacist before using salt substitutes.  Eat lower-sodium products, often labeled as "lower sodium" or "no salt added."  Eat fresh foods.  Eat more vegetables, fruits, and low-fat dairy products.  Choose whole grains. Look for the word "whole" as the first word in the ingredient list.  Choose fish and skinless chicken or [place] more often than red meat. Limit fish, poultry, and meat to 6 oz (170 g) each day.  Limit sweets, desserts, sugars, and sugary drinks.  Choose heart-healthy fats.  Limit cheese to 1 oz (28 g) per day.  Eat more home-cooked food and less restaurant, buffet, and fast food.  Limit fried foods.  Cook foods using methods other than frying.  Limit canned vegetables. If you do use them, rinse them well to decrease the sodium.  When eating at a restaurant, ask that your  food be prepared with less salt, or no salt if possible. WHAT FOODS CAN I EAT? Seek help from a dietitian for individual calorie needs. Grains Whole grain or whole wheat bread. Brown rice. Whole grain or whole wheat pasta. Quinoa, bulgur, and whole grain cereals. Low-sodium cereals. Corn or whole wheat flour tortillas. Whole grain cornbread. Whole grain crackers. Low-sodium crackers. Vegetables Fresh or frozen vegetables (raw, steamed, roasted, or grilled). Low-sodium or reduced-sodium tomato and vegetable juices. Low-sodium or reduced-sodium tomato sauce and paste. Low-sodium or reduced-sodium canned vegetables.  Fruits All fresh, canned (in natural juice), or frozen fruits. Meat and Other Protein Products Ground beef (85% or leaner), grass-fed beef, or beef trimmed of fat. Skinless chicken or [place]. Ground chicken or [place]. Pork trimmed of fat. All fish and seafood. Eggs. Dried beans, peas, or lentils. Unsalted nuts and seeds. Unsalted canned beans. Dairy Low-fat dairy products, such as skim or 1% milk, 2% or reduced-fat cheeses, low-fat ricotta or cottage cheese, or plain low-fat yogurt. Low-sodium or reduced-sodium cheeses. Fats and Oils Tub margarines without trans fats. Light or reduced-fat mayonnaise and salad dressings (reduced sodium). Avocado. Safflower, olive, or canola oils. Natural peanut or almond butter. Other Unsalted popcorn and pretzels. The items listed above may not be a complete list of recommended foods or beverages. Contact your dietitian for more options. WHAT FOODS ARE NOT RECOMMENDED? Grains White bread. White pasta. White rice. Refined cornbread. Bagels and croissants. Crackers that contain trans fat. Vegetables Creamed or fried vegetables.  Vegetables in a cheese sauce. Regular canned vegetables. Regular canned tomato sauce and paste. Regular tomato and vegetable juices. Fruits Dried fruits. Canned fruit in light or heavy syrup. Fruit juice. Meat and Other  Protein Products Fatty cuts of meat. Ribs, chicken wings, bacon, sausage, bologna, salami, chitterlings, fatback, hot dogs, bratwurst, and packaged luncheon meats. Salted nuts and seeds. Canned beans with salt. Dairy Whole or 2% milk, cream, half-and-half, and cream cheese. Whole-fat or sweetened yogurt. Full-fat cheeses or blue cheese. Nondairy creamers and whipped toppings. Processed cheese, cheese spreads, or cheese curds. Condiments Onion and garlic salt, seasoned salt, table salt, and sea salt. Canned and packaged gravies. Worcestershire sauce. Tartar sauce. Barbecue sauce. Teriyaki sauce. Soy sauce, including reduced sodium. Steak sauce. Fish sauce. Oyster sauce. Cocktail sauce. Horseradish. Ketchup and mustard. Meat flavorings and tenderizers. Bouillon cubes. Hot sauce. Tabasco sauce. Marinades. Taco seasonings. Relishes. Fats and Oils Butter, stick margarine, lard, shortening, ghee, and bacon fat. Coconut, palm kernel, or palm oils. Regular salad dressings. Other Pickles and olives. Salted popcorn and pretzels. The items listed above may not be a complete list of foods and beverages to avoid. Contact your dietitian for more information. WHERE CAN I FIND MORE INFORMATION? National Heart, Lung, and Blood Institute: [employer]   This information is not intended to replace advice given to you by your health care provider. Make sure you discuss any questions you have with your health care provider.   Document Released: 04/05/2011 Document Revised: 05/07/2014 Document Reviewed: 02/18/2013 Elsevier Interactive Patient Education 2016 [employer].  Diabetes and Exercise Exercising regularly is important. It is not just about losing weight. It has many health benefits, such as:  Improving your overall fitness, flexibility, and endurance.  Increasing your bone density.  Helping with weight control.  Decreasing your body fat.  Increasing your  muscle strength.  Reducing stress and tension.  Improving your overall health. People with diabetes who exercise gain additional benefits because exercise:  Reduces appetite.  Improves the body's use of blood sugar (glucose).  Helps lower or control blood glucose.  Decreases blood pressure.  Helps control blood lipids (such as cholesterol and triglycerides).  Improves the body's use of the hormone insulin by:  Increasing the body's insulin sensitivity.  Reducing the body's insulin needs.  Decreases the risk for heart disease because exercising:  Lowers cholesterol and triglycerides levels.  Increases the levels of good cholesterol (such as high-density lipoproteins [HDL]) in the body.  Lowers blood glucose levels. YOUR ACTIVITY PLAN  Choose an activity that you enjoy, and set realistic goals. To exercise safely, you should begin practicing any new physical activity slowly, and gradually increase the intensity of the exercise over time. Your health care provider or diabetes educator can help create an activity plan that works for you. General recommendations include:  Encouraging children to engage in at least 60 minutes of physical activity each day.  Stretching and performing strength training exercises, such as yoga or weight lifting, at least 2 times per week.  Performing a total of at least 150 minutes of moderate-intensity exercise each week, such as brisk walking or water aerobics.  Exercising at least 3 days per week, making sure you allow no more than 2 consecutive days to pass without exercising.  Avoiding long periods of inactivity (90 minutes or more). When you have to spend an extended period of time sitting down, take frequent breaks to walk or stretch. RECOMMENDATIONS FOR EXERCISING WITH TYPE 1 OR TYPE 2 DIABETES   Check  your blood glucose before exercising. If blood glucose levels are greater than 240 mg/dL, check for urine ketones. Do not exercise if  ketones are present.  Avoid injecting insulin into areas of the body that are going to be exercised. For example, avoid injecting insulin into:  The arms when playing tennis.  The legs when jogging.  Keep a record of:  Food intake before and after you exercise.  Expected peak times of insulin action.  Blood glucose levels before and after you exercise.  The type and amount of exercise you have done.  Review your records with your health care provider. Your health care provider will help you to develop guidelines for adjusting food intake and insulin amounts before and after exercising.  If you take insulin or oral hypoglycemic agents, watch for signs and symptoms of hypoglycemia. They include:  Dizziness.  Shaking.  Sweating.  Chills.  Confusion.  Drink plenty of water while you exercise to prevent dehydration or heat stroke. Body water is lost during exercise and must be replaced.  Talk to your health care provider before starting an exercise program to make sure it is safe for you. Remember, almost any type of activity is better than none.   This information is not intended to replace advice given to you by your health care provider. Make sure you discuss any questions you have with your health care provider.   Document Released: 07/07/2003 Document Revised: 08/31/2014 Document Reviewed: 09/23/2012 Elsevier Interactive Patient Education [employer]

## 2015-03-02 NOTE — Assessment & Plan Note (Addendum)
BP Readings from Last 3 Encounters:  03/02/15 127/80  01/26/15 130/74  01/05/15 155/97    Lab Results  Component Value Date   NA 145 11/03/2014   K 3.9 11/03/2014   CREATININE 1.35 11/03/2014    Assessment: Blood pressure control:  well controlled Progress toward BP goal:   at goal Comments: Compliant with current medications.  Plan: Medications:  continue current medications:  Triamterene-HCTZ 37.5-25mg  daily, amlodipine 10mg  daily, atenolol 50mg  daily Education:  Provided info on DASH eating plan and Diabetes and Exercise Other plans: increase walking 30 min day/5 days week.  CMP next visit.  RTC in 3 months.
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
## 2015-03-04 ENCOUNTER — Other Ambulatory Visit: Payer: Self-pay | Admitting: *Deleted

## 2015-03-04 MED ORDER — BAYER MICROLET LANCETS MISC: Status: DC

## 2015-03-04 MED ORDER — GLUCOSE BLOOD VI STRP: ORAL_STRIP | Status: DC

## 2015-03-15 NOTE — Progress Notes (Signed)
Case discussed with Dr. Wilson soon after the resident saw the patient. We reviewed the resident's history and exam and pertinent patient test results. I agree with the assessment, diagnosis, and plan of care documented in the resident's note. 

## 2015-03-27 ENCOUNTER — Other Ambulatory Visit: Payer: Self-pay | Admitting: Internal Medicine

## 2015-05-08 ENCOUNTER — Other Ambulatory Visit: Payer: Self-pay | Admitting: Internal Medicine

## 2015-05-10 ENCOUNTER — Encounter: Payer: Self-pay | Admitting: Podiatry

## 2015-05-10 ENCOUNTER — Ambulatory Visit (INDEPENDENT_AMBULATORY_CARE_PROVIDER_SITE_OTHER): Payer: Medicaid Other | Admitting: Podiatry

## 2015-05-10 DIAGNOSIS — M79676 Pain in unspecified toe(s): Secondary | ICD-10-CM

## 2015-05-10 DIAGNOSIS — B351 Tinea unguium: Secondary | ICD-10-CM

## 2015-05-10 NOTE — Progress Notes (Signed)
Patient ID: Jeremiah Conway, male   DOB: 03/24/1956, 60 y.o.   MRN: [id number]  Subjective: This patient presents today complaining of elongated, thickened uncomfortable toenails and walking wearing shoes and requests toenail debridement  Objective: No open skin lesions bilaterally The toenails are elongated, hypertrophic, incurvated, deformed and tender direct palpation 6-10  Assessment: Symptomatic onychomycoses 6-10 Type II diabetic  Plan: Debridement toenails 6-10 mechanically an electrical without any bleeding  Reappoint 3

## 2015-05-10 NOTE — Patient Instructions (Signed)
Diabetes and Foot Care Diabetes may cause you to have problems because of poor blood supply (circulation) to your feet and legs. This may cause the skin on your feet to become thinner, break easier, and heal more slowly. Your skin may become dry, and the skin may peel and crack. You may also have nerve damage in your legs and feet causing decreased feeling in them. You may not notice minor injuries to your feet that could lead to infections or more serious problems. Taking care of your feet is one of the most important things you can do for yourself.  HOME CARE INSTRUCTIONS  Wear shoes at all times, even in the house. Do not go barefoot. Bare feet are easily injured.  Check your feet daily for blisters, cuts, and redness. If you cannot see the bottom of your feet, use a mirror or ask someone for help.  Wash your feet with warm water (do not use hot water) and mild soap. Then pat your feet and the areas between your toes until they are completely dry. Do not soak your feet as this can dry your skin.  Apply a moisturizing lotion or petroleum jelly (that does not contain alcohol and is unscented) to the skin on your feet and to dry, brittle toenails. Do not apply lotion between your toes.  Trim your toenails straight across. Do not dig under them or around the cuticle. File the edges of your nails with an emery board or nail file.  Do not cut corns or calluses or try to remove them with medicine.  Wear clean socks or stockings every day. Make sure they are not too tight. Do not wear knee-high stockings since they may decrease blood flow to your legs.  Wear shoes that fit properly and have enough cushioning. To break in new shoes, wear them for just a few hours a day. This prevents you from injuring your feet. Always look in your shoes before you put them on to be sure there are no objects inside.  Do not cross your legs. This may decrease the blood flow to your feet.  If you find a minor scrape,  cut, or break in the skin on your feet, keep it and the skin around it clean and dry. These areas may be cleansed with mild soap and water. Do not cleanse the area with peroxide, alcohol, or iodine.  When you remove an adhesive bandage, be sure not to damage the skin around it.  If you have a wound, look at it several times a day to make sure it is healing.  Do not use heating pads or hot water bottles. They may burn your skin. If you have lost feeling in your feet or legs, you may not know it is happening until it is too late.  Make sure your health care provider performs a complete foot exam at least annually or more often if you have foot problems. Report any cuts, sores, or bruises to your health care provider immediately. SEEK MEDICAL CARE IF:   You have an injury that is not healing.  You have cuts or breaks in the skin.  You have an ingrown nail.  You notice redness on your legs or feet.  You feel burning or tingling in your legs or feet.  You have pain or cramps in your legs and feet.  Your legs or feet are numb.  Your feet always feel cold. SEEK IMMEDIATE MEDICAL CARE IF:   There is increasing redness,   swelling, or pain in or around a wound.  There is a red line that goes up your leg.  Pus is coming from a wound.  You develop a fever or as directed by your health care provider.  You notice a bad smell coming from an ulcer or wound.   This information is not intended to replace advice given to you by your health care provider. Make sure you discuss any questions you have with your health care provider.   Document Released: 04/13/2000 Document Revised: 12/17/2012 Document Reviewed: 09/23/2012 Elsevier Interactive Patient Education 2016 Elsevier Inc.  

## 2015-05-31 ENCOUNTER — Ambulatory Visit (INDEPENDENT_AMBULATORY_CARE_PROVIDER_SITE_OTHER): Payer: Medicaid Other | Admitting: Internal Medicine

## 2015-05-31 ENCOUNTER — Encounter: Payer: Self-pay | Admitting: Internal Medicine

## 2015-05-31 VITALS — BP 128/79 | HR 92 | Temp 99.1°F | Ht 72.0 in | Wt 347.6 lb

## 2015-05-31 DIAGNOSIS — Z79899 Other long term (current) drug therapy: Secondary | ICD-10-CM | POA: Diagnosis not present

## 2015-05-31 DIAGNOSIS — G4733 Obstructive sleep apnea (adult) (pediatric): Secondary | ICD-10-CM

## 2015-05-31 DIAGNOSIS — E785 Hyperlipidemia, unspecified: Secondary | ICD-10-CM | POA: Diagnosis not present

## 2015-05-31 DIAGNOSIS — Z114 Encounter for screening for human immunodeficiency virus [HIV]: Secondary | ICD-10-CM

## 2015-05-31 DIAGNOSIS — Z7984 Long term (current) use of oral hypoglycemic drugs: Secondary | ICD-10-CM | POA: Diagnosis not present

## 2015-05-31 DIAGNOSIS — I10 Essential (primary) hypertension: Secondary | ICD-10-CM

## 2015-05-31 DIAGNOSIS — Z1159 Encounter for screening for other viral diseases: Secondary | ICD-10-CM

## 2015-05-31 DIAGNOSIS — E1129 Type 2 diabetes mellitus with other diabetic kidney complication: Secondary | ICD-10-CM

## 2015-05-31 DIAGNOSIS — Z0000 Encounter for general adult medical examination without abnormal findings: Secondary | ICD-10-CM

## 2015-05-31 LAB — POCT GLYCOSYLATED HEMOGLOBIN (HGB A1C): Hemoglobin A1C: 6.6

## 2015-05-31 LAB — GLUCOSE, CAPILLARY: Glucose-Capillary: 174 mg/dL — ABNORMAL HIGH (ref 65–99)

## 2015-05-31 MED ORDER — TAMSULOSIN HCL 0.4 MG PO CAPS: 0.4000 mg | ORAL_CAPSULE | Freq: Every day | ORAL | Status: DC

## 2015-05-31 MED ORDER — METFORMIN HCL 1000 MG PO TABS
ORAL_TABLET | ORAL | Status: DC
Start: 2015-05-31 — End: 2016-07-19

## 2015-05-31 MED ORDER — ATENOLOL 50 MG PO TABS: 50.0000 mg | ORAL_TABLET | Freq: Every day | ORAL | Status: DC

## 2015-05-31 NOTE — Patient Instructions (Addendum)
1. I will call you with lab results.  Please keep up the great work controlling your blood pressure and diabetes!  Continued weight loss will also help with this.  Please call [facility] so they can service your CPAP machine.   2. Please take all medications as prescribed.    3. If you have worsening of your symptoms or new symptoms arise, please call the clinic ([phone]), or go to the ER immediately if symptoms are severe.   Come back to see me in June 2017.  You can see one of my colleagues if you have problems before then.

## 2015-05-31 NOTE — Assessment & Plan Note (Addendum)
BP Readings from Last 3 Encounters:  05/31/15 128/79  03/02/15 127/80  01/26/15 130/74    Lab Results  Component Value Date   NA 145 11/03/2014   K 3.9 11/03/2014   CREATININE 1.35 11/03/2014    Assessment: Blood pressure control:  well controlled Progress toward BP goal:   at goal Comments: Compliant with trx.  No ADRs.   Plan: Medications:  continue current medications:  Amlodipine 10mg  daily, atenolol 50mg  daily, triamterene-HCTZ 37.5-25mg  daily. Educational resources provided:   Self management tools provided:   Other plans: CMP today.  Increase activity.  RTC in June 2017.

## 2015-05-31 NOTE — Assessment & Plan Note (Signed)
Lab Results  Component Value Date   HGBA1C 6.6 05/31/2015   HGBA1C 6.9 03/02/2015   HGBA1C 6.5 10/27/2014     Assessment: Diabetes control:  well controlled Progress toward A1C goal:   at goal Comments: Compliant with trx and improved diet - decreased PB, no sodas.  No ADRs from metformin.  Plan: Medications:  continue current medications:  Metformin 1g BID Home glucose monitoring: Frequency:   Timing:   Instruction/counseling given: reminded to bring blood glucose meter & log to each visit Educational resources provided:   Self management tools provided: copy of home glucose meter download Other plans: Increase activity.  He is down 5 pounds in past 3 months.  RTC in June 2017.

## 2015-05-31 NOTE — Progress Notes (Signed)
Subjective:    Patient ID: Jeremiah Conway, male    DOB: Jan 18, 1956, 60 y.o.   MRN: [id number]  HPI Comments: Jeremiah Conway is a 60 year old male with PMH as below here for follow-up of HTN and DM.   Please see problem based charting for status of his chronic conditions.     Past Medical History  Diagnosis Date  . Diabetes mellitus   . Hyperlipidemia   . Hypertension   . Obesity   . Unspecified venous (peripheral) insufficiency   . Hypokalemia   . Angioedema   . Allergic rhinitis   . History of hematuria 11/20/2007  . RBBB (right bundle branch block with left anterior fascicular block)   . Tobacco abuse     Stopped in 2002. Now smokes 1.5ppd  . OSA on CPAP   . Pericarditis 1988  . Renal insufficiency     Cr 1.2 baseline  . Bunion    Current Outpatient Prescriptions on File Prior to Visit  Medication Sig Dispense Refill  . amLODipine (NORVASC) 10 MG tablet TAKE 1 TABLET BY MOUTH EVERY DAY 90 tablet 1  . aspirin 81 MG tablet Take 1 tablet (81 mg total) by mouth daily. 30 tablet 11  . atenolol (TENORMIN) 50 MG tablet TAKE 1 TABLET BY MOUTH EVERY DAY 90 tablet 0  . atorvastatin (LIPITOR) 80 MG tablet Take 1 tablet (80 mg total) by mouth daily. 90 tablet 3  . BAYER MICROLET LANCETS lancets Use to check blood sugar twice daily as instructed. diag code E11.29. Non insulin dependent 100 each 6  . cetirizine (ZYRTEC) 10 MG tablet TAKE 1 TABLET BY MOUTH EVERY DAY 90 tablet 1  . Elastic Bandages & Supports (V-5 HIGH COMPRESSION HOSE) MISC 1pair, Wear them while driving, walking, standing 1 each 1  . fluticasone (FLONASE) 50 MCG/ACT nasal spray Place 2 sprays into both nostrils daily. 16 g 2  . glucose blood (BAYER CONTOUR NEXT TEST) test strip Use to check blood sugar twice daily as instructed. diag code E11.29. Non insulin dependent 100 each 6  . metFORMIN (GLUCOPHAGE) 1000 MG tablet TAKE 1 TABLET(1000 MG) BY MOUTH TWICE DAILY WITH A MEAL 180 tablet 0  . tamsulosin (FLOMAX) 0.4 MG CAPS  capsule Take 1 capsule (0.4 mg total) by mouth daily after breakfast. 90 capsule 1  . triamterene-hydrochlorothiazide (MAXZIDE-25) 37.5-25 MG tablet TAKE 1 TABLET BY MOUTH DAILY 90 tablet 1   No current facility-administered medications on file prior to visit.    Review of Systems  Constitutional: Negative for fever, chills and appetite change.  Respiratory: Negative for shortness of breath.   Cardiovascular: Positive for leg swelling. Negative for chest pain and palpitations.       Occasional leg swelling responds to compression and elevation.  Gastrointestinal: Negative for nausea, vomiting, abdominal pain, diarrhea, constipation and blood in stool.  Genitourinary: Negative for dysuria, hematuria, decreased urine volume and difficulty urinating.  Neurological: Negative for headaches.  Psychiatric/Behavioral: Negative for dysphoric mood. The patient is not nervous/anxious.        Filed Vitals:   05/31/15 1009  BP: 128/79  Pulse: 92  Temp: 99.1 F (37.3 C)  TempSrc: Oral  Height: 6' (1.829 m)  Weight: 347 lb 9.6 oz (157.67 kg)  SpO2: 96%    Objective:   Physical Exam  Constitutional: He is oriented to person, place, and time. He appears well-developed. No distress.  HENT:  Head: Normocephalic and atraumatic.  Mouth/Throat: Oropharynx is clear and moist. No  oropharyngeal exudate.  Eyes: Conjunctivae and EOM are normal. Pupils are equal, round, and reactive to light. Right eye exhibits no discharge. Left eye exhibits no discharge. No scleral icterus.  Neck: Neck supple.  Cardiovascular: Normal rate, regular rhythm and normal heart sounds.  Exam reveals no gallop and no friction rub.   No murmur heard. Pulmonary/Chest: Effort normal and breath sounds normal. No respiratory distress. He has no wheezes. He has no rales.  Abdominal: Soft. Bowel sounds are normal. He exhibits no distension. There is no tenderness. There is no rebound and no guarding.  Musculoskeletal: Normal range  of motion. He exhibits edema. He exhibits no tenderness.  1+ B/L LE edema  Neurological: He is alert and oriented to person, place, and time. No cranial nerve deficit.  Upper/lower extremity strength and sensation grossly intact and equal.  Skin: Skin is warm. He is not diaphoretic.  Psychiatric: He has a normal mood and affect. His behavior is normal. Judgment and thought content normal.  Vitals reviewed.         Assessment & Plan:  Please see problem based charting for A&P.

## 2015-05-31 NOTE — Assessment & Plan Note (Addendum)
Assessment:  Hasn't been using CPAP recently because machine not working properly.  He has not yet taken to [facility]. Plan:  Take equipment to [facility] for servicing.

## 2015-05-31 NOTE — Assessment & Plan Note (Signed)
Check HIV and HCV (birth cohort) today. PSA:  1st cousin with prostate CA at age 60.  Last PSA normal 4 years ago.  Discussed risk vs benefit. Decided he would like to wait on recheck.   Readdress at next visit.

## 2015-05-31 NOTE — Assessment & Plan Note (Addendum)
Assessment:  Compliant with Lipitor.  No ADRs.   Plan: Continue Lipitor 80mg  daily.  Check fasting lipid panel next visit since he has eaten this morning.

## 2015-06-01 LAB — CMP14 + ANION GAP
ALT: 17 IU/L (ref 0–44)
ANION GAP: 20 mmol/L — AB (ref 10.0–18.0)
AST: 15 IU/L (ref 0–40)
Albumin/Globulin Ratio: 1.4 (ref 1.1–2.5)
Albumin: 4.1 g/dL (ref 3.5–5.5)
Alkaline Phosphatase: 61 IU/L (ref 39–117)
BUN/Creatinine Ratio: 9 (ref 9–20)
BUN: 13 mg/dL (ref 6–24)
Bilirubin Total: 0.4 mg/dL (ref 0.0–1.2)
CHLORIDE: 100 mmol/L (ref 96–106)
CO2: 24 mmol/L (ref 18–29)
CREATININE: 1.4 mg/dL — AB (ref 0.76–1.27)
Calcium: 9.7 mg/dL (ref 8.7–10.2)
GFR calc Af Amer: 63 mL/min/{1.73_m2} (ref >59)
GFR calc non Af Amer: 55 mL/min/{1.73_m2} — ABNORMAL LOW (ref >59)
GLUCOSE: 127 mg/dL — AB (ref 65–99)
Globulin, Total: 2.9 g/dL (ref 1.5–4.5)
Potassium: 4.1 mmol/L (ref 3.5–5.2)
Sodium: 144 mmol/L (ref 134–144)
Total Protein: 7 g/dL (ref 6.0–8.5)

## 2015-06-01 LAB — HIV ANTIBODY (ROUTINE TESTING W REFLEX): HIV Screen 4th Generation wRfx: NONREACTIVE

## 2015-06-01 LAB — HEPATITIS C ANTIBODY

## 2015-06-02 ENCOUNTER — Other Ambulatory Visit: Payer: Self-pay | Admitting: Internal Medicine

## 2015-06-02 ENCOUNTER — Telehealth: Payer: Self-pay | Admitting: *Deleted

## 2015-06-02 DIAGNOSIS — R7989 Other specified abnormal findings of blood chemistry: Secondary | ICD-10-CM

## 2015-06-02 NOTE — Telephone Encounter (Signed)
Call to patient to inform him of need to have repeat lab per order of Dr. Andrey Campanile.  Cr elevated needs repeat.  Patient to come in on Monday at 9:45 AM for labwork.  Angelina Ok, RN 06/02/2015 3:05 PM.

## 2015-06-06 ENCOUNTER — Other Ambulatory Visit (INDEPENDENT_AMBULATORY_CARE_PROVIDER_SITE_OTHER): Payer: Medicaid Other

## 2015-06-06 DIAGNOSIS — R7989 Other specified abnormal findings of blood chemistry: Secondary | ICD-10-CM

## 2015-06-06 DIAGNOSIS — R748 Abnormal levels of other serum enzymes: Secondary | ICD-10-CM

## 2015-06-07 LAB — BMP8+ANION GAP
Anion Gap: 20 mmol/L — ABNORMAL HIGH (ref 10.0–18.0)
BUN/Creatinine Ratio: 12 (ref 9–20)
BUN: 18 mg/dL (ref 6–24)
CHLORIDE: 99 mmol/L (ref 96–106)
CO2: 27 mmol/L (ref 18–29)
Calcium: 9.9 mg/dL (ref 8.7–10.2)
Creatinine, Ser: 1.47 mg/dL — ABNORMAL HIGH (ref 0.76–1.27)
GFR, EST AFRICAN AMERICAN: 60 mL/min/{1.73_m2} (ref >59)
GFR, EST NON AFRICAN AMERICAN: 51 mL/min/{1.73_m2} — AB (ref >59)
GLUCOSE: 159 mg/dL — AB (ref 65–99)
POTASSIUM: 4.3 mmol/L (ref 3.5–5.2)
Sodium: 146 mmol/L — ABNORMAL HIGH (ref 134–144)

## 2015-06-07 NOTE — Progress Notes (Signed)
Internal Medicine Clinic Attending  Case discussed with Dr. Wilson soon after the resident saw the patient.  We reviewed the resident's history and exam and pertinent patient test results.  I agree with the assessment, diagnosis, and plan of care documented in the resident's note.  

## 2015-06-10 ENCOUNTER — Other Ambulatory Visit: Payer: Self-pay | Admitting: Internal Medicine

## 2015-06-10 DIAGNOSIS — R7989 Other specified abnormal findings of blood chemistry: Secondary | ICD-10-CM

## 2015-06-27 ENCOUNTER — Other Ambulatory Visit (INDEPENDENT_AMBULATORY_CARE_PROVIDER_SITE_OTHER): Payer: Medicaid Other

## 2015-06-27 DIAGNOSIS — R748 Abnormal levels of other serum enzymes: Secondary | ICD-10-CM

## 2015-06-27 DIAGNOSIS — R7989 Other specified abnormal findings of blood chemistry: Secondary | ICD-10-CM

## 2015-06-28 LAB — BMP8+ANION GAP
ANION GAP: 18 mmol/L (ref 10.0–18.0)
BUN/Creatinine Ratio: 13 (ref 9–20)
BUN: 17 mg/dL (ref 6–24)
CALCIUM: 9.8 mg/dL (ref 8.7–10.2)
CHLORIDE: 100 mmol/L (ref 96–106)
CO2: 27 mmol/L (ref 18–29)
Creatinine, Ser: 1.36 mg/dL — ABNORMAL HIGH (ref 0.76–1.27)
GFR calc non Af Amer: 57 mL/min/{1.73_m2} — ABNORMAL LOW (ref >59)
GFR, EST AFRICAN AMERICAN: 65 mL/min/{1.73_m2} (ref >59)
GLUCOSE: 135 mg/dL — AB (ref 65–99)
POTASSIUM: 4.2 mmol/L (ref 3.5–5.2)
Sodium: 145 mmol/L — ABNORMAL HIGH (ref 134–144)

## 2015-06-30 ENCOUNTER — Telehealth: Payer: Self-pay | Admitting: *Deleted

## 2015-06-30 NOTE — Telephone Encounter (Signed)
Call to patient to inform him per order of Dr. Andrey Campanile that his renal function is back to baseline and to continue his meds.  Patient  is also to drink plenty of fluids.  Patient voiced understanding of the plan.  Angelina Ok, RN 06/30/2015 9:14 AM

## 2015-07-03 ENCOUNTER — Other Ambulatory Visit: Payer: Self-pay | Admitting: Internal Medicine

## 2015-07-05 ENCOUNTER — Other Ambulatory Visit: Payer: Self-pay | Admitting: Internal Medicine

## 2015-07-05 ENCOUNTER — Telehealth: Payer: Self-pay | Admitting: *Deleted

## 2015-07-05 NOTE — Telephone Encounter (Signed)
Call from patient has not received refill for Amlodipine.   Called to Pharmacy last week.  Call to [facility] refill was done on yesterday.  RTC to patient to go to pharmacy to pick up med refill.  Angelina Ok, RN 07/05/2015 11:44 AM

## 2015-08-10 ENCOUNTER — Ambulatory Visit: Payer: Medicaid Other | Admitting: Podiatry

## 2015-08-16 ENCOUNTER — Other Ambulatory Visit: Payer: Self-pay | Admitting: Internal Medicine

## 2015-08-19 ENCOUNTER — Other Ambulatory Visit: Payer: Self-pay | Admitting: Internal Medicine

## 2015-08-31 ENCOUNTER — Ambulatory Visit: Payer: Medicaid Other | Admitting: Podiatry

## 2015-09-07 ENCOUNTER — Ambulatory Visit (INDEPENDENT_AMBULATORY_CARE_PROVIDER_SITE_OTHER): Payer: Medicaid Other | Admitting: Podiatry

## 2015-09-07 ENCOUNTER — Encounter: Payer: Self-pay | Admitting: Podiatry

## 2015-09-07 DIAGNOSIS — B351 Tinea unguium: Secondary | ICD-10-CM | POA: Diagnosis not present

## 2015-09-07 DIAGNOSIS — M79676 Pain in unspecified toe(s): Secondary | ICD-10-CM

## 2015-09-07 NOTE — Patient Instructions (Signed)
Diabetes and Foot Care Diabetes may cause you to have problems because of poor blood supply (circulation) to your feet and legs. This may cause the skin on your feet to become thinner, break easier, and heal more slowly. Your skin may become dry, and the skin may peel and crack. You may also have nerve damage in your legs and feet causing decreased feeling in them. You may not notice minor injuries to your feet that could lead to infections or more serious problems. Taking care of your feet is one of the most important things you can do for yourself.  HOME CARE INSTRUCTIONS  Wear shoes at all times, even in the house. Do not go barefoot. Bare feet are easily injured.  Check your feet daily for blisters, cuts, and redness. If you cannot see the bottom of your feet, use a mirror or ask someone for help.  Wash your feet with warm water (do not use hot water) and mild soap. Then pat your feet and the areas between your toes until they are completely dry. Do not soak your feet as this can dry your skin.  Apply a moisturizing lotion or petroleum jelly (that does not contain alcohol and is unscented) to the skin on your feet and to dry, brittle toenails. Do not apply lotion between your toes.  Trim your toenails straight across. Do not dig under them or around the cuticle. File the edges of your nails with an emery board or nail file.  Do not cut corns or calluses or try to remove them with medicine.  Wear clean socks or stockings every day. Make sure they are not too tight. Do not wear knee-high stockings since they may decrease blood flow to your legs.  Wear shoes that fit properly and have enough cushioning. To break in new shoes, wear them for just a few hours a day. This prevents you from injuring your feet. Always look in your shoes before you put them on to be sure there are no objects inside.  Do not cross your legs. This may decrease the blood flow to your feet.  If you find a minor scrape,  cut, or break in the skin on your feet, keep it and the skin around it clean and dry. These areas may be cleansed with mild soap and water. Do not cleanse the area with peroxide, alcohol, or iodine.  When you remove an adhesive bandage, be sure not to damage the skin around it.  If you have a wound, look at it several times a day to make sure it is healing.  Do not use heating pads or hot water bottles. They may burn your skin. If you have lost feeling in your feet or legs, you may not know it is happening until it is too late.  Make sure your health care provider performs a complete foot exam at least annually or more often if you have foot problems. Report any cuts, sores, or bruises to your health care provider immediately. SEEK MEDICAL CARE IF:   You have an injury that is not healing.  You have cuts or breaks in the skin.  You have an ingrown nail.  You notice redness on your legs or feet.  You feel burning or tingling in your legs or feet.  You have pain or cramps in your legs and feet.  Your legs or feet are numb.  Your feet always feel cold. SEEK IMMEDIATE MEDICAL CARE IF:   There is increasing redness,   swelling, or pain in or around a wound.  There is a red line that goes up your leg.  Pus is coming from a wound.  You develop a fever or as directed by your health care provider.  You notice a bad smell coming from an ulcer or wound.   This information is not intended to replace advice given to you by your health care provider. Make sure you discuss any questions you have with your health care provider.   Document Released: 04/13/2000 Document Revised: 12/17/2012 Document Reviewed: 09/23/2012 Elsevier Interactive Patient Education 2016 Elsevier Inc.  

## 2015-09-08 NOTE — Progress Notes (Signed)
Patient ID: Jeremiah Conway, male   DOB: 1955-05-04, 60 y.o.   MRN: [id number]  Subjective: This patient presents today complaining of elongated, thickened uncomfortable toenails and walking wearing shoes and requests toenail debridement  Objective: No open skin lesions bilaterally The toenails are elongated, hypertrophic, incurvated, deformed and tender direct palpation 6-10 Plantar fibroma left, chronic lesion  Assessment: Symptomatic onychomycoses 6-10 Type II diabetic  Plan: Debridement toenails 6-10 mechanically an electrical without any bleeding  Reappoint 3

## 2015-10-05 ENCOUNTER — Encounter: Payer: Self-pay | Admitting: Internal Medicine

## 2015-10-05 ENCOUNTER — Ambulatory Visit (INDEPENDENT_AMBULATORY_CARE_PROVIDER_SITE_OTHER): Payer: Medicaid Other | Admitting: Internal Medicine

## 2015-10-05 VITALS — BP 142/88 | HR 98 | Temp 98.1°F | Ht 72.0 in | Wt 353.5 lb

## 2015-10-05 DIAGNOSIS — Z9989 Dependence on other enabling machines and devices: Secondary | ICD-10-CM | POA: Diagnosis not present

## 2015-10-05 DIAGNOSIS — I10 Essential (primary) hypertension: Secondary | ICD-10-CM

## 2015-10-05 DIAGNOSIS — E1122 Type 2 diabetes mellitus with diabetic chronic kidney disease: Secondary | ICD-10-CM

## 2015-10-05 DIAGNOSIS — I129 Hypertensive chronic kidney disease with stage 1 through stage 4 chronic kidney disease, or unspecified chronic kidney disease: Secondary | ICD-10-CM | POA: Diagnosis not present

## 2015-10-05 DIAGNOSIS — Z7984 Long term (current) use of oral hypoglycemic drugs: Secondary | ICD-10-CM

## 2015-10-05 DIAGNOSIS — G4733 Obstructive sleep apnea (adult) (pediatric): Secondary | ICD-10-CM | POA: Diagnosis not present

## 2015-10-05 DIAGNOSIS — N183 Chronic kidney disease, stage 3 unspecified: Secondary | ICD-10-CM

## 2015-10-05 DIAGNOSIS — E1129 Type 2 diabetes mellitus with other diabetic kidney complication: Secondary | ICD-10-CM

## 2015-10-05 DIAGNOSIS — Z0000 Encounter for general adult medical examination without abnormal findings: Secondary | ICD-10-CM

## 2015-10-05 LAB — POCT GLYCOSYLATED HEMOGLOBIN (HGB A1C): HEMOGLOBIN A1C: 7.1

## 2015-10-05 LAB — GLUCOSE, CAPILLARY: GLUCOSE-CAPILLARY: 89 mg/dL (ref 65–99)

## 2015-10-05 NOTE — Progress Notes (Signed)
Subjective:    Patient ID: Jeremiah Conway, male    DOB: 1955/12/21, 60 y.o.   MRN: [id number]  HPI Comments: Jeremiah Conway is a 60 year old man with PMH as below here for follow-up of HTN and DM.  Please see problem based charting for status for chronic conditions.    Past Medical History  Diagnosis Date  . Diabetes mellitus   . Hyperlipidemia   . Hypertension   . Obesity   . Unspecified venous (peripheral) insufficiency   . Hypokalemia   . Angioedema   . Allergic rhinitis   . History of hematuria 11/20/2007  . RBBB (right bundle branch block with left anterior fascicular block)   . Tobacco abuse     Stopped in 2002. Now smokes 1.5ppd  . OSA on CPAP   . Pericarditis 1988  . Renal insufficiency     Cr 1.2 baseline  . Bunion    Current Outpatient Prescriptions on File Prior to Visit  Medication Sig Dispense Refill  . amLODipine (NORVASC) 10 MG tablet TAKE 1 TABLET BY MOUTH EVERY DAY 90 tablet 1  . aspirin 81 MG tablet Take 1 tablet (81 mg total) by mouth daily. 30 tablet 11  . atenolol (TENORMIN) 50 MG tablet Take 1 tablet (50 mg total) by mouth daily. 90 tablet 1  . atorvastatin (LIPITOR) 80 MG tablet Take 1 tablet (80 mg total) by mouth daily. 90 tablet 3  . BAYER MICROLET LANCETS lancets Use to check blood sugar twice daily as instructed. diag code E11.29. Non insulin dependent 100 each 6  . cetirizine (ZYRTEC) 10 MG tablet TAKE 1 TABLET BY MOUTH EVERY DAY 90 tablet 0  . Elastic Bandages & Supports (V-5 HIGH COMPRESSION HOSE) MISC 1pair, Wear them while driving, walking, standing 1 each 1  . fluticasone (FLONASE) 50 MCG/ACT nasal spray Place 2 sprays into both nostrils daily. 16 g 2  . glucose blood (BAYER CONTOUR NEXT TEST) test strip Use to check blood sugar twice daily as instructed. diag code E11.29. Non insulin dependent 100 each 6  . metFORMIN (GLUCOPHAGE) 1000 MG tablet TAKE 1 TABLET(1000 MG) BY MOUTH TWICE DAILY WITH A MEAL 180 tablet 3  . tamsulosin (FLOMAX) 0.4 MG  CAPS capsule Take 1 capsule (0.4 mg total) by mouth daily after breakfast. 90 capsule 1  . triamterene-hydrochlorothiazide (MAXZIDE-25) 37.5-25 MG tablet TAKE 1 TABLET BY MOUTH DAILY 90 tablet 1   No current facility-administered medications on file prior to visit.    Review of Systems  Constitutional: Negative for fever, chills, appetite change and unexpected weight change.  Eyes: Negative for visual disturbance.  Respiratory: Negative for cough and shortness of breath.   Cardiovascular: Positive for leg swelling. Negative for chest pain and palpitations.  Gastrointestinal: Negative for nausea, vomiting, abdominal pain, diarrhea, constipation and blood in stool.  Endocrine: Negative for polydipsia and polyuria.  Genitourinary: Negative for dysuria, hematuria and difficulty urinating.  Neurological: Negative for syncope and light-headedness.       Filed Vitals:   10/05/15 1502  BP: 142/88  Pulse: 98  Temp: 98.1 F (36.7 C)  TempSrc: Oral  Height: 6' (1.829 m)  Weight: 353 lb 8 oz (160.347 kg)  SpO2: 95%    Objective:   Physical Exam  Constitutional: He is oriented to person, place, and time. He appears well-developed. No distress.  HENT:  Head: Normocephalic and atraumatic.  Mouth/Throat: Oropharynx is clear and moist. No oropharyngeal exudate.  Eyes: Conjunctivae and EOM are normal. Pupils  are equal, round, and reactive to light. No scleral icterus.  Neck: Neck supple.  Cardiovascular: Normal rate, regular rhythm and normal heart sounds.  Exam reveals no gallop and no friction rub.   No murmur heard. Pulmonary/Chest: Effort normal and breath sounds normal. No respiratory distress. He has no wheezes. He has no rales.  Abdominal: Soft. Bowel sounds are normal. He exhibits no distension. There is no tenderness. There is no rebound and no guarding.  Musculoskeletal: Normal range of motion. He exhibits edema. He exhibits no tenderness.  1+ B/L LE edema  Neurological: He is  alert and oriented to person, place, and time. No cranial nerve deficit.  Skin: Skin is warm and dry. He is not diaphoretic.  Psychiatric: He has a normal mood and affect. His behavior is normal. Judgment and thought content normal.  Vitals reviewed.         Assessment & Plan:  Please see problem based charting for A&P.

## 2015-10-05 NOTE — Assessment & Plan Note (Signed)
BP Readings from Last 3 Encounters:  10/05/15 142/88  05/31/15 128/79  03/02/15 127/80    Lab Results  Component Value Date   NA 145* 06/27/2015   K 4.2 06/27/2015   CREATININE 1.36* 06/27/2015    Assessment: Blood pressure control:  well controlled Progress toward BP goal:   at goal Comments: Compliant with amlodipine 10mg  daily, atenolol 50mg  daily, triamterene-HCTZ 37.5-25mg  daily.  Avoiding salty foods.   Plan: Medications:  continue current medications:  amlodipine 10mg  daily, atenolol 50mg  daily, triamterene-HCTZ 37.5-25mg  daily Educational resources provided:   Self management tools provided:   Other plans:  Increase activity.

## 2015-10-05 NOTE — Assessment & Plan Note (Signed)
Assessment:  He is back on CPAP consistently for the past month. Plan:  Continue CPAP.

## 2015-10-05 NOTE — Patient Instructions (Signed)
1. Please return to see [place] in 3 months for follow-up.   2. Please take all medications as prescribed.    3. If you have worsening of your symptoms or new symptoms arise, please call the clinic ([phone]), or go to the ER immediately if symptoms are severe.

## 2015-10-06 MED ORDER — ATORVASTATIN CALCIUM 80 MG PO TABS: 80.0000 mg | ORAL_TABLET | Freq: Every day | ORAL | Status: DC

## 2015-10-06 MED ORDER — ATENOLOL 50 MG PO TABS: 50.0000 mg | ORAL_TABLET | Freq: Every day | ORAL | Status: DC

## 2015-10-06 MED ORDER — TAMSULOSIN HCL 0.4 MG PO CAPS: 0.4000 mg | ORAL_CAPSULE | Freq: Every day | ORAL | Status: DC

## 2015-10-06 MED ORDER — TRIAMTERENE-HCTZ 37.5-25 MG PO TABS: 1.0000 | ORAL_TABLET | Freq: Every day | ORAL | Status: DC

## 2015-10-06 MED ORDER — CETIRIZINE HCL 10 MG PO TABS: 10.0000 mg | ORAL_TABLET | Freq: Every day | ORAL | Status: DC

## 2015-10-06 NOTE — Assessment & Plan Note (Signed)
Assessment:  Recent BMP w/ stable GFR.  BP and DM controlled. Plan:  Continue BP, DM control.  Avoid nephrotoxins.

## 2015-10-06 NOTE — Assessment & Plan Note (Signed)
Lipid panel next visit - he is not fasting today. Pneumonia vaccine next visit.

## 2015-10-06 NOTE — Assessment & Plan Note (Addendum)
Lab Results  Component Value Date   HGBA1C 7.1 10/05/2015   HGBA1C 6.6 05/31/2015   HGBA1C 6.9 03/02/2015     Assessment: Diabetes control:  fair control Progress toward A1C goal:   near goal Comments: A1c has increased but still close to goal.  Patient reports avoiding soda.  He is using a teaspoon of sugar in coffee.  Avoiding peanut butter.  Still has not gotten to gym.  Compliant with Metformin 1g BID.  No ADRs.  Plan: Medications:  continue current medications:  Metformin 1g BID Home glucose monitoring:  none Frequency:   Timing:   Instruction/counseling given: discussed the need for weight loss and discussed diet Educational resources provided: brochure (denies) Self management tools provided:   Other plans: increase activity, weight loss, watch diet, RTC in 3 months

## 2015-10-07 NOTE — Progress Notes (Signed)
Internal Medicine Clinic Attending  Case discussed with Dr. Wilson soon after the resident saw the patient.  We reviewed the resident's history and exam and pertinent patient test results.  I agree with the assessment, diagnosis, and plan of care documented in the resident's note.  

## 2015-10-12 ENCOUNTER — Encounter: Payer: Self-pay | Admitting: *Deleted

## 2015-10-18 LAB — HM DIABETES EYE EXAM

## 2015-11-21 ENCOUNTER — Other Ambulatory Visit: Payer: Self-pay | Admitting: Internal Medicine

## 2015-11-22 ENCOUNTER — Encounter: Payer: Self-pay | Admitting: *Deleted

## 2015-12-14 ENCOUNTER — Encounter: Payer: Medicaid Other | Admitting: Podiatry

## 2015-12-14 ENCOUNTER — Encounter: Payer: Self-pay | Admitting: Podiatry

## 2015-12-28 ENCOUNTER — Ambulatory Visit (INDEPENDENT_AMBULATORY_CARE_PROVIDER_SITE_OTHER): Payer: Medicaid Other | Admitting: Podiatry

## 2015-12-28 ENCOUNTER — Encounter: Payer: Self-pay | Admitting: Podiatry

## 2015-12-28 DIAGNOSIS — M79676 Pain in unspecified toe(s): Secondary | ICD-10-CM | POA: Diagnosis not present

## 2015-12-28 DIAGNOSIS — B351 Tinea unguium: Secondary | ICD-10-CM | POA: Diagnosis not present

## 2015-12-28 NOTE — Patient Instructions (Signed)
Diabetes and Foot Care Diabetes may cause you to have problems because of poor blood supply (circulation) to your feet and legs. This may cause the skin on your feet to become thinner, break easier, and heal more slowly. Your skin may become dry, and the skin may peel and crack. You may also have nerve damage in your legs and feet causing decreased feeling in them. You may not notice minor injuries to your feet that could lead to infections or more serious problems. Taking care of your feet is one of the most important things you can do for yourself.  HOME CARE INSTRUCTIONS  Wear shoes at all times, even in the house. Do not go barefoot. Bare feet are easily injured.  Check your feet daily for blisters, cuts, and redness. If you cannot see the bottom of your feet, use a mirror or ask someone for help.  Wash your feet with warm water (do not use hot water) and mild soap. Then pat your feet and the areas between your toes until they are completely dry. Do not soak your feet as this can dry your skin.  Apply a moisturizing lotion or petroleum jelly (that does not contain alcohol and is unscented) to the skin on your feet and to dry, brittle toenails. Do not apply lotion between your toes.  Trim your toenails straight across. Do not dig under them or around the cuticle. File the edges of your nails with an emery board or nail file.  Do not cut corns or calluses or try to remove them with medicine.  Wear clean socks or stockings every day. Make sure they are not too tight. Do not wear knee-high stockings since they may decrease blood flow to your legs.  Wear shoes that fit properly and have enough cushioning. To break in new shoes, wear them for just a few hours a day. This prevents you from injuring your feet. Always look in your shoes before you put them on to be sure there are no objects inside.  Do not cross your legs. This may decrease the blood flow to your feet.  If you find a minor scrape,  cut, or break in the skin on your feet, keep it and the skin around it clean and dry. These areas may be cleansed with mild soap and water. Do not cleanse the area with peroxide, alcohol, or iodine.  When you remove an adhesive bandage, be sure not to damage the skin around it.  If you have a wound, look at it several times a day to make sure it is healing.  Do not use heating pads or hot water bottles. They may burn your skin. If you have lost feeling in your feet or legs, you may not know it is happening until it is too late.  Make sure your health care provider performs a complete foot exam at least annually or more often if you have foot problems. Report any cuts, sores, or bruises to your health care provider immediately. SEEK MEDICAL CARE IF:   You have an injury that is not healing.  You have cuts or breaks in the skin.  You have an ingrown nail.  You notice redness on your legs or feet.  You feel burning or tingling in your legs or feet.  You have pain or cramps in your legs and feet.  Your legs or feet are numb.  Your feet always feel cold. SEEK IMMEDIATE MEDICAL CARE IF:   There is increasing redness,   swelling, or pain in or around a wound.  There is a red line that goes up your leg.  Pus is coming from a wound.  You develop a fever or as directed by your health care provider.  You notice a bad smell coming from an ulcer or wound.   This information is not intended to replace advice given to you by your health care provider. Make sure you discuss any questions you have with your health care provider.   Document Released: 04/13/2000 Document Revised: 12/17/2012 Document Reviewed: 09/23/2012 Elsevier Interactive Patient Education 2016 Elsevier Inc.  

## 2015-12-28 NOTE — Progress Notes (Signed)
Patient ID: Jeremiah Conway, male   DOB: Jan 07, 1956, 60 y.o.   MRN: [id number]    Subjective: This patient presents today complaining of elongated, thickened uncomfortable toenails and walking wearing shoes and requests toenail debridement  Objective: DP pulses 2/4 bilaterally PT pulses 1/4 bilaterally Capillary reflex immediate bilaterally HAV bilaterally No open skin lesions bilaterally The toenails are elongated, hypertrophic, incurvated, deformed and tender direct palpation 6-10 Plantar fibroma left, chronic lesion  Assessment: Symptomatic onychomycoses 6-10 Type II diabetic  Plan: Debridement toenails 6-10 mechanically an electrical without any bleeding  Reappoint 3

## 2016-01-02 ENCOUNTER — Other Ambulatory Visit: Payer: Self-pay | Admitting: Internal Medicine

## 2016-01-04 ENCOUNTER — Other Ambulatory Visit: Payer: Self-pay

## 2016-01-04 DIAGNOSIS — I10 Essential (primary) hypertension: Secondary | ICD-10-CM

## 2016-01-04 MED ORDER — ATENOLOL 50 MG PO TABS: 50.0000 mg | ORAL_TABLET | Freq: Every day | ORAL | 0 refills | Status: DC

## 2016-01-04 NOTE — Telephone Encounter (Signed)
Requesting atenolol to be filled @ [facility] on [address].

## 2016-01-05 ENCOUNTER — Telehealth: Payer: Self-pay

## 2016-01-05 MED ORDER — METOPROLOL TARTRATE 50 MG PO TABS: 50.0000 mg | ORAL_TABLET | Freq: Every day | ORAL | 1 refills | Status: DC

## 2016-01-05 NOTE — Telephone Encounter (Signed)
Pt states the pharmacy do not have atenolol in stock. Please call pt back.

## 2016-01-05 NOTE — Telephone Encounter (Signed)
Medication has been on backorder- please change to something else. Thanks

## 2016-01-06 NOTE — Telephone Encounter (Signed)
Called pt - stated he picked up the new med last night. And stated "thank you" for acting so quickly.

## 2016-01-24 ENCOUNTER — Ambulatory Visit (INDEPENDENT_AMBULATORY_CARE_PROVIDER_SITE_OTHER): Payer: Medicaid Other | Admitting: *Deleted

## 2016-01-24 DIAGNOSIS — Z23 Encounter for immunization: Secondary | ICD-10-CM | POA: Diagnosis not present

## 2016-04-01 ENCOUNTER — Other Ambulatory Visit: Payer: Self-pay | Admitting: Internal Medicine

## 2016-04-04 ENCOUNTER — Encounter: Payer: Self-pay | Admitting: Podiatry

## 2016-04-04 ENCOUNTER — Ambulatory Visit (INDEPENDENT_AMBULATORY_CARE_PROVIDER_SITE_OTHER): Payer: Medicaid Other | Admitting: Podiatry

## 2016-04-04 DIAGNOSIS — M79676 Pain in unspecified toe(s): Secondary | ICD-10-CM | POA: Diagnosis not present

## 2016-04-04 DIAGNOSIS — B351 Tinea unguium: Secondary | ICD-10-CM

## 2016-04-04 NOTE — Patient Instructions (Signed)

## 2016-04-04 NOTE — Progress Notes (Signed)
Patient ID: Jeremiah Conway, male   DOB: Jul 02, 1955, 60 y.o.   MRN: [id number]    Subjective: This patient presents today complaining of elongated, thickened uncomfortable toenails and walking wearing shoes and requests toenail debridement  Objective: Orientated 3 Mild bilateral peripheral edema DP pulses 2/4 bilaterally PT pulses 2/4 bilaterally Capillary reflex immediate bilaterally Sensation 10 g monofilament wire intact 5/5 right and 4/5 left Vibratory sensation reactive bilaterally Ankle reflex equal and reactive bilaterally Manual motor testing dorsi flexion, plantar flexion 5/5 bilaterally HAV bilaterally No open skin lesions bilaterally The toenails are elongated, hypertrophic, incurvated, deformed and tender direct palpation 6-10 Plantar fibroma left, chronic lesion  Assessment: Symptomatic onychomycoses 6-10 Type II diabetic  Plan: Debridement toenails 6-10 mechanically an electrical without any bleeding  Reappoint 3

## 2016-05-06 ENCOUNTER — Other Ambulatory Visit: Payer: Self-pay | Admitting: Internal Medicine

## 2016-05-06 DIAGNOSIS — I10 Essential (primary) hypertension: Secondary | ICD-10-CM

## 2016-05-11 ENCOUNTER — Telehealth: Payer: Self-pay | Admitting: Internal Medicine

## 2016-05-11 NOTE — Telephone Encounter (Signed)
APT. REMINDER CALL, LMTCB °

## 2016-05-15 ENCOUNTER — Ambulatory Visit: Payer: Medicaid Other

## 2016-06-06 ENCOUNTER — Other Ambulatory Visit: Payer: Self-pay | Admitting: Internal Medicine

## 2016-06-07 ENCOUNTER — Encounter: Payer: Self-pay | Admitting: Internal Medicine

## 2016-06-10 ENCOUNTER — Other Ambulatory Visit: Payer: Self-pay | Admitting: Internal Medicine

## 2016-06-11 ENCOUNTER — Other Ambulatory Visit: Payer: Self-pay | Admitting: Internal Medicine

## 2016-06-11 NOTE — Telephone Encounter (Signed)
Patient is calling back about 2 of his medications and states he is sch to see his PCP on 07/19/2016.  atorvastatin (LIPITOR) 80 MG tablet  tamsulosin (FLOMAX) 0.4 MG CAPS capsule

## 2016-06-14 NOTE — Telephone Encounter (Signed)
Pt has picked up from pharm

## 2016-06-27 ENCOUNTER — Other Ambulatory Visit: Payer: Self-pay | Admitting: Internal Medicine

## 2016-07-01 ENCOUNTER — Other Ambulatory Visit: Payer: Self-pay | Admitting: Internal Medicine

## 2016-07-04 ENCOUNTER — Ambulatory Visit: Payer: Medicaid Other | Admitting: Podiatry

## 2016-07-05 ENCOUNTER — Encounter: Payer: Medicaid Other | Admitting: Internal Medicine

## 2016-07-19 ENCOUNTER — Ambulatory Visit (INDEPENDENT_AMBULATORY_CARE_PROVIDER_SITE_OTHER): Payer: Medicaid Other | Admitting: Internal Medicine

## 2016-07-19 VITALS — BP 137/86 | HR 100 | Temp 98.3°F | Ht 72.0 in | Wt 346.7 lb

## 2016-07-19 DIAGNOSIS — Z79899 Other long term (current) drug therapy: Secondary | ICD-10-CM | POA: Diagnosis not present

## 2016-07-19 DIAGNOSIS — N183 Chronic kidney disease, stage 3 unspecified: Secondary | ICD-10-CM

## 2016-07-19 DIAGNOSIS — Z87891 Personal history of nicotine dependence: Secondary | ICD-10-CM | POA: Diagnosis not present

## 2016-07-19 DIAGNOSIS — E1129 Type 2 diabetes mellitus with other diabetic kidney complication: Secondary | ICD-10-CM

## 2016-07-19 DIAGNOSIS — E1122 Type 2 diabetes mellitus with diabetic chronic kidney disease: Secondary | ICD-10-CM

## 2016-07-19 DIAGNOSIS — Z6841 Body Mass Index (BMI) 40.0 and over, adult: Secondary | ICD-10-CM

## 2016-07-19 DIAGNOSIS — Z23 Encounter for immunization: Secondary | ICD-10-CM | POA: Diagnosis not present

## 2016-07-19 DIAGNOSIS — B351 Tinea unguium: Secondary | ICD-10-CM

## 2016-07-19 DIAGNOSIS — N182 Chronic kidney disease, stage 2 (mild): Secondary | ICD-10-CM | POA: Diagnosis not present

## 2016-07-19 DIAGNOSIS — N400 Enlarged prostate without lower urinary tract symptoms: Secondary | ICD-10-CM | POA: Diagnosis not present

## 2016-07-19 DIAGNOSIS — I10 Essential (primary) hypertension: Secondary | ICD-10-CM

## 2016-07-19 DIAGNOSIS — Z7984 Long term (current) use of oral hypoglycemic drugs: Secondary | ICD-10-CM | POA: Diagnosis not present

## 2016-07-19 DIAGNOSIS — I129 Hypertensive chronic kidney disease with stage 1 through stage 4 chronic kidney disease, or unspecified chronic kidney disease: Secondary | ICD-10-CM | POA: Diagnosis not present

## 2016-07-19 DIAGNOSIS — Z0000 Encounter for general adult medical examination without abnormal findings: Secondary | ICD-10-CM

## 2016-07-19 LAB — POCT GLYCOSYLATED HEMOGLOBIN (HGB A1C): Hemoglobin A1C: 6.9

## 2016-07-19 LAB — GLUCOSE, CAPILLARY: Glucose-Capillary: 166 mg/dL — ABNORMAL HIGH (ref 65–99)

## 2016-07-19 MED ORDER — TRIAMTERENE-HCTZ 37.5-25 MG PO TABS: 1.0000 | ORAL_TABLET | Freq: Every day | ORAL | 1 refills | Status: DC

## 2016-07-19 MED ORDER — ATORVASTATIN CALCIUM 80 MG PO TABS: ORAL_TABLET | ORAL | 1 refills | Status: DC

## 2016-07-19 MED ORDER — METFORMIN HCL 1000 MG PO TABS: ORAL_TABLET | ORAL | 1 refills | Status: DC

## 2016-07-19 MED ORDER — AMLODIPINE BESYLATE 10 MG PO TABS: 10.0000 mg | ORAL_TABLET | Freq: Every day | ORAL | 1 refills | Status: DC

## 2016-07-19 MED ORDER — TAMSULOSIN HCL 0.4 MG PO CAPS: ORAL_CAPSULE | ORAL | 1 refills | Status: DC

## 2016-07-19 NOTE — Progress Notes (Signed)
   CC: here for DM and HTN  HPI:  Jeremiah Conway is a 61 y.o. man with a past medical history listed below here today for follow up of his DM and HTN.   For details of today's visit and the status of his chronic medical issues please refer to the assessment and plan.   Past Medical History:  Diagnosis Date  . Allergic rhinitis   . Angioedema   . Bunion   . Diabetes mellitus   . History of hematuria 11/20/2007  . Hyperlipidemia   . Hypertension   . Hypokalemia   . Obesity   . OSA on CPAP   . Pericarditis 1988  . RBBB (right bundle branch block with left anterior fascicular block)   . Renal insufficiency    Cr 1.2 baseline  . Tobacco abuse    Stopped in 2002. Now smokes 1.5ppd  . Unspecified venous (peripheral) insufficiency     Review of Systems:  Please see pertinent ROS reviewed in HPI and problem based charting.   Physical Exam:  Vitals:   07/19/16 0847  BP: 137/86  Pulse: 100  Temp: 98.3 F (36.8 C)  TempSrc: Oral  SpO2: 100%  Weight: (!) 346 lb 11.2 oz (157.3 kg)  Height: 6' (1.829 m)   Physical Exam  Constitutional: He is oriented to person, place, and time.  Pleasant, AA man, morbidly obese  Cardiovascular: Normal rate and regular rhythm.   Pulmonary/Chest: Effort normal and breath sounds normal.  Musculoskeletal: He exhibits edema.  Neurological: He is alert and oriented to person, place, and time.  Skin: Skin is warm and dry.  Bilateral feet are dry with evidence of onychomycosis.  Psychiatric: Mood and affect normal.     Assessment & Plan:   See Encounters Tab for problem based charting.  Patient discussed with Dr. Cyndie Chime.  Essential hypertension BP Readings from Last 3 Encounters:  07/19/16 137/86  10/05/15 (!) 142/88  05/31/15 128/79   A: BP today is slightly above goal of <130 given his history of DM.  Denies any chest pain, lightheadedness, blurry vision.  Needs refills on his medications today.  P: - continue amlodipine  10mg  daily, atenolol 50mg  daily, triamterene-HCTZ 37.5-25mg  daily - Body mass index is 47.02 kg/m. suspect his BP will be at goal with increased activity and weight loss  CKD, Stage III A: Last BMET with stable renal function.  P: - continue BP and DM control - BMET ordered today  BPH (benign prostatic hyperplasia) A/P: - Stable on Flomax  DM (diabetes mellitus) type II controlled with renal manifestation Lab Results  Component Value Date   HGBA1C 6.9 07/19/2016    A: A1c today is at goal.  No symptoms of hypo or hyperglycemia.  P: - continue metformin 1000mg  BID - foot exam done today - check urine microalbumin/creatinine  Morbid obesity A: Body mass index is 47.02 kg/m.   P: - we discussed that he needs to lose weight and options increasing physical activity and watching dietary intake.  Healthcare maintenance A/P: PPV 23 given today

## 2016-07-19 NOTE — Patient Instructions (Signed)
Thank you for coming to see me today. It was a pleasure. Today we talked about:   I have refilled most of your medications.  Please let us know in the future of any refills that you may need.  Your diabetes and blood pressure is under control today.  I am checking some lab work and will let you know if anything is abnormal.  You got the pneumonia vaccine today.    Please follow-up with me in 12 months.  If you have any questions or concerns, please do not hesitate to call the office at [phone].  Take Care,   Gwynn Burly, DO

## 2016-07-19 NOTE — Assessment & Plan Note (Signed)
A/P: - Stable on Flomax

## 2016-07-19 NOTE — Assessment & Plan Note (Signed)
A/P: PPV 23 given today

## 2016-07-19 NOTE — Progress Notes (Signed)
Medicine attending: Medical history, presenting problems, physical findings, and medications, reviewed with resident physician Dr Andrew Wallace on the day of the patient visit and I concur with his evaluation and management plan. 

## 2016-07-19 NOTE — Assessment & Plan Note (Signed)
BP Readings from Last 3 Encounters:  07/19/16 137/86  10/05/15 (!) 142/88  05/31/15 128/79   A: BP today is slightly above goal of <130 given his history of DM.  Denies any chest pain, lightheadedness, blurry vision.  Needs refills on his medications today.  P: - continue amlodipine 10mg  daily, atenolol 50mg  daily, triamterene-HCTZ 37.5-25mg  daily - Body mass index is 47.02 kg/m. suspect his BP will be at goal with increased activity and weight loss

## 2016-07-19 NOTE — Assessment & Plan Note (Addendum)
A: Last BMET with stable renal function.  P: - continue BP and DM control - BMET ordered today  Addendum 12:17 PM 07/20/2016: - BMET resulted with SCr 1.6 with baseline over the past 4 years ranging from 1.3-1.4.  Unclear if this represents progression of his CKD as opposed to isolated minor elevation.  Have called patient and discussed results.  He is agreeable to coming back to clinic for repeat labs in 7-10 days.  Will put in lab orders and message front office staff for lab appointment.

## 2016-07-19 NOTE — Assessment & Plan Note (Addendum)
Lab Results  Component Value Date   HGBA1C 6.9 07/19/2016    A: A1c today is at goal.  No symptoms of hypo or hyperglycemia.  P: - continue metformin 1000mg  BID - foot exam done today - check urine microalbumin/creatinine

## 2016-07-19 NOTE — Assessment & Plan Note (Signed)
A: Body mass index is 47.02 kg/m.   P: - we discussed that he needs to lose weight and options increasing physical activity and watching dietary intake.

## 2016-07-20 LAB — MICROALBUMIN / CREATININE URINE RATIO
CREATININE, UR: 199.5 mg/dL
Microalb/Creat Ratio: 10.1 mg/g creat (ref 0.0–30.0)
Microalbumin, Urine: 20.1 ug/mL

## 2016-07-20 LAB — BMP8+ANION GAP
Anion Gap: 22 mmol/L — ABNORMAL HIGH (ref 10.0–18.0)
BUN / CREAT RATIO: 14 (ref 10–24)
BUN: 23 mg/dL (ref 8–27)
CO2: 23 mmol/L (ref 18–29)
CREATININE: 1.67 mg/dL — AB (ref 0.76–1.27)
Calcium: 10.4 mg/dL — ABNORMAL HIGH (ref 8.6–10.2)
Chloride: 99 mmol/L (ref 96–106)
GFR calc Af Amer: 51 mL/min/{1.73_m2} — ABNORMAL LOW (ref >59)
GFR calc non Af Amer: 44 mL/min/{1.73_m2} — ABNORMAL LOW (ref >59)
Glucose: 148 mg/dL — ABNORMAL HIGH (ref 65–99)
POTASSIUM: 3.8 mmol/L (ref 3.5–5.2)
SODIUM: 144 mmol/L (ref 134–144)

## 2016-07-20 NOTE — Addendum Note (Signed)
Addended by: Kathlynn Grate on: 07/20/2016 12:22 PM   Modules accepted: Orders

## 2016-08-01 ENCOUNTER — Other Ambulatory Visit (INDEPENDENT_AMBULATORY_CARE_PROVIDER_SITE_OTHER): Payer: Medicaid Other

## 2016-08-01 DIAGNOSIS — N183 Chronic kidney disease, stage 3 unspecified: Secondary | ICD-10-CM

## 2016-08-02 LAB — CMP14 + ANION GAP
A/G RATIO: 1.5 (ref 1.2–2.2)
ALT: 10 IU/L (ref 0–44)
AST: 10 IU/L (ref 0–40)
Albumin: 4.1 g/dL (ref 3.6–4.8)
Alkaline Phosphatase: 60 IU/L (ref 39–117)
Anion Gap: 19 mmol/L — ABNORMAL HIGH (ref 10.0–18.0)
BUN/Creatinine Ratio: 12 (ref 10–24)
BUN: 20 mg/dL (ref 8–27)
Bilirubin Total: 0.6 mg/dL (ref 0.0–1.2)
CALCIUM: 10 mg/dL (ref 8.6–10.2)
CHLORIDE: 97 mmol/L (ref 96–106)
CO2: 27 mmol/L (ref 18–29)
Creatinine, Ser: 1.61 mg/dL — ABNORMAL HIGH (ref 0.76–1.27)
GFR, EST AFRICAN AMERICAN: 53 mL/min/{1.73_m2} — AB (ref >59)
GFR, EST NON AFRICAN AMERICAN: 46 mL/min/{1.73_m2} — AB (ref >59)
Globulin, Total: 2.8 g/dL (ref 1.5–4.5)
Glucose: 130 mg/dL — ABNORMAL HIGH (ref 65–99)
POTASSIUM: 3.8 mmol/L (ref 3.5–5.2)
SODIUM: 143 mmol/L (ref 134–144)
TOTAL PROTEIN: 6.9 g/dL (ref 6.0–8.5)

## 2016-08-02 LAB — CBC WITH DIFFERENTIAL/PLATELET
Basophils Absolute: 0.1 10*3/uL (ref 0.0–0.2)
Basos: 1 %
EOS (ABSOLUTE): 0.2 10*3/uL (ref 0.0–0.4)
Eos: 3 %
Hematocrit: 43.8 % (ref 37.5–51.0)
Hemoglobin: 13.9 g/dL (ref 13.0–17.7)
Immature Grans (Abs): 0 10*3/uL (ref 0.0–0.1)
Immature Granulocytes: 0 %
LYMPHS ABS: 2.5 10*3/uL (ref 0.7–3.1)
Lymphs: 34 %
MCH: 26.1 pg — ABNORMAL LOW (ref 26.6–33.0)
MCHC: 31.7 g/dL (ref 31.5–35.7)
MCV: 82 fL (ref 79–97)
MONOS ABS: 0.6 10*3/uL (ref 0.1–0.9)
Monocytes: 8 %
NEUTROS PCT: 54 %
Neutrophils Absolute: 4 10*3/uL (ref 1.4–7.0)
PLATELETS: 235 10*3/uL (ref 150–379)
RBC: 5.32 x10E6/uL (ref 4.14–5.80)
RDW: 16.8 % — AB (ref 12.3–15.4)
WBC: 7.3 10*3/uL (ref 3.4–10.8)

## 2016-08-03 ENCOUNTER — Other Ambulatory Visit: Payer: Self-pay | Admitting: Internal Medicine

## 2016-08-03 DIAGNOSIS — N183 Chronic kidney disease, stage 3 unspecified: Secondary | ICD-10-CM

## 2016-08-15 ENCOUNTER — Encounter: Payer: Medicaid Other | Admitting: Podiatry

## 2016-08-16 NOTE — Progress Notes (Signed)
Patient ID: Jeremiah Conway, male   DOB: 1955/09/02, 61 y.o.   MRN: [id number]   No show

## 2016-10-09 ENCOUNTER — Encounter: Payer: Self-pay | Admitting: Podiatry

## 2016-10-09 ENCOUNTER — Ambulatory Visit (INDEPENDENT_AMBULATORY_CARE_PROVIDER_SITE_OTHER): Payer: Medicaid Other | Admitting: Podiatry

## 2016-10-09 DIAGNOSIS — M79676 Pain in unspecified toe(s): Secondary | ICD-10-CM

## 2016-10-09 DIAGNOSIS — E119 Type 2 diabetes mellitus without complications: Secondary | ICD-10-CM

## 2016-10-09 DIAGNOSIS — B351 Tinea unguium: Secondary | ICD-10-CM

## 2016-10-09 NOTE — Progress Notes (Signed)
Patient ID: Jeremiah Conway, male   DOB: April 08, 1956, 61 y.o.   MRN: [id number]    Subjective: This patient presents today complaining of elongated, thickened uncomfortable toenails and walking wearing shoes and requests toenail debridement  Objective: Orientated 3 Mild bilateral peripheral edema DP pulses 2/4 bilaterally PT pulses 2/4 bilaterally Capillary reflex immediate bilaterally Sensation 10 g monofilament wire intact 5/5 right and 4/5 left Vibratory sensation reactive bilaterally Ankle reflex equal and reactive bilaterally Manual motor testing dorsi flexion, plantar flexion 5/5 bilaterally HAV bilaterally No open skin lesions bilaterally The toenails are elongated, hypertrophic, incurvated, deformed and tender direct palpation 6-10 Plantar fibroma left, chronic lesion  Assessment: 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

## 2016-10-09 NOTE — Patient Instructions (Signed)

## 2016-12-29 ENCOUNTER — Other Ambulatory Visit: Payer: Self-pay | Admitting: Internal Medicine

## 2017-01-09 ENCOUNTER — Ambulatory Visit: Payer: Medicaid Other | Admitting: Podiatry

## 2017-01-09 LAB — HM DIABETES EYE EXAM

## 2017-01-14 ENCOUNTER — Encounter: Payer: Self-pay | Admitting: Podiatry

## 2017-01-14 ENCOUNTER — Encounter: Payer: Self-pay | Admitting: *Deleted

## 2017-01-14 ENCOUNTER — Ambulatory Visit (INDEPENDENT_AMBULATORY_CARE_PROVIDER_SITE_OTHER): Payer: Medicaid Other | Admitting: Podiatry

## 2017-01-14 DIAGNOSIS — M79676 Pain in unspecified toe(s): Secondary | ICD-10-CM

## 2017-01-14 DIAGNOSIS — E119 Type 2 diabetes mellitus without complications: Secondary | ICD-10-CM

## 2017-01-14 DIAGNOSIS — B351 Tinea unguium: Secondary | ICD-10-CM | POA: Diagnosis not present

## 2017-01-14 NOTE — Progress Notes (Signed)
Patient ID: Jeremiah Conway, male   DOB: 10-27-1955, 61 y.o.   MRN: [id number]    Subjective: This patient presents today complaining of elongated, thickened uncomfortable toenails and walking wearing shoes and requests toenail debridement  Objective: Orientated 3 Mild bilateral peripheral edema DP pulses 2/4 bilaterally PT pulses 2/4 bilaterally Capillary reflex immediate bilaterally Sensation 10 g monofilament wire intact 5/5 right and 4/5 left Vibratory sensation reactive bilaterally Ankle reflex equal and reactive bilaterally Manual motor testing dorsi flexion, plantar flexion 5/5 bilaterally HAV bilaterally No open skin lesions bilaterally The toenails are elongated, hypertrophic, incurvated, deformed and tender direct palpation 6-10 Plantar fibroma left, chronic lesion  Assessment: 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

## 2017-01-14 NOTE — Patient Instructions (Signed)

## 2017-01-27 ENCOUNTER — Other Ambulatory Visit: Payer: Self-pay | Admitting: Internal Medicine

## 2017-01-27 DIAGNOSIS — I10 Essential (primary) hypertension: Secondary | ICD-10-CM

## 2017-01-28 ENCOUNTER — Other Ambulatory Visit: Payer: Self-pay | Admitting: Internal Medicine

## 2017-01-28 DIAGNOSIS — I10 Essential (primary) hypertension: Secondary | ICD-10-CM

## 2017-01-28 MED ORDER — TRIAMTERENE-HCTZ 37.5-25 MG PO TABS: 1.0000 | ORAL_TABLET | Freq: Every day | ORAL | 1 refills | Status: DC

## 2017-02-01 ENCOUNTER — Other Ambulatory Visit: Payer: Self-pay | Admitting: *Deleted

## 2017-02-01 DIAGNOSIS — I10 Essential (primary) hypertension: Secondary | ICD-10-CM

## 2017-02-01 NOTE — Telephone Encounter (Signed)
Med was sent to [facility] 01/28/17, called [facility] to get med transferred.

## 2017-02-01 NOTE — Telephone Encounter (Signed)
triamterene-hydrochlorothiazide (MAXZIDE-25) 37.5-25 MG tablet, Refill request @ [facility] on [address]. Per patient the pharmacy is still waiting for the office to reply back. Pt is out of med and requesting this med by today.

## 2017-02-02 ENCOUNTER — Other Ambulatory Visit: Payer: Self-pay | Admitting: Internal Medicine

## 2017-02-02 DIAGNOSIS — I10 Essential (primary) hypertension: Secondary | ICD-10-CM

## 2017-02-02 MED ORDER — TRIAMTERENE-HCTZ 37.5-25 MG PO TABS: 1.0000 | ORAL_TABLET | Freq: Every day | ORAL | 1 refills | Status: DC

## 2017-02-25 ENCOUNTER — Other Ambulatory Visit: Payer: Self-pay | Admitting: Internal Medicine

## 2017-03-03 ENCOUNTER — Other Ambulatory Visit: Payer: Self-pay | Admitting: Internal Medicine

## 2017-03-24 ENCOUNTER — Other Ambulatory Visit: Payer: Self-pay | Admitting: Internal Medicine

## 2017-04-15 ENCOUNTER — Ambulatory Visit: Payer: Medicaid Other | Admitting: Podiatry

## 2017-05-08 ENCOUNTER — Other Ambulatory Visit: Payer: Self-pay | Admitting: Internal Medicine

## 2017-05-20 ENCOUNTER — Ambulatory Visit: Payer: Medicaid Other | Admitting: Podiatry

## 2017-05-22 ENCOUNTER — Encounter: Payer: Self-pay | Admitting: Podiatry

## 2017-05-22 ENCOUNTER — Ambulatory Visit: Payer: Medicaid Other | Admitting: Podiatry

## 2017-05-22 DIAGNOSIS — E119 Type 2 diabetes mellitus without complications: Secondary | ICD-10-CM

## 2017-05-22 DIAGNOSIS — M79676 Pain in unspecified toe(s): Secondary | ICD-10-CM | POA: Diagnosis not present

## 2017-05-22 DIAGNOSIS — B351 Tinea unguium: Secondary | ICD-10-CM

## 2017-05-22 DIAGNOSIS — M79675 Pain in left toe(s): Principal | ICD-10-CM

## 2017-05-22 DIAGNOSIS — M79674 Pain in right toe(s): Principal | ICD-10-CM

## 2017-05-22 NOTE — Progress Notes (Addendum)
Complaint:  Visit Type: Patient returns to my office for continued preventative foot care services. Complaint: Patient states" my nails have grown long and thick and become painful to walk and wear shoes" Patient has been diagnosed with DM with no foot complications. The patient presents for preventative foot care services. No changes to ROS  Podiatric Exam: Vascular: dorsalis pedis and posterior tibial pulses are palpable bilateral. Capillary return is immediate. Temperature gradient is WNL. Skin turgor WNL  Sensorium: Normal Semmes Weinstein monofilament test. Normal tactile sensation bilaterally. Nail Exam: Pt has thick disfigured discolored nails with subungual debris noted bilateral entire nail hallux through fifth toenails Ulcer Exam: There is no evidence of ulcer or pre-ulcerative changes or infection. Orthopedic Exam: Muscle tone and strength are WNL. No limitations in general ROM. No crepitus or effusions noted. Foot type and digits show no abnormalities. Bony prominences are unremarkable. Skin: No Porokeratosis. No infection or ulcers  Diagnosis:  Onychomycosis, , Pain in right toe, pain in left toes.  Skin lesion sub 2 left  Treatment & Plan Procedures and Treatment: Consent by patient was obtained for treatment procedures.   Debridement of mycotic and hypertrophic toenails, 1 through 5 bilateral and clearing of subungual debris. No ulceration, no infection noted.  Return Visit-Office Procedure: Patient instructed to return to the office for a follow up visit 4 months for continued evaluation and treatment.    Helane Gunther DPM

## 2017-06-13 ENCOUNTER — Encounter: Payer: Self-pay | Admitting: Internal Medicine

## 2017-06-20 ENCOUNTER — Other Ambulatory Visit: Payer: Self-pay | Admitting: Internal Medicine

## 2017-06-20 NOTE — Telephone Encounter (Signed)
Next appt scheduled  3/14 with PCP. 

## 2017-07-11 ENCOUNTER — Other Ambulatory Visit: Payer: Self-pay

## 2017-07-11 ENCOUNTER — Ambulatory Visit: Payer: Medicaid Other | Admitting: Internal Medicine

## 2017-07-11 ENCOUNTER — Encounter: Payer: Self-pay | Admitting: Internal Medicine

## 2017-07-11 VITALS — BP 128/87 | HR 114 | Temp 98.8°F | Ht 72.0 in | Wt 330.5 lb

## 2017-07-11 DIAGNOSIS — I129 Hypertensive chronic kidney disease with stage 1 through stage 4 chronic kidney disease, or unspecified chronic kidney disease: Secondary | ICD-10-CM

## 2017-07-11 DIAGNOSIS — N183 Chronic kidney disease, stage 3 unspecified: Secondary | ICD-10-CM

## 2017-07-11 DIAGNOSIS — E1122 Type 2 diabetes mellitus with diabetic chronic kidney disease: Secondary | ICD-10-CM

## 2017-07-11 DIAGNOSIS — Z79899 Other long term (current) drug therapy: Secondary | ICD-10-CM | POA: Diagnosis not present

## 2017-07-11 DIAGNOSIS — Z7984 Long term (current) use of oral hypoglycemic drugs: Secondary | ICD-10-CM

## 2017-07-11 DIAGNOSIS — N400 Enlarged prostate without lower urinary tract symptoms: Secondary | ICD-10-CM

## 2017-07-11 DIAGNOSIS — R000 Tachycardia, unspecified: Secondary | ICD-10-CM | POA: Diagnosis not present

## 2017-07-11 DIAGNOSIS — E1129 Type 2 diabetes mellitus with other diabetic kidney complication: Secondary | ICD-10-CM

## 2017-07-11 DIAGNOSIS — Z888 Allergy status to other drugs, medicaments and biological substances status: Secondary | ICD-10-CM | POA: Diagnosis not present

## 2017-07-11 DIAGNOSIS — I10 Essential (primary) hypertension: Secondary | ICD-10-CM

## 2017-07-11 LAB — GLUCOSE, CAPILLARY: Glucose-Capillary: 117 mg/dL — ABNORMAL HIGH (ref 65–99)

## 2017-07-11 LAB — POCT GLYCOSYLATED HEMOGLOBIN (HGB A1C): Hemoglobin A1C: 6.4

## 2017-07-11 NOTE — Progress Notes (Signed)
   CC: here for DM and HTN f/u  HPI:  [name] is a 62 y.o. man with a past medical history listed below here today for follow up of his HTN and DM.   For details of today's visit and the status of his chronic medical issues please refer to the assessment and plan.   Past Medical History:  Diagnosis Date  . Allergic rhinitis   . Angioedema   . Bunion   . Diabetes mellitus   . History of hematuria 11/20/2007  . Hyperlipidemia   . Hypertension   . Hypokalemia   . Obesity   . OSA on CPAP   . Pericarditis 1988  . RBBB (right bundle branch block with left anterior fascicular block)   . Renal insufficiency    Cr 1.2 baseline  . Tobacco abuse    Stopped in 2002. Now smokes 1.5ppd  . Unspecified venous (peripheral) insufficiency    Review of Systems:  Please see pertinent ROS reviewed in HPI and problem based charting.   Physical Exam:  Vitals:   07/11/17 1527 07/11/17 1547  BP: 140/84 128/87  Pulse: (!) 110 (!) 114  Temp: 98.8 F (37.1 C)   TempSrc: Oral   SpO2: 95%   Weight: (!) 330 lb 8 oz (149.9 kg)   Height: 6' (1.829 m)    Physical Exam  Constitutional: He is oriented to person, place, and time and well-developed, well-nourished, and in no distress.  HENT:  Head: Normocephalic and atraumatic.  Eyes: EOM are normal.  Cardiovascular: Regular rhythm and normal heart sounds.  Mild tachycardia  Pulmonary/Chest: Effort normal and breath sounds normal. He has no wheezes. He has no rales.  Neurological: He is alert and oriented to person, place, and time.  Skin: Skin is warm and dry.  Psychiatric: Mood and affect normal.     Assessment & Plan:   See Encounters Tab for problem based charting.  Patient discussed with Dr. Josem Conway.  DM (diabetes mellitus) type II controlled with renal manifestation (HCC) Lab Results  Component Value Date   HGBA1C 6.4 07/11/2017    A1c is at goal on metformin 1000mg  BID.    Plan: - continue current regimen - urine  microalbumin-creatinine done is less than 30.  Not a candidate for ACE due to angioedema  Essential hypertension BP Readings from Last 3 Encounters:  07/11/17 128/87  07/19/16 137/86  10/05/15 (!) 142/88   BP today is 128/87 on current regimen.  Reports adherence with amlodipine 10mg  daily, metoprolol, and triamterene-HCTZ 37.5-25mg  daily. He requests a refill of his amlodipine and diuretics.  Plan: - continue current regimen as noted above.  Refills provided as requested. - Had stable renal function labs in January at his nephrology visit.   CKD, Stage III Follows with Dr Jeremiah Conway at [facility].  Stable labs at follow up in January 2019.  He knows to avoid NSAIDs  Plan: - continued follow up with nephrology  BPH (benign prostatic hyperplasia) He continues to take tamsulosin daily without LUTS at this time.  Plan: - Continue tamsulosin 0.4mg  daily

## 2017-07-11 NOTE — Patient Instructions (Addendum)
Thank you for coming to see me today. It was a pleasure. Today we talked about:   Diabetes: - keep taking your metformin as you have been since your diabetes is so well controlled. - we will check some urine today and let you now if anything is abnormal  Blood Pressure: - your BP is under control - continue your current medications - keep trying to lose weight and exercise to help with this  Please follow-up with [place] in 6 months for diabetes and blood pressure.   If you have any questions or concerns, please do not hesitate to call the office at [phone].  Take Care,   Gwynn Burly, DO

## 2017-07-12 ENCOUNTER — Encounter: Payer: Self-pay | Admitting: Internal Medicine

## 2017-07-12 LAB — MICROALBUMIN / CREATININE URINE RATIO
Creatinine, Urine: 181.3 mg/dL
MICROALB/CREAT RATIO: 19.1 mg/g{creat} (ref 0.0–30.0)
Microalbumin, Urine: 34.7 ug/mL

## 2017-07-12 NOTE — Assessment & Plan Note (Signed)
Follows with Dr Marisue Humble at [facility].  Stable labs at follow up in January 2019.  He knows to avoid NSAIDs  Plan: - continued follow up with nephrology

## 2017-07-12 NOTE — Assessment & Plan Note (Signed)
BP Readings from Last 3 Encounters:  07/11/17 128/87  07/19/16 137/86  10/05/15 (!) 142/88   BP today is 128/87 on current regimen.  Reports adherence with amlodipine 10mg  daily, metoprolol, and triamterene-HCTZ 37.5-25mg  daily. He requests a refill of his amlodipine and diuretics.  Plan: - continue current regimen as noted above.  Refills provided as requested. - Had stable renal function labs in January at his nephrology visit.

## 2017-07-12 NOTE — Assessment & Plan Note (Signed)
Lab Results  Component Value Date   HGBA1C 6.4 07/11/2017    A1c is at goal on metformin 1000mg  BID.    Plan: - continue current regimen - urine microalbumin-creatinine done is less than 30.  Not a candidate for ACE due to angioedema

## 2017-07-12 NOTE — Assessment & Plan Note (Signed)
He continues to take tamsulosin daily without LUTS at this time.  Plan: - Continue tamsulosin 0.4mg  daily

## 2017-07-15 NOTE — Progress Notes (Signed)
Case discussed with Dr. Wallace soon after the resident saw the patient.  We reviewed the resident's history and exam and pertinent patient test results.  I agree with the assessment, diagnosis and plan of care documented in the resident's note. 

## 2017-08-01 ENCOUNTER — Other Ambulatory Visit: Payer: Self-pay | Admitting: Internal Medicine

## 2017-08-01 DIAGNOSIS — I10 Essential (primary) hypertension: Secondary | ICD-10-CM

## 2017-08-15 ENCOUNTER — Other Ambulatory Visit: Payer: Self-pay | Admitting: Internal Medicine

## 2017-09-13 ENCOUNTER — Ambulatory Visit: Payer: Medicaid Other | Admitting: Internal Medicine

## 2017-09-13 ENCOUNTER — Other Ambulatory Visit: Payer: Self-pay | Admitting: Internal Medicine

## 2017-09-13 ENCOUNTER — Other Ambulatory Visit: Payer: Self-pay

## 2017-09-13 VITALS — BP 139/92 | HR 96 | Temp 99.3°F | Ht 70.0 in | Wt 338.4 lb

## 2017-09-13 DIAGNOSIS — Z79899 Other long term (current) drug therapy: Secondary | ICD-10-CM

## 2017-09-13 DIAGNOSIS — R0902 Hypoxemia: Secondary | ICD-10-CM | POA: Diagnosis present

## 2017-09-13 DIAGNOSIS — Z6841 Body Mass Index (BMI) 40.0 and over, adult: Secondary | ICD-10-CM | POA: Diagnosis not present

## 2017-09-13 DIAGNOSIS — E662 Morbid (severe) obesity with alveolar hypoventilation: Secondary | ICD-10-CM

## 2017-09-13 DIAGNOSIS — Z87891 Personal history of nicotine dependence: Secondary | ICD-10-CM | POA: Diagnosis not present

## 2017-09-13 DIAGNOSIS — J301 Allergic rhinitis due to pollen: Secondary | ICD-10-CM

## 2017-09-13 DIAGNOSIS — Z825 Family history of asthma and other chronic lower respiratory diseases: Secondary | ICD-10-CM | POA: Diagnosis not present

## 2017-09-13 DIAGNOSIS — J309 Allergic rhinitis, unspecified: Secondary | ICD-10-CM

## 2017-09-13 DIAGNOSIS — G4733 Obstructive sleep apnea (adult) (pediatric): Secondary | ICD-10-CM

## 2017-09-13 DIAGNOSIS — Z9981 Dependence on supplemental oxygen: Secondary | ICD-10-CM

## 2017-09-13 MED ORDER — ALBUTEROL SULFATE HFA 108 (90 BASE) MCG/ACT IN AERS: 1.0000 | INHALATION_SPRAY | Freq: Four times a day (QID) | RESPIRATORY_TRACT | 3 refills | Status: DC | PRN

## 2017-09-13 MED ORDER — MONTELUKAST SODIUM 10 MG PO TABS: 10.0000 mg | ORAL_TABLET | Freq: Every day | ORAL | 3 refills | Status: DC

## 2017-09-13 NOTE — Progress Notes (Signed)
Medicine attending: Medical history, presenting problems, physical findings, and medications, reviewed with resident physician Dr William Winfrey on the day of the patient visit and I concur with his evaluation and management plan. 

## 2017-09-13 NOTE — Assessment & Plan Note (Signed)
Pt found to be 84% SaO2 shortly after being seated from walking in.  We tested him here in our clinic he was 92-93% SaO2 at rest.  He desaturated quickly with ambulation to 82%.  He required 5L with ambulation to maintain O2 saturation at 94%.  Likely this is mainly caused by his hypoventilation and other complicating factors related to his obesity.  However, he does exhibit some mild wheezing on exam today.  He has a strong family history of asthma multiple first degree relatives, mother with adult onset asthma.    -will get a baseline PFT  -scheduled for May 30th -singulair 10mg , flonase, albuterol inhaler -pt will require 4-5 L of supplemental O2 with ambulation will set this up for pt

## 2017-09-13 NOTE — Progress Notes (Signed)
CC: follow up on SOB, OSA  HPI:  [name] is a 62 y.o. male with PMH below.  He is here to follow up on his SOB, and OSA.    Please see A&P for status of the patient's chronic medical conditions  Past Medical History:  Diagnosis Date  . Allergic rhinitis   . Angioedema   . Bunion   . Diabetes mellitus   . History of hematuria 11/20/2007  . Hyperlipidemia   . Hypertension   . Hypokalemia   . Obesity   . OSA on CPAP   . Pericarditis 1988  . RBBB (right bundle branch block with left anterior fascicular block)   . Renal insufficiency    Cr 1.2 baseline  . Tobacco abuse    Stopped in 2002. Now smokes 1.5ppd  . Unspecified venous (peripheral) insufficiency    Review of Systems:  ROS: Pulmonary: pt endorses increased work of breathing and SOB with ambulation.   Cardiac: pt denies palpitations, chest pain,  Abdominal: pt denies abdominal pain, nausea, vomiting, or diarrhea  Physical Exam:  Vitals:   09/13/17 1004 09/13/17 1155  BP: (!) 172/101 (!) 139/92  Pulse: (!) 108 96  Temp: 99.3 F (37.4 C)   TempSrc: Oral   SpO2: (!) 84%   Weight: (!) 338 lb 6.4 oz (153.5 kg)   Height: 5\' 10"  (1.778 m)    Physical Exam  Constitutional: He appears well-developed and well-nourished.  Eyes: Right eye exhibits no discharge. Left eye exhibits no discharge. No scleral icterus.  Cardiovascular: Normal rate, regular rhythm, normal heart sounds and intact distal pulses. Exam reveals no gallop and no friction rub.  No murmur heard. Pulmonary/Chest: Effort normal. No respiratory distress. He has wheezes (difuse mild expiratory wheezes ). He has no rales.  Abdominal: Soft. Bowel sounds are normal. He exhibits no distension and no mass. There is no tenderness. There is no guarding.  Musculoskeletal: He exhibits edema (pt with compression stockings on underneath trace edema on bilateral LE ).  Neurological: He is alert.    Social History   Socioeconomic History  . Marital  status: Divorced    Spouse name: Not on file  . Number of children: Not on file  . Years of education: Not on file  . Highest education level: Not on file  Occupational History  . Not on file  Social Needs  . Financial resource strain: Not on file  . Food insecurity:    Worry: Not on file    Inability: Not on file  . Transportation needs:    Medical: Not on file    Non-medical: Not on file  Tobacco Use  . Smoking status: Former Smoker    Packs/day: 0.50    Types: Cigarettes    Last attempt to quit: 06/10/2012    Years since quitting: 5.2  . Smokeless tobacco: Never Used  . Tobacco comment: given quit line info  Substance and Sexual Activity  . Alcohol use: Yes    Alcohol/week: 0.0 oz    Comment: On holidays.  . Drug use: No  . Sexual activity: Not on file  Lifestyle  . Physical activity:    Days per week: Not on file    Minutes per session: Not on file  . Stress: Not on file  Relationships  . Social connections:    Talks on phone: Not on file    Gets together: Not on file    Attends religious service: Not on file  Active member of club or organization: Not on file    Attends meetings of clubs or organizations: Not on file    Relationship status: Not on file  . Intimate partner violence:    Fear of current or ex partner: Not on file    Emotionally abused: Not on file    Physically abused: Not on file    Forced sexual activity: Not on file  Other Topics Concern  . Not on file  Social History Narrative   Works as a [employer].   Smokes 1.5 ppd.     Family History  Problem Relation Age of Onset  . Diabetes Father   . Heart disease Father   . Hypertension Father   . Colon cancer Maternal Grandfather   . Colon polyps Maternal Grandfather   . Ovarian cancer Maternal Aunt   . Hypertension Mother     Assessment & Plan:   See Encounters Tab for problem based charting.  Patient discussed with Dr. Cyndie Chime

## 2017-09-13 NOTE — Progress Notes (Signed)
SATURATION QUALIFICATIONS: (This note is used to comply with regulatory documentation for home oxygen)  Patient Saturations on Room Air at Rest = 92-93%  Patient Saturations on Room Air while Ambulating = 84%  Patient Saturations on 2 Liters of oxygen while Ambulating = 88%  Patient Saturations on 3 Liters of oxygen while Ambulating= 88-89%

## 2017-09-13 NOTE — Patient Instructions (Addendum)
Jeremiah Conway, you are having shortness of breath requiring supplemental oxygen.  It will be important to try and continue to lose weight as this will be the best way for you to increase your ability to breathe.  We will prescribe home oxygen for you 5L with ambulation and 2L at rest.  Additionally, there may be a mild asthma component to your SOB therefore I will prescribe an albuterol inhaler and singulair to begin with and we will get a pulmonary function test for you to better characterize this.

## 2017-09-13 NOTE — Assessment & Plan Note (Signed)
Allergic rhinitis with a possible component of allergic asthma could be contributing to the patient's already poor respiratory reserve due to severe obesity.  Says his breathing has been worsened by pollen.    -singulair 10mg  daily, albuterol inhaler prescribed -continue flonase

## 2017-09-13 NOTE — Assessment & Plan Note (Addendum)
Pt will require new titration study as he has not been using his bipap due to difficulty with mask.  Still having symptoms of severe sleep apnea, daytime somnolence, snoring, morning headaches.    -placed order for new split night study  -instructed pt on importance of weight loss, is not interested in gastric bypass referral at this time

## 2017-09-17 ENCOUNTER — Ambulatory Visit: Payer: Medicaid Other | Admitting: Internal Medicine

## 2017-09-17 ENCOUNTER — Other Ambulatory Visit: Payer: Self-pay | Admitting: Internal Medicine

## 2017-09-17 ENCOUNTER — Encounter: Payer: Self-pay | Admitting: Internal Medicine

## 2017-09-17 ENCOUNTER — Ambulatory Visit (INDEPENDENT_AMBULATORY_CARE_PROVIDER_SITE_OTHER): Payer: Medicaid Other | Admitting: Dietician

## 2017-09-17 ENCOUNTER — Encounter: Payer: Self-pay | Admitting: Dietician

## 2017-09-17 ENCOUNTER — Ambulatory Visit (INDEPENDENT_AMBULATORY_CARE_PROVIDER_SITE_OTHER): Payer: Medicaid Other | Admitting: Internal Medicine

## 2017-09-17 ENCOUNTER — Ambulatory Visit (HOSPITAL_COMMUNITY)
Admission: RE | Admit: 2017-09-17 | Discharge: 2017-09-17 | Disposition: A | Discharge status: Home / Self Care | Payer: Medicaid Other | Source: Ambulatory Visit | Attending: Internal Medicine | Admitting: Internal Medicine

## 2017-09-17 ENCOUNTER — Other Ambulatory Visit: Payer: Self-pay

## 2017-09-17 VITALS — BP 137/79 | HR 100 | Temp 98.1°F | Ht 70.0 in | Wt 340.5 lb

## 2017-09-17 DIAGNOSIS — Z9981 Dependence on supplemental oxygen: Secondary | ICD-10-CM

## 2017-09-17 DIAGNOSIS — R0902 Hypoxemia: Secondary | ICD-10-CM | POA: Diagnosis not present

## 2017-09-17 DIAGNOSIS — E1129 Type 2 diabetes mellitus with other diabetic kidney complication: Secondary | ICD-10-CM | POA: Diagnosis not present

## 2017-09-17 DIAGNOSIS — G4733 Obstructive sleep apnea (adult) (pediatric): Secondary | ICD-10-CM

## 2017-09-17 DIAGNOSIS — J9811 Atelectasis: Secondary | ICD-10-CM | POA: Diagnosis not present

## 2017-09-17 DIAGNOSIS — I509 Heart failure, unspecified: Secondary | ICD-10-CM

## 2017-09-17 DIAGNOSIS — I110 Hypertensive heart disease with heart failure: Secondary | ICD-10-CM

## 2017-09-17 DIAGNOSIS — R0602 Shortness of breath: Secondary | ICD-10-CM | POA: Insufficient documentation

## 2017-09-17 DIAGNOSIS — Z713 Dietary counseling and surveillance: Secondary | ICD-10-CM

## 2017-09-17 DIAGNOSIS — Z6841 Body Mass Index (BMI) 40.0 and over, adult: Secondary | ICD-10-CM

## 2017-09-17 DIAGNOSIS — I10 Essential (primary) hypertension: Secondary | ICD-10-CM | POA: Diagnosis not present

## 2017-09-17 DIAGNOSIS — I5020 Unspecified systolic (congestive) heart failure: Secondary | ICD-10-CM | POA: Insufficient documentation

## 2017-09-17 DIAGNOSIS — Z87891 Personal history of nicotine dependence: Secondary | ICD-10-CM | POA: Diagnosis not present

## 2017-09-17 DIAGNOSIS — Z79899 Other long term (current) drug therapy: Secondary | ICD-10-CM

## 2017-09-17 DIAGNOSIS — Z8249 Family history of ischemic heart disease and other diseases of the circulatory system: Secondary | ICD-10-CM | POA: Diagnosis not present

## 2017-09-17 LAB — COMPREHENSIVE METABOLIC PANEL
ALBUMIN: 3.3 g/dL — AB (ref 3.5–5.0)
ALK PHOS: 60 U/L (ref 38–126)
ALT: 34 U/L (ref 17–63)
ANION GAP: 8 (ref 5–15)
AST: 18 U/L (ref 15–41)
BILIRUBIN TOTAL: 1 mg/dL (ref 0.3–1.2)
BUN: 17 mg/dL (ref 6–20)
CALCIUM: 10 mg/dL (ref 8.9–10.3)
CO2: 33 mmol/L — ABNORMAL HIGH (ref 22–32)
Chloride: 103 mmol/L (ref 101–111)
Creatinine, Ser: 1.56 mg/dL — ABNORMAL HIGH (ref 0.61–1.24)
GFR calc Af Amer: 54 mL/min — ABNORMAL LOW (ref >60)
GFR calc non Af Amer: 46 mL/min — ABNORMAL LOW (ref >60)
GLUCOSE: 149 mg/dL — AB (ref 65–99)
POTASSIUM: 3.8 mmol/L (ref 3.5–5.1)
Sodium: 144 mmol/L (ref 135–145)
TOTAL PROTEIN: 6.2 g/dL — AB (ref 6.5–8.1)

## 2017-09-17 LAB — PULMONARY FUNCTION TEST
DL/VA % PRED: 100 %
DL/VA: 4.6 ml/min/mmHg/L
DLCO unc % pred: 59 %
DLCO unc: 18.47 ml/min/mmHg
FEF 25-75 Post: 1.52 L/sec
FEF 25-75 Pre: 1.1 L/sec
FEF2575-%CHANGE-POST: 37 %
FEF2575-%Pred-Post: 55 %
FEF2575-%Pred-Pre: 40 %
FEV1-%CHANGE-POST: 7 %
FEV1-%PRED-POST: 58 %
FEV1-%PRED-PRE: 54 %
FEV1-PRE: 1.62 L
FEV1-Post: 1.74 L
FEV1FVC-%CHANGE-POST: 2 %
FEV1FVC-%Pred-Pre: 93 %
FEV6-%Change-Post: 5 %
FEV6-%PRED-PRE: 59 %
FEV6-%Pred-Post: 63 %
FEV6-POST: 2.33 L
FEV6-Pre: 2.2 L
FEV6FVC-%Change-Post: 0 %
FEV6FVC-%PRED-POST: 104 %
FEV6FVC-%PRED-PRE: 103 %
FVC-%CHANGE-POST: 5 %
FVC-%PRED-PRE: 57 %
FVC-%Pred-Post: 60 %
FVC-POST: 2.33 L
FVC-PRE: 2.21 L
POST FEV6/FVC RATIO: 100 %
Post FEV1/FVC ratio: 75 %
Pre FEV1/FVC ratio: 73 %
Pre FEV6/FVC Ratio: 100 %
RV % pred: 138 %
RV: 3.08 L
TLC % PRED: 79 %
TLC: 5.42 L

## 2017-09-17 LAB — CBC
HEMATOCRIT: 39.7 % (ref 39.0–52.0)
HEMOGLOBIN: 12 g/dL — AB (ref 13.0–17.0)
MCH: 26.3 pg (ref 26.0–34.0)
MCHC: 30.2 g/dL (ref 30.0–36.0)
MCV: 86.9 fL (ref 78.0–100.0)
Platelets: 234 10*3/uL (ref 150–400)
RBC: 4.57 MIL/uL (ref 4.22–5.81)
RDW: 16.8 % — AB (ref 11.5–15.5)
WBC: 6.2 10*3/uL (ref 4.0–10.5)

## 2017-09-17 LAB — BRAIN NATRIURETIC PEPTIDE: B NATRIURETIC PEPTIDE 5: 503.7 pg/mL — AB (ref 0.0–100.0)

## 2017-09-17 MED ORDER — METOPROLOL TARTRATE 50 MG PO TABS: 50.0000 mg | ORAL_TABLET | Freq: Two times a day (BID) | ORAL | 1 refills | Status: DC

## 2017-09-17 MED ORDER — FUROSEMIDE 20 MG PO TABS: 20.0000 mg | ORAL_TABLET | Freq: Every day | ORAL | 0 refills | Status: DC

## 2017-09-17 NOTE — Progress Notes (Signed)
Patient completed full PFT today. Patient's O2 dropped to 82-84% in between each test during the PFT.

## 2017-09-17 NOTE — Progress Notes (Signed)
CC: follow up on HTN, Hypoxemia requiring supplemental O2  HPI:  [name] is a 62 y.o. male with PMH below.  He is here to address his continued hypoxemia and HTN.    Please see A&P for status of the patient's chronic medical conditions  Past Medical History:  Diagnosis Date  . Allergic rhinitis   . Angioedema   . Bunion   . Diabetes mellitus   . History of hematuria 11/20/2007  . Hyperlipidemia   . Hypertension   . Hypokalemia   . Obesity   . OSA on CPAP   . Pericarditis 1988  . RBBB (right bundle branch block with left anterior fascicular block)   . Renal insufficiency    Cr 1.2 baseline  . Tobacco abuse    Stopped in 2002. Now smokes 1.5ppd  . Unspecified venous (peripheral) insufficiency    Review of Systems:  ROS: Pulmonary: increased SOB with ambulation, not at rest Cardiac: pt denies palpitations, chest pain,  Abdominal: pt denies abdominal pain, nausea, vomiting, or diarrhea  Physical Exam:  Vitals:   09/17/17 1419  BP: 137/79  Pulse: 100  Temp: 98.1 F (36.7 C)  SpO2: 100%  Weight: (!) 340 lb 8 oz (154.4 kg)  Height: 5\' 10"  (1.778 m)   Physical Exam  Constitutional: He appears well-developed and well-nourished.  Eyes: Right eye exhibits no discharge. Left eye exhibits no discharge. No scleral icterus.  Neck: No JVD present.  Cardiovascular: Normal rate, regular rhythm, normal heart sounds and intact distal pulses. Exam reveals no gallop and no friction rub.  No murmur heard. Pulmonary/Chest: Effort normal and breath sounds normal. No respiratory distress. He has no wheezes. He has no rales.  Abdominal: Soft. Bowel sounds are normal. He exhibits no distension and no mass. There is no tenderness. There is no guarding.  Musculoskeletal: He exhibits edema (bilterally LE compression stockings on).  Neurological: He is alert.    Social History   Socioeconomic History  . Marital status: Divorced    Spouse name: Not on file  . Number of  children: Not on file  . Years of education: Not on file  . Highest education level: Not on file  Occupational History  . Not on file  Social Needs  . Financial resource strain: Not on file  . Food insecurity:    Worry: Not on file    Inability: Not on file  . Transportation needs:    Medical: Not on file    Non-medical: Not on file  Tobacco Use  . Smoking status: Former Smoker    Packs/day: 0.50    Types: Cigarettes    Last attempt to quit: 06/10/2012    Years since quitting: 5.2  . Smokeless tobacco: Never Used  . Tobacco comment: given quit line info  Substance and Sexual Activity  . Alcohol use: Yes    Alcohol/week: 0.0 oz    Comment: On holidays.  . Drug use: No  . Sexual activity: Not on file  Lifestyle  . Physical activity:    Days per week: Not on file    Minutes per session: Not on file  . Stress: Not on file  Relationships  . Social connections:    Talks on phone: Not on file    Gets together: Not on file    Attends religious service: Not on file    Active member of club or organization: Not on file    Attends meetings of clubs or organizations: Not on file  Relationship status: Not on file  . Intimate partner violence:    Fear of current or ex partner: Not on file    Emotionally abused: Not on file    Physically abused: Not on file    Forced sexual activity: Not on file  Other Topics Concern  . Not on file  Social History Narrative   Works as a [employer].   Smokes 1.5 ppd.     Family History  Problem Relation Age of Onset  . Diabetes Father   . Heart disease Father   . Hypertension Father   . Colon cancer Maternal Grandfather   . Colon polyps Maternal Grandfather   . Ovarian cancer Maternal Aunt   . Hypertension Mother     Assessment & Plan:   See Encounters Tab for problem based charting.  Patient discussed with Dr. Cleda Daub

## 2017-09-17 NOTE — Addendum Note (Signed)
Addended by: Chana Bode B on: 09/17/2017 05:10 PM   Modules accepted: Orders

## 2017-09-17 NOTE — Assessment & Plan Note (Addendum)
BP Readings from Last 3 Encounters:  09/17/17 137/79  09/13/17 (!) 139/92  07/11/17 128/87   bp within goal today, continue amlodipine 10mg , metoprolol tartrate 50mg  BID, and maxzide 37.5-25

## 2017-09-17 NOTE — Assessment & Plan Note (Addendum)
Features of pts PFTs and physical exam suggests a component of restrictive disease related to his obesity.  Pt was hypoxemic on arrival today, his oxygen was out and he desaturated to 78% on his way in to the clinic shortly after being seated.  Given his uncontrolled sleep apnea and morbid obesity it appears he has developed congestive heart failure.  His BNP was found to be elevated at 500 and some mild interstitial edema is seen on his x-ray and enlargement of his heart is apparent.  The patient was walked with pulse oximetry and was found to require 5L [place] oxygen with ambulation to maintain O2 saturation of 96%.  No oxygen was required at rest.    -split night study ordered last visit -will require continue supplemental O2 5L with ambulation -will start patient on 20mg  Lasix daily and reassess at next visit.

## 2017-09-17 NOTE — Addendum Note (Signed)
Addended by: Chana Bode B on: 09/17/2017 04:59 PM   Modules accepted: Orders

## 2017-09-17 NOTE — Assessment & Plan Note (Addendum)
Features of pts PFTs and physical exam suggests a component of restrictive disease related to his obesity.  Pt was hypoxemic on arrival today, his oxygen was out and he desaturated to 78% on his way in to the clinic shortly after being seated.    -split night study ordered last visit -continue supplemental O2 4L with ambulation -will check cbc, cmp, bnp, chest x-ray

## 2017-09-17 NOTE — Patient Instructions (Addendum)
Calorie Counting for Weight Loss  Calories are units of energy. Your body needs a certain amount of calories from food to keep you going throughout the day. When you eat more calories than your body needs, your body stores the extra calories as fat. When you eat fewer calories than your body needs, your body burns fat to get the energy it needs.  Calorie counting means keeping track of how many calories you eat and drink each day. Calorie counting can be helpful if you need to lose weight. If you make sure to eat fewer calories than your body needs, you should lose weight. Ask your health care provider what a healthy weight is for you. For calorie counting to work, you will need to eat the right number of calories in a day in order to lose a healthy amount of weight per week. A dietitian can help you determine how many calories you need in a day and will give you suggestions on how to reach your calorie goal.   A healthy amount of weight to lose per week is usually 1-2 lb (0.5-0.9 kg). This usually means that your daily calorie intake should be reduced by 500-750 calories.  Eating 1,200 - 1,500 calories per day can help most women lose weight.  Eating 1,500 - 1,800 calories per day can help most men lose weight.  What is my plan? My goal is to have __1800________ calories per day. If I have this many calories per day, I should lose around ____1-2______ pounds per week.  What do I need to know about calorie counting? In order to meet your daily calorie goal, you will need to:  Find out how many calories are in each food you would like to eat. Try to do this before you eat.  Decide how much of the food you plan to eat.  Write down what you ate and how many calories it had. Doing this is called keeping a food log.  To successfully lose weight, it is important to balance calorie counting with a healthy lifestyle that includes regular activity. Aim for 150 minutes of moderate exercise (such as  walking) or 75 minutes of vigorous exercise (such as running) each week.  Where do I find calorie information?  The number of calories in a food can be found on a Nutrition Facts label. If a food does not have a Nutrition Facts label, try to look up the calories online or ask your dietitian for help. Remember that calories are listed per serving. If you choose to have more than one serving of a food, you will have to multiply the calories per serving by the amount of servings you plan to eat. For example, the label on a package of bread might say that a serving size is 1 slice and that there are 90 calories in a serving. If you eat 1 slice, you will have eaten 90 calories. If you eat 2 slices, you will have eaten 180 calories.  What are some calorie counting tips?  Use your calories on foods and drinks that will fill you up and not leave you hungry: ? Some examples of foods that fill you up are nuts and nut butters, vegetables, lean proteins, and high-fiber foods like whole grains. High-fiber foods are foods with more than 5 g fiber per serving. ? Drinks such as sodas, specialty coffee drinks, alcohol, and juices have a lot of calories, yet do not fill you up.  Eat nutritious foods and avoid  empty calories. Empty calories are calories you get from foods or beverages that do not have many vitamins or protein, such as candy, sweets, and soda. It is better to have a nutritious high-calorie food (such as an avocado) than a food with few nutrients (such as a bag of chips).  Know how many calories are in the foods you eat most often. This will help you calculate calorie counts faster.  Pay attention to calories in drinks. Low-calorie drinks include water and unsweetened drinks.  Pay attention to nutrition labels for "low fat" or "fat free" foods. These foods sometimes have the same amount of calories or more calories than the full fat versions. They also often have added sugar, starch, or salt, to make  up for flavor that was removed with the fat.  Find a way of tracking calories that works for you. Get creative. Try different apps or programs if writing down calories does not work for you. What are some portion control tips?  Know how many calories are in a serving. This will help you know how many servings of a certain food you can have.    Try not to eat straight from a bag or box. Doing this can lead to overeating. Put the amount you would like to eat in a cup or on a plate to make sure you are eating the right portion.  Use smaller plates, glasses, and bowls to prevent overeating.  Try not to multitask (for example, watch TV or use your computer) while eating. If it is time to eat, sit down at a table and enjoy your food. This will help you to know when you are full. It will also help you to be aware of what you are eating and how much you are eating. What are tips for following this plan? Reading food labels  Check the calorie count compared to the serving size. The serving size may be smaller than what you are used to eating.  Check the source of the calories. Make sure the food you are eating is high in vitamins and protein and low in saturated and trans fats. Shopping  Read nutrition labels while you shop. This will help you make healthy decisions before you decide to purchase your food.  Make a grocery list and stick to it. Cooking  Try to cook your favorite foods in a healthier way. For example, try baking instead of frying.  Use low-fat dairy products. Meal planning  Use more fruits and vegetables. Half of your plate should be fruits and vegetables.  Include lean proteins like poultry and fish. ?  Summary  Calorie counting means keeping track of how many calories you eat and drink each day. If you eat fewer calories than your body needs, you should lose weight.  A healthy amount of weight to lose per week is usually 1-2 lb (0.5-0.9 kg). This usually means reducing  your daily calorie intake by 500-750 calories.  The number of calories in a food can be found on a Nutrition Facts label. If a food does not have a Nutrition Facts label, try to look up the calories online or ask your dietitian for help.  Use your calories on foods and drinks that will fill you up, and not on foods and drinks that will leave you hungry.  Use smaller plates, glasses, and bowls to prevent overeating.   Please make an appointment to follow up with me in about 2-3 weeks

## 2017-09-17 NOTE — Addendum Note (Signed)
Addended by: Chana Bode B on: 09/17/2017 03:19 PM   Modules accepted: Orders

## 2017-09-17 NOTE — Patient Instructions (Addendum)
Mr. Ahlman, I'm glad you are doing well.  Your blood pressure looks good, you will need to take your metoprolol 50mg  twice daily instead of once daily.  Your PFT results are suggestive that you have a restrictive component contributing to your shortness of breath.  We will get some labs and an x-ray to continue your workup of factors that may be contributing to your hypoxemia (low oxygen levels).

## 2017-09-17 NOTE — Addendum Note (Signed)
Addended by: Remus Blake on: 09/17/2017 03:26 PM   Modules accepted: Orders

## 2017-09-17 NOTE — Progress Notes (Signed)
Diabetes Self-Management Education  Visit Type: First/Initial  Appt. Start Time: 1515 Appt. End Time: 1545  09/17/2017  Mr. Jeremiah Conway, identified by name and date of birth, is a 62 y.o. male with a diagnosis of Diabetes: Type 2.   ASSESSMENT Lab Results  Component Value Date   HGBA1C 6.4 07/11/2017   Wt Readings from Last 3 Encounters:  09/17/17 (!) 340 lb 8 oz (154.4 kg)  09/13/17 (!) 338 lb 6.4 oz (153.5 kg)  07/11/17 (!) 330 lb 8 oz (149.9 kg)   Estimated body mass index is 48.86 kg/m as calculated from the following:   Height as of an earlier encounter on 09/17/17: 5\' 10"  (1.778 m).   Weight as of an earlier encounter on 09/17/17: 340 lb 8 oz (154.4 kg).  Current Outpatient Medications on File Prior to Visit  Medication Sig Dispense Refill  . albuterol (PROVENTIL HFA;VENTOLIN HFA) 108 (90 Base) MCG/ACT inhaler Inhale 1-2 puffs into the lungs every 6 (six) hours as needed for wheezing or shortness of breath. 1 Inhaler 3  . amLODipine (NORVASC) 10 MG tablet TAKE 1 TABLET BY MOUTH DAILY 90 tablet 3  . aspirin 81 MG tablet Take 1 tablet (81 mg total) by mouth daily. 30 tablet 11  . atorvastatin (LIPITOR) 80 MG tablet TAKE 1 TABLET BY MOUTH DAILY 90 tablet 3  . Elastic Bandages & Supports (V-5 HIGH COMPRESSION HOSE) MISC 1pair, Wear them while driving, walking, standing 1 each 1  . fluticasone (FLONASE) 50 MCG/ACT nasal spray Place 1 spray into both nostrils daily.    . furosemide (LASIX) 20 MG tablet Take 1 tablet (20 mg total) by mouth daily. 30 tablet 0  . metFORMIN (GLUCOPHAGE) 1000 MG tablet TAKE 1 TABLET BY MOUTH TWICE DAILY WITH A MEAL 180 tablet 1  . metoprolol tartrate (LOPRESSOR) 50 MG tablet Take 1 tablet (50 mg total) by mouth 2 (two) times daily. 180 tablet 1  . montelukast (SINGULAIR) 10 MG tablet Take 1 tablet (10 mg total) by mouth at bedtime. 90 tablet 3  . tamsulosin (FLOMAX) 0.4 MG CAPS capsule TAKE ONE CAPSULE BY MOUTH DAILY AFTER BREAKFAST 90 capsule 3  .  triamterene-hydrochlorothiazide (MAXZIDE-25) 37.5-25 MG tablet TAKE 1 TABLET BY MOUTH EVERY DAY 90 tablet 3   No current facility-administered medications on file prior to visit.      Diabetes Self-Management Education - 09/17/17 1700      Visit Information   Visit Type  First/Initial      Initial Visit   Diabetes Type  Type 2    Are you currently following a meal plan?  No    Are you taking your medications as prescribed?  Yes      Psychosocial Assessment   Patient Belief/Attitude about Diabetes  Motivated to manage diabetes    Self-care barriers  Debilitated state due to current medical condition;Lack of material resources    Self-management support  Doctor's office;Family;CDE visits    Other persons present  Parent    Patient Concerns  Nutrition/Meal planning;Weight Control    Special Needs  None    Preferred Learning Style  Visual    Learning Readiness  Ready      Pre-Education Assessment   Patient understands incorporating nutritional management into lifestyle.  Needs Review    Patient understands monitoring blood glucose, interpreting and using results  Needs Review    Patient understands how to develop strategies to promote health/change behavior.  Needs Review      Complications  Last HgB A1C per patient/outside source  -- 6.5    How often do you check your blood sugar?  0 times/day (not testing)      Exercise   Exercise Type  ADL's      Patient Education   Nutrition management   Role of diet in the treatment of diabetes and the relationship between the three main macronutrients and blood glucose level;Meal options for control of blood glucose level and chronic complications.    Monitoring  Purpose and frequency of SMBG.      Individualized Goals (developed by patient)   Nutrition  General guidelines for healthy choices and portions discussed      Outcomes   Expected Outcomes  Demonstrated interest in learning. Expect positive outcomes    Future DMSE  2 wks     Program Status  Not Completed       Individualized Plan for Diabetes Self-Management Training:   Learning Objective:  Patient will have a greater understanding of diabetes self-management. Patient education plan is to attend individual and/or group sessions per assessed needs and concerns.   Plan:   Patient Instructions  Calorie Counting for Weight Loss  Calories are units of energy. Your body needs a certain amount of calories from food to keep you going throughout the day. When you eat more calories than your body needs, your body stores the extra calories as fat. When you eat fewer calories than your body needs, your body burns fat to get the energy it needs.  Calorie counting means keeping track of how many calories you eat and drink each day. Calorie counting can be helpful if you need to lose weight. If you make sure to eat fewer calories than your body needs, you should lose weight. Ask your health care provider what a healthy weight is for you. For calorie counting to work, you will need to eat the right number of calories in a day in order to lose a healthy amount of weight per week. A dietitian can help you determine how many calories you need in a day and will give you suggestions on how to reach your calorie goal.   A healthy amount of weight to lose per week is usually 1-2 lb (0.5-0.9 kg). This usually means that your daily calorie intake should be reduced by 500-750 calories.  Eating 1,200 - 1,500 calories per day can help most women lose weight.  Eating 1,500 - 1,800 calories per day can help most men lose weight.  What is my plan? My goal is to have __1800________ calories per day. If I have this many calories per day, I should lose around ____1-2______ pounds per week.  What do I need to know about calorie counting? In order to meet your daily calorie goal, you will need to:  Find out how many calories are in each food you would like to eat. Try to do this before you  eat.  Decide how much of the food you plan to eat.  Write down what you ate and how many calories it had. Doing this is called keeping a food log.  To successfully lose weight, it is important to balance calorie counting with a healthy lifestyle that includes regular activity. Aim for 150 minutes of moderate exercise (such as walking) or 75 minutes of vigorous exercise (such as running) each week.  Where do I find calorie information?  The number of calories in a food can be found on a Nutrition Facts label. If  a food does not have a Nutrition Facts label, try to look up the calories online or ask your dietitian for help. Remember that calories are listed per serving. If you choose to have more than one serving of a food, you will have to multiply the calories per serving by the amount of servings you plan to eat. For example, the label on a package of bread might say that a serving size is 1 slice and that there are 90 calories in a serving. If you eat 1 slice, you will have eaten 90 calories. If you eat 2 slices, you will have eaten 180 calories.  What are some calorie counting tips?  Use your calories on foods and drinks that will fill you up and not leave you hungry: ? Some examples of foods that fill you up are nuts and nut butters, vegetables, lean proteins, and high-fiber foods like whole grains. High-fiber foods are foods with more than 5 g fiber per serving. ? Drinks such as sodas, specialty coffee drinks, alcohol, and juices have a lot of calories, yet do not fill you up.  Eat nutritious foods and avoid empty calories. Empty calories are calories you get from foods or beverages that do not have many vitamins or protein, such as candy, sweets, and soda. It is better to have a nutritious high-calorie food (such as an avocado) than a food with few nutrients (such as a bag of chips).  Know how many calories are in the foods you eat most often. This will help you calculate calorie counts  faster.  Pay attention to calories in drinks. Low-calorie drinks include water and unsweetened drinks.  Pay attention to nutrition labels for "low fat" or "fat free" foods. These foods sometimes have the same amount of calories or more calories than the full fat versions. They also often have added sugar, starch, or salt, to make up for flavor that was removed with the fat.  Find a way of tracking calories that works for you. Get creative. Try different apps or programs if writing down calories does not work for you. What are some portion control tips?  Know how many calories are in a serving. This will help you know how many servings of a certain food you can have.    Try not to eat straight from a bag or box. Doing this can lead to overeating. Put the amount you would like to eat in a cup or on a plate to make sure you are eating the right portion.  Use smaller plates, glasses, and bowls to prevent overeating.  Try not to multitask (for example, watch TV or use your computer) while eating. If it is time to eat, sit down at a table and enjoy your food. This will help you to know when you are full. It will also help you to be aware of what you are eating and how much you are eating. What are tips for following this plan? Reading food labels  Check the calorie count compared to the serving size. The serving size may be smaller than what you are used to eating.  Check the source of the calories. Make sure the food you are eating is high in vitamins and protein and low in saturated and trans fats. Shopping  Read nutrition labels while you shop. This will help you make healthy decisions before you decide to purchase your food.  Make a grocery list and stick to it. Cooking  Try to [employer] your favorite  foods in a healthier way. For example, try baking instead of frying.  Use low-fat dairy products. Meal planning  Use more fruits and vegetables. Half of your plate should be fruits and  vegetables.  Include lean proteins like poultry and fish. ?  Summary  Calorie counting means keeping track of how many calories you eat and drink each day. If you eat fewer calories than your body needs, you should lose weight.  A healthy amount of weight to lose per week is usually 1-2 lb (0.5-0.9 kg). This usually means reducing your daily calorie intake by 500-750 calories.  The number of calories in a food can be found on a Nutrition Facts label. If a food does not have a Nutrition Facts label, try to look up the calories online or ask your dietitian for help.  Use your calories on foods and drinks that will fill you up, and not on foods and drinks that will leave you hungry.  Use smaller plates, glasses, and bowls to prevent overeating.   Please make an appointment to follow up with me in about 2-3 weeks   Expected Outcomes:  Demonstrated interest in learning. Expect positive outcomes  Education material provided: My Plate  If problems or questions, patient to contact team via:  Phone  Future DSME appointment: 2 wks- 3 wks Norm Parcel, RD 09/17/2017 5:42 PM.

## 2017-09-17 NOTE — Addendum Note (Signed)
Addended by: Remus Blake on: 09/17/2017 03:29 PM   Modules accepted: Orders

## 2017-09-18 NOTE — Progress Notes (Signed)
[facility] Attending  Case discussed with Dr. Winfrey  at the time of the visit.  We reviewed the resident's history and exam and pertinent patient test results.  I agree with the assessment, diagnosis, and plan of care documented in the resident's note.  

## 2017-09-20 ENCOUNTER — Ambulatory Visit: Payer: Medicaid Other | Admitting: Podiatry

## 2017-09-26 ENCOUNTER — Encounter (HOSPITAL_COMMUNITY): Payer: Medicaid Other

## 2017-09-26 ENCOUNTER — Ambulatory Visit: Payer: Medicaid Other

## 2017-09-27 ENCOUNTER — Encounter: Payer: Self-pay | Admitting: Podiatry

## 2017-09-27 ENCOUNTER — Ambulatory Visit: Payer: Medicaid Other | Admitting: Podiatry

## 2017-09-27 DIAGNOSIS — M79675 Pain in left toe(s): Secondary | ICD-10-CM | POA: Diagnosis not present

## 2017-09-27 DIAGNOSIS — B351 Tinea unguium: Secondary | ICD-10-CM

## 2017-09-27 DIAGNOSIS — N183 Chronic kidney disease, stage 3 unspecified: Secondary | ICD-10-CM

## 2017-09-27 DIAGNOSIS — M2141 Flat foot [pes planus] (acquired), right foot: Secondary | ICD-10-CM

## 2017-09-27 DIAGNOSIS — M79674 Pain in right toe(s): Secondary | ICD-10-CM | POA: Diagnosis not present

## 2017-09-27 DIAGNOSIS — E1122 Type 2 diabetes mellitus with diabetic chronic kidney disease: Secondary | ICD-10-CM

## 2017-09-27 DIAGNOSIS — M2142 Flat foot [pes planus] (acquired), left foot: Secondary | ICD-10-CM

## 2017-09-28 ENCOUNTER — Encounter: Payer: Self-pay | Admitting: Podiatry

## 2017-09-28 NOTE — Progress Notes (Addendum)
Subjective: Patient presents today with diabetes and cc of painful, discolored, thick toenails which interfere with activities of daily living. Pain is aggravated when wearing enclosed shoe gear. Pain is relieved with periodic professional debridement.  Objective: BP: 124/86 right arm Pulse 86 Temp: 98.1 SP O2: 100 Vascular Examination: Capillary refill time <3 seconds x 10 digits Dorsalis pedis and posterior tibial pulses present b/l No digital hair x 10 digits Skin temperature warm to warm b/l LE pedal edema b/l  Dermatological Examination: Toenails 1-5 b/l discolored, thick, dystrophic with subungual debris and pain with palpation to nailbeds due to thickness of nails.  Musculoskeletal: Muscle strength 5/5 to all LE muscle groups Pes planus foot deformity b/l   Neurological: Sensation intact with 10 gram monofilament. Vibratory sensation intact.  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 Toenails 1-5 b/l were debrided in length and girth without iatrogenic bleeding. 2. Patient to continue soft, supportive shoe gear 3. Patient to report any pedal injuries to medical professional immediately. 4. Follow up 3 months. Patient/POA to call should there be a concern in the interim.

## 2017-09-30 ENCOUNTER — Telehealth: Payer: Self-pay | Admitting: Internal Medicine

## 2017-09-30 ENCOUNTER — Ambulatory Visit (HOSPITAL_COMMUNITY)
Admission: RE | Admit: 2017-09-30 | Discharge: 2017-09-30 | Disposition: A | Discharge status: Home / Self Care | Payer: Medicaid Other | Source: Ambulatory Visit | Attending: Internal Medicine | Admitting: Internal Medicine

## 2017-09-30 DIAGNOSIS — Z87891 Personal history of nicotine dependence: Secondary | ICD-10-CM | POA: Insufficient documentation

## 2017-09-30 DIAGNOSIS — I083 Combined rheumatic disorders of mitral, aortic and tricuspid valves: Secondary | ICD-10-CM | POA: Insufficient documentation

## 2017-09-30 DIAGNOSIS — R0902 Hypoxemia: Secondary | ICD-10-CM | POA: Diagnosis not present

## 2017-09-30 DIAGNOSIS — Z9981 Dependence on supplemental oxygen: Secondary | ICD-10-CM

## 2017-09-30 DIAGNOSIS — I509 Heart failure, unspecified: Secondary | ICD-10-CM | POA: Diagnosis present

## 2017-09-30 DIAGNOSIS — I110 Hypertensive heart disease with heart failure: Secondary | ICD-10-CM | POA: Diagnosis not present

## 2017-09-30 DIAGNOSIS — E785 Hyperlipidemia, unspecified: Secondary | ICD-10-CM | POA: Insufficient documentation

## 2017-09-30 DIAGNOSIS — E119 Type 2 diabetes mellitus without complications: Secondary | ICD-10-CM | POA: Diagnosis not present

## 2017-09-30 NOTE — Telephone Encounter (Signed)
spoke with pt about echo results, he is making follow up appointment to discuss condition further, start HFrEF therapy and also referral to cardiology

## 2017-09-30 NOTE — Progress Notes (Signed)
  Echocardiogram 2D Echocardiogram has been performed.  Jeremiah Conway 09/30/2017, 8:59 AM

## 2017-10-04 ENCOUNTER — Other Ambulatory Visit: Payer: Self-pay | Admitting: Internal Medicine

## 2017-10-04 ENCOUNTER — Other Ambulatory Visit: Payer: Self-pay

## 2017-10-04 ENCOUNTER — Encounter: Payer: Self-pay | Admitting: Internal Medicine

## 2017-10-04 ENCOUNTER — Ambulatory Visit: Payer: Medicaid Other | Admitting: Internal Medicine

## 2017-10-04 DIAGNOSIS — Z79899 Other long term (current) drug therapy: Secondary | ICD-10-CM

## 2017-10-04 DIAGNOSIS — R0902 Hypoxemia: Secondary | ICD-10-CM | POA: Diagnosis not present

## 2017-10-04 DIAGNOSIS — I509 Heart failure, unspecified: Secondary | ICD-10-CM

## 2017-10-04 DIAGNOSIS — I5032 Chronic diastolic (congestive) heart failure: Secondary | ICD-10-CM | POA: Diagnosis present

## 2017-10-04 MED ORDER — FUROSEMIDE 20 MG PO TABS: 60.0000 mg | ORAL_TABLET | Freq: Every day | ORAL | 0 refills | Status: DC

## 2017-10-04 NOTE — Patient Instructions (Addendum)
Jeremiah Conway,  I am going to stop your triamterene-hydrochlorothiazide (maxzide) medication today. Instead I am going to increase your Lasix dose from that 20 mg daily in the morning to 60 mg daily in the morning.  Should try and limit your salt intake as well as limit your fluid intake to less than 2 L a day (half a gallon).   You should come back and see me on Monday and we can see how you are doing and make further adjustments as needed.  I am also going to get you set up to see the heart doctors at some point in the future as well.   Heart Failure Heart failure means your heart has trouble pumping blood. This makes it hard for your body to work well. Heart failure is usually a long-term (chronic) condition. You must take good care of yourself and follow your doctor's treatment plan. Follow these instructions at home:  Take your heart medicine as told by your doctor. ? Do not stop taking medicine unless your doctor tells you to. ? Do not skip any dose of medicine. ? Refill your medicines before they run out. ? Take other medicines only as told by your doctor or pharmacist.  Stay active if told by your doctor. The elderly and people with severe heart failure should talk with a doctor about physical activity.  Eat heart-healthy foods. Choose foods that are without trans fat and are low in saturated fat, cholesterol, and salt (sodium). This includes fresh or frozen fruits and vegetables, fish, lean meats, fat-free or low-fat dairy foods, whole grains, and high-fiber foods. Lentils and dried peas and beans (legumes) are also good choices.  Limit salt if told by your doctor.  Cook in a healthy way. Roast, grill, broil, bake, poach, steam, or stir-fry foods.  Limit fluids as told by your doctor.  Weigh yourself every morning. Do this after you pee (urinate) and before you eat breakfast. Write down your weight to give to your doctor.  Take your blood pressure and write it down if your doctor  tells you to.  Ask your doctor how to check your pulse. Check your pulse as told.  Lose weight if told by your doctor.  Stop smoking or chewing tobacco. Do not use gum or patches that help you quit without your doctor's approval.  Schedule and go to doctor visits as told.  Nonpregnant women should have no more than 1 drink a day. Men should have no more than 2 drinks a day. Talk to your doctor about drinking alcohol.  Stop illegal drug use.  Stay current with shots (immunizations).  Manage your health conditions as told by your doctor.  Learn to manage your stress.  Rest when you are tired.  If it is really hot outside: ? Avoid intense activities. ? Use air conditioning or fans, or get in a cooler place. ? Avoid caffeine and alcohol. ? Wear loose-fitting, lightweight, and light-colored clothing.  If it is really cold outside: ? Avoid intense activities. ? Layer your clothing. ? Wear mittens or gloves, a hat, and a scarf when going outside. ? Avoid alcohol.  Learn about heart failure and get support as needed.  Get help to maintain or improve your quality of life and your ability to care for yourself as needed. Contact a doctor if:  You gain weight quickly.  You are more short of breath than usual.  You cannot do your normal activities.  You tire easily.  You cough more  than normal, especially with activity.  You have any or more puffiness (swelling) in areas such as your hands, feet, ankles, or belly (abdomen).  You cannot sleep because it is hard to breathe.  You feel like your heart is beating fast (palpitations).  You get dizzy or light-headed when you stand up. Get help right away if:  You have trouble breathing.  There is a change in mental status, such as becoming less alert or not being able to focus.  You have chest pain or discomfort.  You faint. This information is not intended to replace advice given to you by your health care provider. Make  sure you discuss any questions you have with your health care provider. Document Released: 01/24/2008 Document Revised: 09/22/2015 Document Reviewed: 06/02/2012 Elsevier Interactive Patient Education  2017 [employer].

## 2017-10-04 NOTE — Progress Notes (Signed)
   CC: Heart failure with reduced ejection fraction follow-up  HPI:  Mr.Fitzroy S Reigner is a 62 y.o. male with a past medical history listed below here today for follow up of her HFrEF.  For details of today's visit and the status of her chronic medical issues please refer to the assessment and plan.   Past Medical History:  Diagnosis Date  . Allergic rhinitis   . Angioedema   . Bunion   . Diabetes mellitus   . History of hematuria 11/20/2007  . Hyperlipidemia   . Hypertension   . Hypokalemia   . Obesity   . OSA on CPAP   . Pericarditis 1988  . RBBB (right bundle branch block with left anterior fascicular block)   . Renal insufficiency    Cr 1.2 baseline  . Tobacco abuse    Stopped in 2002. Now smokes 1.5ppd  . Unspecified venous (peripheral) insufficiency    Review of Systems:   Chest pain or palpitations + Dyspnea with exertion  + Orthopnea  Physical Exam:  Vitals:   10/04/17 1006  BP: 124/86  Pulse: 86  Temp: 98.1 F (36.7 C)  TempSrc: Oral  Weight: (!) 336 lb 11.2 oz (152.7 kg)   General: alert, well-developed, and cooperative to examination.  Head: normocephalic and atraumatic.  Lungs: normal respiratory effort, no accessory muscle use, normal breath sounds, no crackles, and no wheezes. Heart: normal rate, regular rhythm, no murmur, no gallop, and no rub.  Unable to assess JVD due to body habitus. Abdomen: soft, non-tender, normal bowel sounds, distended abdomen Extremities: No cyanosis, clubbing, 3+ pitting edema bilaterally   Assessment & Plan:   See Encounters Tab for problem based charting.  Patient discussed with Dr. Rogelia Boga

## 2017-10-04 NOTE — Progress Notes (Signed)
Internal Medicine Clinic Attending  Case discussed with Dr. Boswell at the time of the visit.  We reviewed the resident's history and exam and pertinent patient test results.  I agree with the assessment, diagnosis, and plan of care documented in the resident's note.  

## 2017-10-04 NOTE — Assessment & Plan Note (Addendum)
Jeremiah Conway had 2D echo done on 09/30/2017.  It showed an EF of 30 to 35% with diffuse hypokinesis worst in the inferior wall.  Evidence of LVH.  Also notable for with her PA peak pressure of 49 mmHg.  He was started on 20 mg of Lasix daily on his visit on 09/17/2017.  He is already taking metoprolol 50 mg twice daily.  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  As such he is currently not on any ACE or ARB,  He was hypoxic with exertion into the 78's requiring 5 L of supplemental oxygen with ambulation.  He had no hypoxia with rest.  Today, he reports that he is to have 2-3 pillow orthopnea (previously only 2 pillows) as well as significant dyspnea with exertion.  He reports he can walk roughly 10 to 15 feet before becoming short of breath.  He does report that when he takes Lasix and urinates he feels much improved with his breathing as well as his belly distention.  Weight today is down 4 pounds; 340 to 336.   Assessment and plan: Patient remains volume overloaded on exam.  Now hypoxic at rest on room air to 83%.  This improved to 95-96% on 2 L.  We will increase his Lasix dose from 20 mg daily to 60 mg daily and he will follow-up on Monday for a recheck.    I will stop his other diuretics of triamterene-hydrochlorothiazide.  Rechecking BMP today.  Discussed fluid restriction and salt avoidance.  Given his reduced ejection fracture and hypokinesis he will need to be seen by cardiology for an ischemic evaluation and he does have numerous risk factors; referral placed today.  He has had issues with angioedema to ACE inhibitors in the past but given his heart failure some consideration for ARB therapy in the future.  Additionally, once he is more euvolemic we have room to increase his beta-blocker his heart rate today remains in the upper 80s.  Additionally he does have some evidence of right-sided heart failure.  He is set up for a new sleep study that is scheduled  for 2 weeks.

## 2017-10-05 LAB — BMP8+ANION GAP
Anion Gap: 14 mmol/L (ref 10.0–18.0)
BUN/Creatinine Ratio: 17 (ref 10–24)
BUN: 20 mg/dL (ref 8–27)
CALCIUM: 9.9 mg/dL (ref 8.6–10.2)
CHLORIDE: 99 mmol/L (ref 96–106)
CO2: 30 mmol/L — ABNORMAL HIGH (ref 20–29)
Creatinine, Ser: 1.21 mg/dL (ref 0.76–1.27)
GFR, EST AFRICAN AMERICAN: 74 mL/min/{1.73_m2} (ref >59)
GFR, EST NON AFRICAN AMERICAN: 64 mL/min/{1.73_m2} (ref >59)
Glucose: 138 mg/dL — ABNORMAL HIGH (ref 65–99)
Potassium: 4.1 mmol/L (ref 3.5–5.2)
Sodium: 143 mmol/L (ref 134–144)

## 2017-10-07 ENCOUNTER — Encounter: Payer: Self-pay | Admitting: Internal Medicine

## 2017-10-07 ENCOUNTER — Ambulatory Visit (INDEPENDENT_AMBULATORY_CARE_PROVIDER_SITE_OTHER): Payer: Medicaid Other | Admitting: Internal Medicine

## 2017-10-07 ENCOUNTER — Other Ambulatory Visit: Payer: Self-pay

## 2017-10-07 VITALS — BP 134/80 | HR 91 | Temp 99.6°F | Wt 338.1 lb

## 2017-10-07 DIAGNOSIS — I5020 Unspecified systolic (congestive) heart failure: Secondary | ICD-10-CM

## 2017-10-07 DIAGNOSIS — R0902 Hypoxemia: Secondary | ICD-10-CM | POA: Diagnosis not present

## 2017-10-07 DIAGNOSIS — I509 Heart failure, unspecified: Secondary | ICD-10-CM

## 2017-10-07 DIAGNOSIS — Z79899 Other long term (current) drug therapy: Secondary | ICD-10-CM | POA: Diagnosis not present

## 2017-10-07 MED ORDER — FUROSEMIDE 20 MG PO TABS: 60.0000 mg | ORAL_TABLET | Freq: Every day | ORAL | 0 refills | Status: DC

## 2017-10-07 NOTE — Assessment & Plan Note (Addendum)
Patient's weight 338 from 336 lbs two days ago. Subjectively, the patient states his SOB has improved on the higher 60 mg daily dose of lasix. Patient hypoxic at rest on RA to 85%. Saturating high 90s on 3 liters of supplemental oxygen. On exam, lungs CTAB and 2+ pitting edema of his LE bilaterally, no JVD appreciated. Patient still requires additional diuresis on 60 mg of lasix daily.   Plan: -BMP to monitor renal function and potassium with increased lasix dose -Continue 60 mg of lasix daily for additional 7 days -RTC in one week for follow up   Addendum 10/08/2017: Creatinine and BUN stable at 1.33, 15. Potassium also stable at 4.2. Will continue Lasix 60 mg daily until follow up as planned.

## 2017-10-07 NOTE — Patient Instructions (Addendum)
Mr. Jeremiah Conway,   I am happy you are feeling better.   Continue to take Lasix 60 mg daily for an additional 7 days then return to clinic in one week for follow up.   I will call you with your lab results.

## 2017-10-07 NOTE — Progress Notes (Signed)
   CC: CHF, hypervolemia   HPI:  Jeremiah Conway is a 62 y.o. male with a past medical history listed below here today for follow up of his chronic systolic heart failure. The patient was seen in clinic on 6/7 and was hypervolemic with SOB on exertion, LE edema, as well as orthopnea. His lasix dose was increased from 20 mg daily to 60 mg daily. Patient reports in improvement in his shortness of breath, LE swelling, and orthopnea. Endorses increased urine output with high lasix dose. Denies lightheadedness or dizziness.   For details of today's visit and the status of his chronic medical issues please refer to the assessment and plan.  Past Medical History:  Diagnosis Date  . Allergic rhinitis   . Angioedema   . Bunion   . Diabetes mellitus   . History of hematuria 11/20/2007  . Hyperlipidemia   . Hypertension   . Hypokalemia   . Obesity   . OSA on CPAP   . Pericarditis 1988  . RBBB (right bundle branch block with left anterior fascicular block)   . Renal insufficiency    Cr 1.2 baseline  . Tobacco abuse    Stopped in 2002. Now smokes 1.5ppd  . Unspecified venous (peripheral) insufficiency    Review of Systems:   Review of Systems  Constitutional: Negative for chills and fever.  Respiratory: Positive for shortness of breath (improving).   Cardiovascular: Positive for orthopnea and leg swelling (improving). Negative for chest pain.  Genitourinary: Positive for frequency (attributes to lasix).  Neurological: Negative for dizziness.    Physical Exam:  Vitals:   10/07/17 1451  BP: 134/80  Pulse: 91  Temp: 99.6 F (37.6 C)  TempSrc: Oral  SpO2: 96%  Weight: (!) 338 lb 1.6 oz (153.4 kg)   Physical Exam  Constitutional: He is oriented to person, place, and time. He appears well-developed and well-nourished. No distress.  HENT:  Head: Normocephalic and atraumatic.  Eyes: Conjunctivae are normal. No scleral icterus.  Neck: No JVD present.  Cardiovascular: Normal rate,  regular rhythm and normal heart sounds.  Pulmonary/Chest: Effort normal and breath sounds normal. He has no rales.  Abdominal: Soft. Bowel sounds are normal. He exhibits distension. There is no tenderness.  Musculoskeletal: He exhibits edema (2+ pitting edema bilateral lower extremities).  Neurological: He is alert and oriented to person, place, and time. No cranial nerve deficit.    Assessment & Plan:   See Encounters Tab for problem based charting.  Patient seen with Dr. Oswaldo Done

## 2017-10-08 LAB — BMP8+ANION GAP
Anion Gap: 14 mmol/L (ref 10.0–18.0)
BUN/Creatinine Ratio: 11 (ref 10–24)
BUN: 15 mg/dL (ref 8–27)
CO2: 31 mmol/L — ABNORMAL HIGH (ref 20–29)
Calcium: 9.4 mg/dL (ref 8.6–10.2)
Chloride: 99 mmol/L (ref 96–106)
Creatinine, Ser: 1.33 mg/dL — ABNORMAL HIGH (ref 0.76–1.27)
GFR calc Af Amer: 66 mL/min/1.73
GFR calc non Af Amer: 57 mL/min/1.73 — ABNORMAL LOW
Glucose: 127 mg/dL — ABNORMAL HIGH (ref 65–99)
Potassium: 4.2 mmol/L (ref 3.5–5.2)
Sodium: 144 mmol/L (ref 134–144)

## 2017-10-08 NOTE — Progress Notes (Signed)
Internal Medicine Clinic Attending  I saw and evaluated the patient.  I personally confirmed the key portions of the history and exam documented by Dr. Minda Meo and I reviewed pertinent patient test results.  The assessment, diagnosis, and plan were formulated together and I agree with the documentation in the resident's note.  Mild acute HFrEF with volume overload seems to be slowly improving with lasix. Cardiology referral for ischemic evaluation is pending. Labs are stable. I agree with follow up in one week for further medication titration.

## 2017-10-14 ENCOUNTER — Ambulatory Visit (INDEPENDENT_AMBULATORY_CARE_PROVIDER_SITE_OTHER): Payer: Medicaid Other | Admitting: Internal Medicine

## 2017-10-14 ENCOUNTER — Other Ambulatory Visit: Payer: Self-pay

## 2017-10-14 ENCOUNTER — Encounter (INDEPENDENT_AMBULATORY_CARE_PROVIDER_SITE_OTHER): Payer: Self-pay

## 2017-10-14 ENCOUNTER — Other Ambulatory Visit: Payer: Self-pay | Admitting: Internal Medicine

## 2017-10-14 DIAGNOSIS — I509 Heart failure, unspecified: Secondary | ICD-10-CM

## 2017-10-14 DIAGNOSIS — I5023 Acute on chronic systolic (congestive) heart failure: Secondary | ICD-10-CM | POA: Diagnosis present

## 2017-10-14 DIAGNOSIS — Z79899 Other long term (current) drug therapy: Secondary | ICD-10-CM

## 2017-10-14 DIAGNOSIS — R0601 Orthopnea: Secondary | ICD-10-CM | POA: Diagnosis not present

## 2017-10-14 DIAGNOSIS — R0600 Dyspnea, unspecified: Secondary | ICD-10-CM

## 2017-10-14 MED ORDER — FUROSEMIDE 40 MG PO TABS: 120.0000 mg | ORAL_TABLET | Freq: Every day | ORAL | 2 refills | Status: DC

## 2017-10-14 NOTE — Progress Notes (Signed)
   CC: Acute systolic congestive heart failure exacerbation   HPI:  [name] is a 62 y.o. male with a past medical history listed below here today for follow up of acute systolic congestive heart failure exacerbation.   For details of today's visit and the status of his acute and chronic medical issues please refer to the assessment and plan.  Past Medical History:  Diagnosis Date  . Allergic rhinitis   . Angioedema   . Bunion   . Diabetes mellitus   . History of hematuria 11/20/2007  . Hyperlipidemia   . Hypertension   . Hypokalemia   . Obesity   . OSA on CPAP   . Pericarditis 1988  . RBBB (right bundle branch block with left anterior fascicular block)   . Renal insufficiency    Cr 1.2 baseline  . Tobacco abuse    Stopped in 2002. Now smokes 1.5ppd  . Unspecified venous (peripheral) insufficiency    Review of Systems:   Review of Systems  Constitutional: Negative for chills and fever.  Respiratory: Positive for shortness of breath. Negative for cough.   Cardiovascular: Positive for orthopnea, leg swelling and PND. Negative for chest pain.  Neurological: Negative for dizziness.    Physical Exam:  Vitals:   10/14/17 1433  BP: (!) 144/88  Pulse: 95  Temp: 99 F (37.2 C)  TempSrc: Oral  SpO2: 97%  Weight: (!) 344 lb 1.6 oz (156.1 kg)  Height: 5\' 10"  (1.778 m)   General: Sitting comfortably in wheelchair, NAD HEENT: [place]/AT, EOMI, no scleral icterus, no JVD Cardiac: RRR, No R/M/G appreciated Pulm: normal effort, CTAB Abd: soft, distended, non tender, BS normal Ext: extremities well perfused, 2+ bilateral lower extremity peripheral edema Neuro: alert and oriented X3, cranial nerves II-XII grossly intact   Assessment & Plan:   See Encounters Tab for problem based charting.  Patient discussed with Dr. Rogelia Boga

## 2017-10-14 NOTE — Assessment & Plan Note (Signed)
Patient presenting for follow-up of mild acute systolic heart failure exacerbation.  Will follow the patient closely.  He was last seen in clinic 6 days ago.  At that time patient's weight was 338 pounds today his weight has increased to 344 pounds.  He reports overall compliance with 60 mg daily of Lasix.  States he forgot to take Lasix yesterday, but did take it this morning. Reports good UOP. He states he has been waking up in the middle of the night with the sensation of "drowning and gasping air."  He also reports 3 pillow orthopnea.  The patient also reports drinking a lot of fluids including coffee and water throughout the day.  On exam the patient's lungs are clear to auscultation bilaterally, no JVD, lower extremity peripheral edema unchanged(2+ pitting).  Creatinine and potassium have been stable. With persistent weight gain despite diuretics will need to increase daily Lasix dose and fluid restrict.   Plan: -Increase Lasix from 60 mg daily to 120 mg daily -Discussed fluid restriction of no more than 2 L daily -Stopping amlodipine  -RTC in 3 days for BMP and weight check -Patient is scheduled to see Cardiology on 12/04/2017 -Defer sleep study (scheduled for June 21) as patient is experiencing paroxysmal nocturnal dyspnea and orthopnea

## 2017-10-14 NOTE — Patient Instructions (Addendum)
Jeremiah Conway,   I have increased your lasix dose. Take 120 mg daily for an additional 3 days. I have prescribed you 40 mg tablets. Take THREE 40 mg tablets daily.   Return to clinic in 3 days for weight check and labs.   I agree with canceling your sleep study for now.   STOP taking your amlodipine for now.   Try to not drink more than 2 liters of fluids a day. This includes water, coffee, and soda.   2 Liters of fluid is equivalent to 68 fluid ounces. Please keep track of the amount your drinking.

## 2017-10-15 NOTE — Progress Notes (Signed)
Internal Medicine Clinic Attending  Case discussed with Dr. Minda Meo at the time of the visit.  We reviewed the resident's history and exam and pertinent patient test results.  I agree with the assessment, diagnosis, and plan of care documented in the resident's note. Agree sleep study will not be accurate if pt is vol overloaded. Address sleep study, maybe home study, once euvolemic.

## 2017-10-17 ENCOUNTER — Other Ambulatory Visit: Payer: Self-pay

## 2017-10-17 ENCOUNTER — Encounter (INDEPENDENT_AMBULATORY_CARE_PROVIDER_SITE_OTHER): Payer: Self-pay

## 2017-10-17 ENCOUNTER — Encounter: Payer: Self-pay | Admitting: Internal Medicine

## 2017-10-17 ENCOUNTER — Ambulatory Visit: Payer: Medicaid Other | Admitting: Internal Medicine

## 2017-10-17 VITALS — BP 137/89 | HR 87 | Temp 99.0°F | Ht 70.0 in | Wt 335.6 lb

## 2017-10-17 DIAGNOSIS — Z9981 Dependence on supplemental oxygen: Secondary | ICD-10-CM | POA: Diagnosis not present

## 2017-10-17 DIAGNOSIS — I509 Heart failure, unspecified: Secondary | ICD-10-CM | POA: Diagnosis present

## 2017-10-17 DIAGNOSIS — Z79899 Other long term (current) drug therapy: Secondary | ICD-10-CM | POA: Diagnosis not present

## 2017-10-17 DIAGNOSIS — I5020 Unspecified systolic (congestive) heart failure: Secondary | ICD-10-CM

## 2017-10-17 MED ORDER — FUROSEMIDE 40 MG PO TABS: 80.0000 mg | ORAL_TABLET | Freq: Every day | ORAL | 2 refills | Status: DC

## 2017-10-17 NOTE — Progress Notes (Signed)
   CC: HFrEF follow up  HPI:  [name] is a 62 y.o. male with a past medical history listed below here today for follow up of his HFrEF.   For details of today's visit and the status of his chronic medical issues please refer to the assessment and plan.   Past Medical History:  Diagnosis Date  . Allergic rhinitis   . Angioedema   . Bunion   . Diabetes mellitus   . History of hematuria 11/20/2007  . Hyperlipidemia   . Hypertension   . Hypokalemia   . Obesity   . OSA on CPAP   . Pericarditis 1988  . RBBB (right bundle branch block with left anterior fascicular block)   . Renal insufficiency    Cr 1.2 baseline  . Tobacco abuse    Stopped in 2002. Now smokes 1.5ppd  . Unspecified venous (peripheral) insufficiency    Review of Systems:   No chest pain +DOE + Orthopena  Physical Exam:  Vitals:   10/17/17 1541  BP: 137/89  Pulse: 87  Temp: 99 F (37.2 C)  TempSrc: Oral  SpO2: 97%  Weight: (!) 335 lb 9.6 oz (152.2 kg)  Height: 5\' 10"  (1.778 m)   GENERAL- alert, co-operative, appears as stated age, not in any distress. CARDIAC- RRR, no murmurs, rubs or gallops. RESP-Clear to auscultation bilaterally, no wheezes or crackles. ABDOMEN- Soft, nontender, bowel sounds present. EXTREMITIES- pulse 2+, symmetric, 2+ pitting edema bilaterally SKIN- Warm, dry, No rash or lesion. PSYCH- Normal mood and affect, appropriate thought content and speech.  Assessment & Plan:   See Encounters Tab for problem based charting.  Patient discussed with Dr. Cyndie Chime

## 2017-10-17 NOTE — Patient Instructions (Addendum)
Jeremiah Conway,  I am going to decrease the Lasix dose to 80 mg daily so take 2 of those 40 mg pills every day.  I would like for you to check your weight daily at home.  If you start to notice that your weight is going up or you are having more symptoms such as the shortness of breath or worsening leg swelling please give the clinic a call.  Please keep an eye on your fluid intake as well I would like for her to be around 2 L or less every day.  Please come back and see [place] next week for follow-up.  As long as you are feeling okay it is okay for you to drive.

## 2017-10-18 ENCOUNTER — Encounter (HOSPITAL_BASED_OUTPATIENT_CLINIC_OR_DEPARTMENT_OTHER): Payer: Medicaid Other

## 2017-10-18 LAB — BMP8+ANION GAP
Anion Gap: 16 mmol/L (ref 10.0–18.0)
BUN / CREAT RATIO: 12 (ref 10–24)
BUN: 19 mg/dL (ref 8–27)
CO2: 31 mmol/L — AB (ref 20–29)
CREATININE: 1.54 mg/dL — AB (ref 0.76–1.27)
Calcium: 9.5 mg/dL (ref 8.6–10.2)
Chloride: 97 mmol/L (ref 96–106)
GFR calc non Af Amer: 48 mL/min/{1.73_m2} — ABNORMAL LOW (ref >59)
GFR, EST AFRICAN AMERICAN: 55 mL/min/{1.73_m2} — AB (ref >59)
Glucose: 122 mg/dL — ABNORMAL HIGH (ref 65–99)
Potassium: 3.7 mmol/L (ref 3.5–5.2)
Sodium: 144 mmol/L (ref 134–144)

## 2017-10-21 NOTE — Assessment & Plan Note (Signed)
He returns today for follow-up of his heart failure.  He was seen in clinic last week and had his Lasix increased from 60 mg daily to 120 mg daily.  He reports that since increasing the dose of his Lasix he has felt better.  He reports his abdomen feels less distended and his shortness of breath is improved but has not completely resolved.  He reports he is unsure what his normal weight was before he was diagnosed with heart failure.  He reports he does continue to have orthopnea but this is also improving.  His weight today has improved from 344 pounds to 335 pounds.  He does continue to have 2+ pitting edema bilaterally.  He remains on oxygen.  He was on 3 L upon arrival.  After putting him on room air he desaturated to 87% at rest.  This improved greater than 90 on 2 L.  Assessment and plan: BMP today shows a bump in her creatinine. We will decrease his Lasix from 120 to 80 mg daily.  We discussed fluid restriction again today.  It is difficult to know what his true dry right will be but he still has some ways to go.  We will follow-up next week for recheck.

## 2017-10-21 NOTE — Progress Notes (Signed)
Medicine attending: Medical history, presenting problems, physical findings, and medications, reviewed with resident physician Dr Nathan Boswell on the day of the patient visit and I concur with his evaluation and management plan. 

## 2017-10-24 ENCOUNTER — Ambulatory Visit: Payer: Medicaid Other | Admitting: Internal Medicine

## 2017-10-24 ENCOUNTER — Encounter: Payer: Self-pay | Admitting: Internal Medicine

## 2017-10-24 ENCOUNTER — Other Ambulatory Visit: Payer: Self-pay

## 2017-10-24 VITALS — BP 155/95 | HR 94 | Temp 99.2°F | Ht 70.0 in | Wt 338.1 lb

## 2017-10-24 DIAGNOSIS — Z79899 Other long term (current) drug therapy: Secondary | ICD-10-CM | POA: Diagnosis not present

## 2017-10-24 DIAGNOSIS — I5020 Unspecified systolic (congestive) heart failure: Secondary | ICD-10-CM

## 2017-10-24 DIAGNOSIS — Z9981 Dependence on supplemental oxygen: Secondary | ICD-10-CM

## 2017-10-24 DIAGNOSIS — I110 Hypertensive heart disease with heart failure: Secondary | ICD-10-CM

## 2017-10-24 DIAGNOSIS — Z6841 Body Mass Index (BMI) 40.0 and over, adult: Secondary | ICD-10-CM

## 2017-10-24 DIAGNOSIS — E119 Type 2 diabetes mellitus without complications: Secondary | ICD-10-CM | POA: Diagnosis not present

## 2017-10-24 DIAGNOSIS — E662 Morbid (severe) obesity with alveolar hypoventilation: Secondary | ICD-10-CM

## 2017-10-24 DIAGNOSIS — R0902 Hypoxemia: Secondary | ICD-10-CM

## 2017-10-24 MED ORDER — METOPROLOL TARTRATE 75 MG PO TABS: 75.0000 mg | ORAL_TABLET | Freq: Two times a day (BID) | ORAL | 1 refills | Status: DC

## 2017-10-24 NOTE — Assessment & Plan Note (Signed)
Patient is currently requiring supplemental oxygen wearing 2 L throughout most of the day via nasal cannula.  His oxygen on arrival on 2 L was 93%.  Patient states that he will trial room air at home which previously would cause him to feel short of breath quickly, however he says now he sometimes does not even notice that he is off of supplemental oxygen and has to remind himself to place himself back on it. A/P: Patient's hypoxemia is likely secondary to OHS/OSA with contribution from his congestive heart failure.  He is requiring less supplemental oxygen now that he is being treated for CHF with diuretics.  Oxygen saturations in clinic today: 88% on RA at rest, decreases to 84% when talking  79-84% on RA when ambulating; 88-89% on 2 L (drops lower when taking shallow breaths) -Continue supplemental O2 2L at rest and with ambulation -Continue management of his HFrEF -Patient advised to reschedule split-night study

## 2017-10-24 NOTE — Assessment & Plan Note (Addendum)
Patient returns for follow-up management of his heart failure.  He was seen in clinic last week at which time he was taking Lasix 120 mg daily (increased from 60 mg daily).  He was symptomatically doing well and his weight was 335 pounds.  Lab work showed an increase in his creatinine from 1.3-1.5, therefore his Lasix was reduced to 80 mg daily.  He is also taking metoprolol 50 mg twice a day.    Patient has been wearing supplemental oxygen 2 L throughout most of the day.  He says he will try to see how he does off of oxygen and says he has been doing well compared to prior when he would become short of breath off of oxygen.  He says he is not having any dyspnea at rest, orthopnea (uses 3 pillows at night), PND, chest pain, palpitations.  Reports getting short of breath walking to the mailbox which is about 40 to 50 feet.  He reports improvement in abdominal distention.  He reports good urine output with his current Lasix dose.  His weight today is 338 pounds compared to 335 pounds last week.  He does not have a scale of the plans to obtain one.  He is attempting to limit fluids but does admit to salt in his diet containing processed foods.  A/P: Patient heart failure with reduced ejection fraction of 30-35%.  Symptomatically he is improving and doing well on his current dose of Lasix 80 mg daily.  I will continue his current Lasix dose and recheck a BMP to assess his renal function.  He has been tolerating metoprolol 50 mg twice a day well.  Will increase to 75 mg twice a day.  If tolerating this increase well, we can increase further and change to the 200 mg metoprolol succinate once daily formulation.  He does admit to eating high salt foods and I counseled him on limiting his salt and fluid intake. -Continue Lasix 80 mg daily -Increase metoprolol to 75 mg twice a day -Check BMP -Limit salt <2000 mg, limit fluids <2000 mL - obtain scale to check weights at home - f/u here in ~ 4 weeks or sooner  prn - f/u with Cardiology 12/04/17 as scheduled

## 2017-10-24 NOTE — Patient Instructions (Signed)
It was a pleasure to see you Jeremiah Conway.  I am glad you are feeling better.  We are checking blood work today to check the kidney numbers.  Please continue your Lasix 80 mg daily.  We are increasing your metoprolol to 75 mg twice a day.  Please limit salt to 2 g (2000 mg) or less per day.  Please limit fluid to 2 L (2000 mL) or less per day.  Please follow-up with [place] again in about 4 weeks or sooner if needed.

## 2017-10-24 NOTE — Progress Notes (Signed)
CC: CHF  HPI:  [name] is a 62 y.o. male with PMH as listed below including type 2 diabetes, hypertension, HFrEF (EF 30-35%), OSA, and obesity who presents for follow-up management of his HFrEF.  Please see problem based charting for status of patient's chronic medical issues.  Heart failure with reduced ejection fraction [facility]) Patient returns for follow-up management of his heart failure.  He was seen in clinic last week at which time he was taking Lasix 120 mg daily (increased from 60 mg daily).  He was symptomatically doing well and his weight was 335 pounds.  Lab work showed an increase in his creatinine from 1.3-1.5, therefore his Lasix was reduced to 80 mg daily.  He is also taking metoprolol 50 mg twice a day.    Patient has been wearing supplemental oxygen 2 L throughout most of the day.  He says he will try to see how he does off of oxygen and says he has been doing well compared to prior when he would become short of breath off of oxygen.  He says he is not having any dyspnea at rest, orthopnea (uses 3 pillows at night), PND, chest pain, palpitations.  Reports getting short of breath walking to the mailbox which is about 40 to 50 feet.  He reports improvement in abdominal distention.  He reports good urine output with his current Lasix dose.  His weight today is 338 pounds compared to 335 pounds last week.  He does not have a scale of the plans to obtain one.  He is attempting to limit fluids but does admit to salt in his diet containing processed foods.  A/P: Patient heart failure with reduced ejection fraction of 30-35%.  Symptomatically he is improving and doing well on his current dose of Lasix 80 mg daily.  I will continue his current Lasix dose and recheck a BMP to assess his renal function.  He has been tolerating metoprolol 50 mg twice a day well.  Will increase to 75 mg twice a day.  If tolerating this increase well, we can increase further and change to the 200 mg  metoprolol succinate once daily formulation.  He does admit to eating high salt foods and I counseled him on limiting his salt and fluid intake. -Continue Lasix 80 mg daily -Increase metoprolol to 75 mg twice a day -Check BMP -Limit salt <2000 mg, limit fluids <2000 mL - obtain scale to check weights at home - f/u here in ~ 4 weeks or sooner prn - f/u with Cardiology 12/04/17 as scheduled  Hypoxemia requiring supplemental oxygen Patient is currently requiring supplemental oxygen wearing 2 L throughout most of the day via nasal cannula.  His oxygen on arrival on 2 L was 93%.  Patient states that he will trial room air at home which previously would cause him to feel short of breath quickly, however he says now he sometimes does not even notice that he is off of supplemental oxygen and has to remind himself to place himself back on it. A/P: Patient's hypoxemia is likely secondary to OHS/OSA with contribution from his congestive heart failure.  He is requiring less supplemental oxygen now that he is being treated for CHF with diuretics.  Oxygen saturations in clinic today: 88% on RA at rest, decreases to 84% when talking  79-84% on RA when ambulating; 88-89% on 2 L (drops lower when taking shallow breaths) -Continue supplemental O2 2L at rest and with ambulation -Continue management of his HFrEF -  Patient advised to reschedule split-night study    Past Medical History:  Diagnosis Date  . Allergic rhinitis   . Angioedema   . Bunion   . Diabetes mellitus   . History of hematuria 11/20/2007  . Hyperlipidemia   . Hypertension   . Hypokalemia   . Obesity   . OSA on CPAP   . Pericarditis 1988  . RBBB (right bundle branch block with left anterior fascicular block)   . Renal insufficiency    Cr 1.2 baseline  . Tobacco abuse    Stopped in 2002. Now smokes 1.5ppd  . Unspecified venous (peripheral) insufficiency    Review of Systems:   Review of Systems  Respiratory:       No dyspnea at  rest, dyspnea on exertion  Cardiovascular: Positive for leg swelling. Negative for chest pain, palpitations, orthopnea and PND.  Gastrointestinal: Negative for abdominal pain.  Musculoskeletal: Negative for falls.  Neurological: Negative for loss of consciousness.     Physical Exam:  Vitals:   10/24/17 1357  BP: (!) 155/95  Pulse: 94  Temp: 99.2 F (37.3 C)  TempSrc: Oral  SpO2: 93%  Weight: (!) 338 lb 1.6 oz (153.4 kg)  Height: 5\' 10"  (1.778 m)   Physical Exam  Constitutional: He is oriented to person, place, and time. He appears well-developed and well-nourished. No distress.  Obese man resting in wheelchair wearing 2L supplemental O2 via [place]  HENT:  Head: Normocephalic and atraumatic.  Cardiovascular: Normal rate and regular rhythm.  Pulmonary/Chest: Effort normal. No respiratory distress. He has no wheezes. He has no rales.  Abdominal: Soft. He exhibits no distension.  Musculoskeletal:  1-2+ edema both legs  Neurological: He is alert and oriented to person, place, and time.  Skin: Skin is warm. He is not diaphoretic.     Assessment & Plan:   See Encounters Tab for problem based charting.  Patient discussed with Dr. Cleda Daub

## 2017-10-25 LAB — BMP8+ANION GAP
ANION GAP: 16 mmol/L (ref 10.0–18.0)
BUN / CREAT RATIO: 12 (ref 10–24)
BUN: 16 mg/dL (ref 8–27)
CHLORIDE: 100 mmol/L (ref 96–106)
CO2: 30 mmol/L — AB (ref 20–29)
CREATININE: 1.35 mg/dL — AB (ref 0.76–1.27)
Calcium: 9.5 mg/dL (ref 8.6–10.2)
GFR calc Af Amer: 65 mL/min/{1.73_m2} (ref >59)
GFR calc non Af Amer: 56 mL/min/{1.73_m2} — ABNORMAL LOW (ref >59)
GLUCOSE: 109 mg/dL — AB (ref 65–99)
POTASSIUM: 3.7 mmol/L (ref 3.5–5.2)
SODIUM: 146 mmol/L — AB (ref 134–144)

## 2017-10-26 ENCOUNTER — Encounter: Payer: Self-pay | Admitting: *Deleted

## 2017-10-28 NOTE — Progress Notes (Signed)
Internal Medicine Clinic Attending  Case discussed with Dr. Patel at the time of the visit.  We reviewed the resident's history and exam and pertinent patient test results.  I agree with the assessment, diagnosis, and plan of care documented in the resident's note.  

## 2017-11-10 ENCOUNTER — Other Ambulatory Visit: Payer: Self-pay | Admitting: Internal Medicine

## 2017-11-21 ENCOUNTER — Encounter: Payer: Self-pay | Admitting: Internal Medicine

## 2017-11-21 ENCOUNTER — Telehealth: Payer: Self-pay | Admitting: Internal Medicine

## 2017-11-21 ENCOUNTER — Other Ambulatory Visit: Payer: Self-pay

## 2017-11-21 ENCOUNTER — Ambulatory Visit: Payer: Medicaid Other | Admitting: Internal Medicine

## 2017-11-21 VITALS — BP 134/86 | HR 87 | Temp 98.2°F | Ht 70.0 in | Wt 354.6 lb

## 2017-11-21 DIAGNOSIS — Z888 Allergy status to other drugs, medicaments and biological substances status: Secondary | ICD-10-CM | POA: Diagnosis not present

## 2017-11-21 DIAGNOSIS — E669 Obesity, unspecified: Secondary | ICD-10-CM

## 2017-11-21 DIAGNOSIS — J449 Chronic obstructive pulmonary disease, unspecified: Secondary | ICD-10-CM | POA: Diagnosis not present

## 2017-11-21 DIAGNOSIS — R0601 Orthopnea: Secondary | ICD-10-CM

## 2017-11-21 DIAGNOSIS — I5020 Unspecified systolic (congestive) heart failure: Secondary | ICD-10-CM

## 2017-11-21 DIAGNOSIS — E1129 Type 2 diabetes mellitus with other diabetic kidney complication: Secondary | ICD-10-CM

## 2017-11-21 DIAGNOSIS — I110 Hypertensive heart disease with heart failure: Secondary | ICD-10-CM | POA: Diagnosis not present

## 2017-11-21 DIAGNOSIS — N289 Disorder of kidney and ureter, unspecified: Secondary | ICD-10-CM | POA: Diagnosis not present

## 2017-11-21 DIAGNOSIS — G4733 Obstructive sleep apnea (adult) (pediatric): Secondary | ICD-10-CM

## 2017-11-21 DIAGNOSIS — Z7984 Long term (current) use of oral hypoglycemic drugs: Secondary | ICD-10-CM | POA: Diagnosis not present

## 2017-11-21 DIAGNOSIS — Z6841 Body Mass Index (BMI) 40.0 and over, adult: Secondary | ICD-10-CM | POA: Diagnosis not present

## 2017-11-21 DIAGNOSIS — Z9981 Dependence on supplemental oxygen: Secondary | ICD-10-CM | POA: Diagnosis not present

## 2017-11-21 DIAGNOSIS — Z79899 Other long term (current) drug therapy: Secondary | ICD-10-CM | POA: Diagnosis not present

## 2017-11-21 LAB — POCT GLYCOSYLATED HEMOGLOBIN (HGB A1C): HEMOGLOBIN A1C: 6.1 % — AB (ref 4.0–5.6)

## 2017-11-21 LAB — GLUCOSE, CAPILLARY: Glucose-Capillary: 119 mg/dL — ABNORMAL HIGH (ref 70–99)

## 2017-11-21 MED ORDER — METOPROLOL SUCCINATE ER 200 MG PO TB24: 200.0000 mg | ORAL_TABLET | Freq: Every day | ORAL | 3 refills | Status: DC

## 2017-11-21 MED ORDER — SPIRONOLACTONE 25 MG PO TABS: 25.0000 mg | ORAL_TABLET | Freq: Every day | ORAL | 3 refills | Status: DC

## 2017-11-21 NOTE — Assessment & Plan Note (Addendum)
Current medications include Lasix 80 mg daily, metoprolol 75 mg twice daily.  He is requiring 3 L O2 which he wears all the time.  This is increased from previous 2 L.  While showering, he has to hang the oxygen over the door because he needs it intermittently. He endorses dyspnea on exertion that is stable.  Denies shortness of breath at rest, chest pain, palpitations, orthopnea (3 pillows at night).  Did admit to missing his morning dose of medications yesterday and notes increased lower extremity edema today.  He is tolerating metoprolol 75 mg twice daily with a heart rate of 87 today.  His weight is up 15 pounds from last month.  Unclear what his dry weight is as he has never been effectively diuresed.  On exam cardiac rhythm is normal, lungs are CTAB, and he has 1-2+ pitting edema in BLE up to mid calf  Plan:  -Increase metoprolol to 200 mg daily -Start spironolactone 25 mg daily -Discussed increasing Lasix but prior increases led to bump in creatinine.  Hopefully spironolactone will help with diuresis -Discussed adding ARB but patient is allergic to ACE inhibitors with angioedema so ARBs are contraindicated. -Continue Lasix 80 mg daily -Cardiology appointment August 7.  May be a good candidate for cardiac rehab -Follow-up in 1 week (7/31), repeat BMP

## 2017-11-21 NOTE — Patient Instructions (Addendum)
It was nice seeing you today. Thank you for choosing [facility] for your Primary Care.   Start Spironolactone 25mg  daily and come back in a week to get your kidney labs checked.  Pick up metoprolol 200mg  from the pharmacy and take this daily. Stop taking your smaller dose of metoprolol  Continue Lasix 80mg  daily    FOLLOW-UP INSTRUCTIONS When: 7/31 f/u with Dr. Petra Kuba  For: heart failure What to bring: all medications  Please contact the clinic if you have any problems, or need to be seen sooner.

## 2017-11-21 NOTE — Progress Notes (Signed)
   CC: Heart failure, shortness of breath  HPI:  [name] is a 62 y.o. male with type 2 diabetes, hypertension, heart failure with reduced ejection fraction (30 to 35%), obstructive sleep apnea, obesity who presents today for management of chronic conditions.  Please see encounter based charting for full details of HPI  Past Medical History:  Diagnosis Date  . Allergic rhinitis   . Angioedema   . Bunion   . Diabetes mellitus   . History of hematuria 11/20/2007  . Hyperlipidemia   . Hypertension   . Hypokalemia   . Obesity   . OSA on CPAP   . Pericarditis 1988  . RBBB (right bundle branch block with left anterior fascicular block)   . Renal insufficiency    Cr 1.2 baseline  . Tobacco abuse    Stopped in 2002. Now smokes 1.5ppd  . Unspecified venous (peripheral) insufficiency    Review of Systems:   A complete ROS was negative except as per HPI.  General: Denies fever, chills Respiratory: +DOE Denies cough, chest tightness, and wheezing.   Cardiovascular: Denies chest pain and palpitations.  Extremities: 1-2+ pitting edema in BLE up to mid calf   Physical Exam:  Vitals:   11/21/17 1321  BP: 134/86  Pulse: 87  Temp: 98.2 F (36.8 C)  TempSrc: Oral  SpO2: 98%  Weight: (!) 354 lb 9.6 oz (160.8 kg)  Height: 5\' 10"  (1.778 m)   Gen: Well appearing, NAD, wearing [place] O2 CV: RRR, no murmurs Pulm: Normal effort, CTA throughout, no wheezing   Assessment & Plan:   See Encounters Tab for problem based charting.  Patient seen with Dr. Criselda Peaches

## 2017-11-21 NOTE — Telephone Encounter (Signed)
Metoprolol XL 200 mg and spironolactone 25 mg will need to be resent by attending as prescribing provider not medicaid certified at this time. Of note there are 2 different doses of metoprolol on patient's med list. Kinnie Feil, RN, BSN  .

## 2017-11-21 NOTE — Telephone Encounter (Signed)
Pt called- asked pharmacy for clinic to call because Dr Petra Kuba isn't Medicaid yet, pls call pharmacy [facility]; pt contact # [phone]

## 2017-11-21 NOTE — Telephone Encounter (Signed)
Sent anew.  And discontinued the previous metoprolol dose.  Thanks!

## 2017-11-22 DIAGNOSIS — J449 Chronic obstructive pulmonary disease, unspecified: Secondary | ICD-10-CM | POA: Insufficient documentation

## 2017-11-22 LAB — BMP8+ANION GAP
ANION GAP: 13 mmol/L (ref 10.0–18.0)
BUN/Creatinine Ratio: 13 (ref 10–24)
BUN: 20 mg/dL (ref 8–27)
CALCIUM: 9.5 mg/dL (ref 8.6–10.2)
CHLORIDE: 102 mmol/L (ref 96–106)
CO2: 30 mmol/L — AB (ref 20–29)
CREATININE: 1.6 mg/dL — AB (ref 0.76–1.27)
GFR calc Af Amer: 53 mL/min/{1.73_m2} — ABNORMAL LOW (ref >59)
GFR calc non Af Amer: 46 mL/min/{1.73_m2} — ABNORMAL LOW (ref >59)
Glucose: 118 mg/dL — ABNORMAL HIGH (ref 65–99)
Potassium: 3.9 mmol/L (ref 3.5–5.2)
SODIUM: 145 mmol/L — AB (ref 134–144)

## 2017-11-22 NOTE — Assessment & Plan Note (Signed)
A1c 6.1 today.  At goal.  Denies symptoms of hypoglycemia.  Continue metformin 1000 mg twice daily

## 2017-11-22 NOTE — Assessment & Plan Note (Signed)
FEV1 1.62 FEV1/FVC 73 Indicative of obstructive airway disease. Discussed treating with Spiriva but patient does not feel like his sob is that bad (more so DOE) and does not want to become dependent on an inhaler.   Plan: - Would likely benefit from Spiriva but will defer for now - Has cards appt 8/7 and may be good candidate for cardiac rehab which would also help his breathing

## 2017-11-27 ENCOUNTER — Ambulatory Visit: Payer: Medicaid Other | Admitting: Internal Medicine

## 2017-11-27 ENCOUNTER — Other Ambulatory Visit: Payer: Self-pay

## 2017-11-27 ENCOUNTER — Ambulatory Visit (HOSPITAL_COMMUNITY)
Admission: RE | Admit: 2017-11-27 | Discharge: 2017-11-27 | Disposition: A | Discharge status: Home / Self Care | Payer: Medicaid Other | Source: Ambulatory Visit | Attending: Oncology | Admitting: Oncology

## 2017-11-27 VITALS — BP 133/87 | HR 91 | Temp 99.6°F | Ht 70.0 in | Wt 361.2 lb

## 2017-11-27 DIAGNOSIS — R0602 Shortness of breath: Secondary | ICD-10-CM | POA: Diagnosis present

## 2017-11-27 DIAGNOSIS — I130 Hypertensive heart and chronic kidney disease with heart failure and stage 1 through stage 4 chronic kidney disease, or unspecified chronic kidney disease: Secondary | ICD-10-CM

## 2017-11-27 DIAGNOSIS — Z6841 Body Mass Index (BMI) 40.0 and over, adult: Secondary | ICD-10-CM | POA: Diagnosis not present

## 2017-11-27 DIAGNOSIS — R9431 Abnormal electrocardiogram [ECG] [EKG]: Secondary | ICD-10-CM | POA: Diagnosis not present

## 2017-11-27 DIAGNOSIS — J90 Pleural effusion, not elsewhere classified: Secondary | ICD-10-CM | POA: Diagnosis not present

## 2017-11-27 DIAGNOSIS — N183 Chronic kidney disease, stage 3 (moderate): Secondary | ICD-10-CM

## 2017-11-27 DIAGNOSIS — Q2546 Tortuous aortic arch: Secondary | ICD-10-CM | POA: Diagnosis not present

## 2017-11-27 DIAGNOSIS — I493 Ventricular premature depolarization: Secondary | ICD-10-CM | POA: Diagnosis not present

## 2017-11-27 DIAGNOSIS — E669 Obesity, unspecified: Secondary | ICD-10-CM

## 2017-11-27 DIAGNOSIS — I4510 Unspecified right bundle-branch block: Secondary | ICD-10-CM | POA: Diagnosis not present

## 2017-11-27 DIAGNOSIS — E1122 Type 2 diabetes mellitus with diabetic chronic kidney disease: Secondary | ICD-10-CM

## 2017-11-27 DIAGNOSIS — G4733 Obstructive sleep apnea (adult) (pediatric): Secondary | ICD-10-CM

## 2017-11-27 DIAGNOSIS — Z79899 Other long term (current) drug therapy: Secondary | ICD-10-CM

## 2017-11-27 DIAGNOSIS — I5020 Unspecified systolic (congestive) heart failure: Secondary | ICD-10-CM | POA: Insufficient documentation

## 2017-11-27 DIAGNOSIS — I5022 Chronic systolic (congestive) heart failure: Secondary | ICD-10-CM | POA: Insufficient documentation

## 2017-11-27 DIAGNOSIS — Z9981 Dependence on supplemental oxygen: Secondary | ICD-10-CM

## 2017-11-27 LAB — BRAIN NATRIURETIC PEPTIDE: B Natriuretic Peptide: 1824.1 pg/mL — ABNORMAL HIGH (ref 0.0–100.0)

## 2017-11-27 MED ORDER — TORSEMIDE 10 MG PO TABS: 20.0000 mg | ORAL_TABLET | Freq: Every day | ORAL | 2 refills | Status: DC

## 2017-11-27 NOTE — Progress Notes (Signed)
Medicine attending: I personally interviewed and briefly examined this patient on the day of the patient visit and reviewed pertinent clinical ,laboratory, and radiographic data  with resident physician Dr. Maryelizabeth Kaufmann and we discussed a management plan. 62 y/o AA man w OSA, not using CPAP, O2 dependent, recent dx systolic CHF, EF 16-60%; also early dilation RV & PA pressure 49. Clear lungs, no JVD, no S3 gallop but 2-3+ peripheral edema & BNP around 500 more consistent w R heart failure. EKG w sinus, RBBB, deep Q waves inferior leads & echo w inferior wall motion abnormalities suspicious for prior myocardial injury. He denies any sxs referable to an acute MI. Recent CXR w cardiomegaly without vascular congestion. Tortuous aorta. BP on flow sheets going back 5 yrs appears to be well controlled. No FH of early cardiac disease. 2 sisters & a brother A&W. CKD 3. Aborted attempt at increasing his furosemide at expense of further decline in renal function may have been premature. We will continue current dose of furosemide, add torsemide pending Cardiology appt next week. He will need ischemia workup - likely cath. If non-ischemic cardiomyopathy, then eval for cardiac amyloidosis.

## 2017-11-27 NOTE — Assessment & Plan Note (Addendum)
62 year old male diagnosed with HFrEF 2 months ago (EF 30-35%). Etiology of heart failure could be uncontrolled OSA (pulmonary artery pressure elevated to 49) and obesity or sequela from prior inferior wall MI as indicated on prior EKG. Echo showed mild dilation of right atrium and ventricle, concerning for development of right heart failure.  Presents today for 1 week follow-up after starting spironolactone and increasing metoprolol.  Jeremiah Conway is endorsing worsening shortness of breath, lower extremity edema, abdominal distention.  Reports compliance with spironolactone.  Continues to use 3 L nasal cannula oxygen and increases to 5 L with exertion.  Denies chest pain.  His weight is up 6 pounds from last week. Diuresis continues to be problematic with renal function.  It is unclear what his dry weight is, and he has been down to 130 pounds.  Current medication regiment includes Lasix 80 mg daily, metoprolol XR 200 mg daily, and spironolactone 25 mg daily.  Patient had angioedema to ACE inhibitor.  On exam, heart is RRR, lungs are CTAB, 1-2+ pitting edema past the knees.  Plan: - Start torsemide 20 mg daily, may increase to 40 mg if needed - Continue Lasix, metoprolol, spironolactone.  - Will call patient if lab results come back abnormal and warrant medication changes - Order chest x-ray -Labs: BNP, BMP -Cardiology appointment August 7  ADDENDUM: 11/27/17 5:11PM - Chest x-ray with mild interstitial pulmonary edema and  pleural effusions - BNP 1,824 (up from 500 two months ago) - EKG largely unchanged from prior: sinus rhythm with occasional PVCs, left axis deviation, right BBB, inferior infarct, ? Left atrial enlargement.

## 2017-11-27 NOTE — Progress Notes (Signed)
   CC: shortness of breath  HPI:  [name] is a 62 y.o. male with type 2 diabetes, hypertension, heart failure with reduced ejection fraction (30 to 35%), obstructive sleep apnea, obesity who presents today for follow-up of HFrEF after medication changes made last week.  Please see encounter tab for full details of HPI.  Past Medical History:  Diagnosis Date  . Allergic rhinitis   . Angioedema   . Bunion   . Diabetes mellitus   . History of hematuria 11/20/2007  . Hyperlipidemia   . Hypertension   . Hypokalemia   . Obesity   . OSA on CPAP   . Pericarditis 1988  . RBBB (right bundle branch block with left anterior fascicular block)   . Renal insufficiency    Cr 1.2 baseline  . Tobacco abuse    Stopped in 2002. Now smokes 1.5ppd  . Unspecified venous (peripheral) insufficiency     Physical Exam:  Vitals:   11/27/17 1019  BP: 133/87  Pulse: 91  Temp: 99.6 F (37.6 C)  TempSrc: Oral  SpO2: 97%  Weight: (!) 361 lb 3.2 oz (163.8 kg)  Height: 5\' 10"  (1.778 m)   Gen: Well appearing, NAD CV: RRR, no murmurs, no JVD Pulm: On 3L [place], Normal effort, CTA throughout, no wheezing Ext: Warm, 1-2+ pitting edema past the knees   Assessment & Plan:   See Encounters Tab for problem based charting.  Patient seen with Dr. Cyndie Chime

## 2017-11-27 NOTE — Patient Instructions (Addendum)
It was nice seeing you today. Thank you for choosing [facility] for your Primary Care.   We will add another fluid pill (Torsemide). This fluid pill is easier on your kidneys. Continue taking your lasix/furosemide as well and I will call you if we need to make any changes after your blood work comes back.   Start with one 20mg  pill of Torsemide daily and see how you respond. If you are still feeling short of breath and have lots of swelling on your legs, you may increase to 20mg  twice a day  Please go upstairs to 1st floor radiology and get your chest x-ray. I will call you if it's abnormal.   FOLLOW-UP INSTRUCTIONS When: Let's see what the heart doctor does. We will be able to see their notes.   Please contact the clinic if you have any problems, or need to be seen sooner.

## 2017-11-28 LAB — BMP8+ANION GAP
ANION GAP: 16 mmol/L (ref 10.0–18.0)
BUN / CREAT RATIO: 14 (ref 10–24)
BUN: 24 mg/dL (ref 8–27)
CO2: 29 mmol/L (ref 20–29)
CREATININE: 1.67 mg/dL — AB (ref 0.76–1.27)
Calcium: 9.5 mg/dL (ref 8.6–10.2)
Chloride: 100 mmol/L (ref 96–106)
GFR calc Af Amer: 50 mL/min/{1.73_m2} — ABNORMAL LOW (ref >59)
GFR, EST NON AFRICAN AMERICAN: 44 mL/min/{1.73_m2} — AB (ref >59)
Glucose: 140 mg/dL — ABNORMAL HIGH (ref 65–99)
POTASSIUM: 4 mmol/L (ref 3.5–5.2)
SODIUM: 145 mmol/L — AB (ref 134–144)

## 2017-11-28 NOTE — Progress Notes (Signed)
Internal Medicine Clinic Attending  I saw and evaluated the patient.  I personally confirmed the key portions of the history and exam documented by Dr. Vogel and I reviewed pertinent patient test results.  The assessment, diagnosis, and plan were formulated together and I agree with the documentation in the resident's note.  

## 2017-12-04 ENCOUNTER — Ambulatory Visit: Payer: Medicaid Other | Admitting: Cardiology

## 2017-12-04 ENCOUNTER — Encounter: Payer: Self-pay | Admitting: Cardiology

## 2017-12-04 ENCOUNTER — Other Ambulatory Visit: Payer: Self-pay | Admitting: Cardiology

## 2017-12-04 VITALS — BP 136/80 | HR 92 | Ht 70.0 in | Wt 345.8 lb

## 2017-12-04 DIAGNOSIS — E1159 Type 2 diabetes mellitus with other circulatory complications: Secondary | ICD-10-CM

## 2017-12-04 DIAGNOSIS — I10 Essential (primary) hypertension: Secondary | ICD-10-CM | POA: Diagnosis not present

## 2017-12-04 DIAGNOSIS — I5020 Unspecified systolic (congestive) heart failure: Secondary | ICD-10-CM

## 2017-12-04 DIAGNOSIS — Z79899 Other long term (current) drug therapy: Secondary | ICD-10-CM

## 2017-12-04 DIAGNOSIS — I152 Hypertension secondary to endocrine disorders: Secondary | ICD-10-CM

## 2017-12-04 MED ORDER — ISOSORB DINITRATE-HYDRALAZINE 20-37.5 MG PO TABS: 1.0000 | ORAL_TABLET | Freq: Two times a day (BID) | ORAL | 6 refills | Status: DC

## 2017-12-04 MED ORDER — TORSEMIDE 20 MG PO TABS: 40.0000 mg | ORAL_TABLET | Freq: Two times a day (BID) | ORAL | 0 refills | Status: DC

## 2017-12-04 NOTE — Patient Instructions (Signed)
Medication Instructions:  Stop: Lasix  Start: Torsemide 40 mg, twice a day, by mouth  Start Bidil 20/37.5, two times a day, by mouth Labwork: 1 week: BMET   Testing/Procedures: None  Follow-Up: 1 week with Dr. Anne Fu, PA or NP  If you need a refill on your cardiac medications before your next appointment, please call your pharmacy.

## 2017-12-04 NOTE — Progress Notes (Signed)
Cardiology Office Note:    Date:  12/04/2017   ID:  Jeremiah Conway, DOB 03/26/1956, MRN [id number]  PCP:  Ali Lowe, MD  Cardiologist:  No primary care provider on file.   Referring MD: Valentino Nose, MD     History of Present Illness:    Jeremiah Conway is a 62 y.o. male here for evaluation of chronic systolic heart failure at the request of Dr. Petra Kuba.  Has type 2 diabetes, hypertension, ejection fraction of 30 to 35%, obstructive sleep apnea, morbid obesity, chronic kidney disease stage III creatinine ranging from 1.4-1.7.  Diagnosed in May 2019 with systolic heart failure.  Presumed etiology from possible prior inferior wall MI indicated on ECG, obstructive sleep apnea, morbid obesity combination.  Echo also showed dilation of right atrium.  Has begun goal-directed therapy, spironolactone, metoprolol.  Also utilizes nasal cannula oxygen.  Weight has been an issue.  Has increased as an outpatient despite diuretics.  Cannot be on ACE inhibitor because of angioedema, no Entresto because of angioedema.  On metoprolol.  He was switched from Lasix 80 mg a day to torsemide 20 mg a day given his increase in weight gain.  BNP is 1800, increased.  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  SOB, new meds, may be helping. Before 20 ft. Out of breath. Does feel some chest fullness when pushing. No syncope. 2 months ago was sleeping sitting up. Now on 2pillow. Using O2. Supposed to take OSA test soon. Tries to stay away from salt. Drinks a lot of coffee.   Past Medical History:  Diagnosis Date  . Allergic rhinitis   . Angioedema   . Bunion   . Diabetes mellitus   . History of hematuria 11/20/2007  . Hyperlipidemia   . Hypertension   . Hypokalemia   . Obesity   . OSA on CPAP   . Pericarditis 1988  . RBBB (right bundle branch block with left anterior fascicular block)   . Renal insufficiency    Cr 1.2 baseline  . Tobacco abuse    Stopped in 2002. Now smokes 1.5ppd  .  Unspecified venous (peripheral) insufficiency     Past Surgical History:  Procedure Laterality Date  . fluid retention  1988   removed from abd  . MOUTH SURGERY N/A 07.08.2014    Current Medications: Current Meds  Medication Sig  . albuterol (PROVENTIL HFA;VENTOLIN HFA) 108 (90 Base) MCG/ACT inhaler Inhale 1-2 puffs into the lungs every 6 (six) hours as needed for wheezing or shortness of breath.  Marland Kitchen aspirin 81 MG tablet Take 1 tablet (81 mg total) by mouth daily.  Marland Kitchen atorvastatin (LIPITOR) 80 MG tablet TAKE 1 TABLET BY MOUTH DAILY  . Elastic Bandages & Supports (V-5 HIGH COMPRESSION HOSE) MISC 1pair, Wear them while driving, walking, standing  . fluticasone (FLONASE) 50 MCG/ACT nasal spray Place 1 spray into both nostrils daily.  . metFORMIN (GLUCOPHAGE) 1000 MG tablet TAKE 1 TABLET BY MOUTH TWICE DAILY WITH A MEAL  . metoprolol (TOPROL XL) 200 MG 24 hr tablet Take 1 tablet (200 mg total) by mouth daily.  . montelukast (SINGULAIR) 10 MG tablet Take 1 tablet (10 mg total) by mouth at bedtime.  Marland Kitchen spironolactone (ALDACTONE) 25 MG tablet Take 1 tablet (25 mg total) by mouth daily.  . tamsulosin (FLOMAX) 0.4 MG CAPS capsule TAKE ONE CAPSULE BY MOUTH DAILY AFTER BREAKFAST  . [DISCONTINUED] furosemide (LASIX) 40 MG tablet Take 2 tablets (80 mg total) by mouth daily.  . [  DISCONTINUED] torsemide (DEMADEX) 10 MG tablet Take 2 tablets (20 mg total) by mouth daily.     Allergies:   Angiotensin receptor blockers and Ace inhibitors   Social History   Socioeconomic History  . Marital status: Divorced    Spouse name: Not on file  . Number of children: Not on file  . Years of education: Not on file  . Highest education level: Not on file  Occupational History  . Not on file  Social Needs  . Financial resource strain: Not on file  . Food insecurity:    Worry: Not on file    Inability: Not on file  . Transportation needs:    Medical: Not on file    Non-medical: Not on file  Tobacco Use  .  Smoking status: Former Smoker    Packs/day: 0.50    Types: Cigarettes    Last attempt to quit: 06/10/2012    Years since quitting: 5.4  . Smokeless tobacco: Never Used  . Tobacco comment: given quit line info  Substance and Sexual Activity  . Alcohol use: Yes    Alcohol/week: 0.0 oz    Comment: On holidays.  . Drug use: No  . Sexual activity: Not on file  Lifestyle  . Physical activity:    Days per week: Not on file    Minutes per session: Not on file  . Stress: Not on file  Relationships  . Social connections:    Talks on phone: Not on file    Gets together: Not on file    Attends religious service: Not on file    Active member of club or organization: Not on file    Attends meetings of clubs or organizations: Not on file    Relationship status: Not on file  Other Topics Concern  . Not on file  Social History Narrative   Works as a [employer].   Smokes 1.5 ppd.      Family History: The patient's family history includes Colon cancer in his maternal grandfather; Colon polyps in his maternal grandfather; Diabetes in his father; Heart disease in his father; Hypertension in his father and mother; Ovarian cancer in his maternal aunt.  ROS:   Please see the history of present illness.     All other systems reviewed and are negative.  EKGs/Labs/Other Studies Reviewed:    The following studies were reviewed today:  ECHO 09/30/17:  - Left ventricle: Diffuse hypokinesis worse in the inferior wall.   The cavity size was mildly dilated. Wall thickness was increased   in a pattern of moderate LVH. Systolic function was moderately to   severely reduced. The estimated ejection fraction was in the   range of 30% to 35%. Left ventricular diastolic function   parameters were normal. - Aortic valve: There was mild regurgitation. - Mitral valve: There was mild regurgitation. - Left atrium: The atrium was moderately dilated. - Right ventricle: The cavity size was mildly dilated. -  Right atrium: The atrium was mildly dilated. - Atrial septum: No defect or patent foramen ovale was identified. - Pulmonary arteries: PA peak pressure: 49 mm Hg (S).  EKG:  EKG is  ordered today.  The ekg ordered today demonstrates 12/04/2017-92 sinus rhythm right bundle branch block, inferior infarct pattern.  Recent Labs: 09/17/2017: ALT 34; Hemoglobin 12.0; Platelets 234 11/27/2017: B Natriuretic Peptide 1,824.1; BUN 24; Creatinine, Ser 1.67; Potassium 4.0; Sodium 145  Recent Lipid Panel    Component Value Date/Time   CHOL 172 09/16/2013  1439   TRIG 162 (H) 09/16/2013 1439   HDL 34 (L) 09/16/2013 1439   CHOLHDL 5.1 09/16/2013 1439   VLDL 32 09/16/2013 1439   LDLCALC 106 (H) 09/16/2013 1439    Physical Exam:    VS:  BP 136/80   Pulse 92   Ht 5\' 10"  (1.778 m)   Wt (!) 345 lb 12.8 oz (156.9 kg)   BMI 49.62 kg/m     Wt Readings from Last 3 Encounters:  12/04/17 (!) 345 lb 12.8 oz (156.9 kg)  11/27/17 (!) 361 lb 3.2 oz (163.8 kg)  11/21/17 (!) 354 lb 9.6 oz (160.8 kg)     GEN: Obese Well nourished, well developed in no acute distress HEENT: Normal, [place] o2 NECK: No JVD; No carotid bruits LYMPHATICS: No lymphadenopathy CARDIAC: RRR, no murmurs, rubs, gallops RESPIRATORY:  Clear to auscultation without rales, wheezing or rhonchi  ABDOMEN: Soft, non-tender, non-distended MUSCULOSKELETAL:  No edema; No deformity  SKIN: Warm and dry NEUROLOGIC:  Alert and oriented x 3 PSYCHIATRIC:  Normal affect   ASSESSMENT:    1. Systolic heart failure, unspecified HF chronicity (HCC)   2. Hypertension associated with diabetes (HCC)   3. High risk medication use   4. Morbid obesity (HCC)    PLAN:    In order of problems listed above:  Chronic systolic heart failure -EF 30 to 35% currently on metoprolol, spironolactone, torsemide.  Unable to take ACE inhibitor or angiotensin receptor blocker/Entresto because of prior angioedema.  Etiology unconfirmed. - He states that when taking the  Demadex he seems to be urinating more.  I will go ahead and stop his Lasix and place him on Demadex 40 mg twice a day.  We will see him back in 1 week with basic metabolic profile.  1.5 L a day.  Less than 2 g salt.  Daily weights. -I will also start BiDil twice daily. -Once better compensated, and stable, we will then have him perform left and right heart catheterization.  Radial approach.  Morbid obesity -Continue to encourage weight loss.  Diabetes with hypertension/obstructive sleep apnea/obesity hypoventilation syndrome/chronic kidney disease - Hemoglobin A1c 6.1.  Well-controlled.  Home oxygen. - Creatinine ranging from 1.4-1.7.  Right bundle branch block/inferior infarct pattern -Abnormal EKG noted.  No syncope  We will see him back in 1 to 2 weeks.  Medication Adjustments/Labs and Tests Ordered: Current medicines are reviewed at length with the patient today.  Concerns regarding medicines are outlined above.  Orders Placed This Encounter  Procedures  . Basic Metabolic Panel (BMET)  . EKG 12-Lead   Meds ordered this encounter  Medications  . isosorbide-hydrALAZINE (BIDIL) 20-37.5 MG tablet    Sig: Take 1 tablet by mouth 2 (two) times daily.    Dispense:  60 tablet    Refill:  6  . torsemide (DEMADEX) 20 MG tablet    Sig: Take 2 tablets (40 mg total) by mouth 2 (two) times daily.    Dispense:  120 tablet    Refill:  0    Patient Instructions  Medication Instructions:  Stop: Lasix  Start: Torsemide 40 mg, twice a day, by mouth  Start Bidil 20/37.5, two times a day, by mouth Labwork: 1 week: BMET   Testing/Procedures: None  Follow-Up: 1 week with Dr. Anne Fu, PA or NP  If you need a refill on your cardiac medications before your next appointment, please call your pharmacy.      Signed, Donato Schultz, MD  12/04/2017 9:50 AM  [facility]

## 2017-12-12 ENCOUNTER — Encounter: Payer: Self-pay | Admitting: *Deleted

## 2017-12-12 ENCOUNTER — Encounter: Payer: Self-pay | Admitting: Cardiology

## 2017-12-12 ENCOUNTER — Ambulatory Visit (INDEPENDENT_AMBULATORY_CARE_PROVIDER_SITE_OTHER): Payer: Medicaid Other | Admitting: Cardiology

## 2017-12-12 VITALS — BP 136/80 | HR 94 | Ht 70.0 in | Wt 324.0 lb

## 2017-12-12 DIAGNOSIS — Z79899 Other long term (current) drug therapy: Secondary | ICD-10-CM

## 2017-12-12 DIAGNOSIS — Z01812 Encounter for preprocedural laboratory examination: Secondary | ICD-10-CM

## 2017-12-12 DIAGNOSIS — I5020 Unspecified systolic (congestive) heart failure: Secondary | ICD-10-CM

## 2017-12-12 NOTE — Progress Notes (Signed)
Cardiology Office Note:    Date:  12/12/2017   ID:  Jeremiah Conway, DOB [birth year], MRN [id number]  PCP:  Ali Lowe, MD  Cardiologist:  No primary care provider on file.   Referring MD: Ali Lowe, MD     History of Present Illness:    Jeremiah Conway is a 62 y.o. male here for evaluation of chronic systolic heart failure at the request of Dr. Petra Kuba.  Has type 2 diabetes, hypertension, ejection fraction of 30 to 35%, obstructive sleep apnea, morbid obesity, chronic kidney disease stage III creatinine ranging from 1.4-1.7.  Diagnosed in May 2019 with systolic heart failure.  Presumed etiology from possible prior inferior wall MI indicated on ECG, obstructive sleep apnea, morbid obesity combination.  Echo also showed dilation of right atrium.  Has begun goal-directed therapy, spironolactone, metoprolol.  Also utilizes nasal cannula oxygen.  Weight has been an issue.  Has increased as an outpatient despite diuretics.  Cannot be on ACE inhibitor because of angioedema, no Entresto because of angioedema.  On metoprolol.  He was switched from Lasix 80 mg a day to torsemide 20 mg a day given his increase in weight gain.  BNP is 1800, increased.  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  SOB, new meds, may be helping. Before 20 ft. Out of breath. Does feel some chest fullness when pushing. No syncope. 2 months ago was sleeping sitting up. Now on 2pillow. Using O2. Supposed to take OSA test soon. Tries to stay away from salt. Drinks a lot of coffee.   Past Medical History:  Diagnosis Date  . Allergic rhinitis   . Angioedema   . Bunion   . Diabetes mellitus   . History of hematuria 11/20/2007  . Hyperlipidemia   . Hypertension   . Hypokalemia   . Obesity   . OSA on CPAP   . Pericarditis 1988  . RBBB (right bundle branch block with left anterior fascicular block)   . Renal insufficiency    Cr 1.2 baseline  . Tobacco abuse    Stopped in 2002. Now smokes 1.5ppd  .  Unspecified venous (peripheral) insufficiency     Past Surgical History:  Procedure Laterality Date  . fluid retention  1988   removed from abd  . MOUTH SURGERY N/A 07.08.2014    Current Medications: Current Meds  Medication Sig  . albuterol (PROVENTIL HFA;VENTOLIN HFA) 108 (90 Base) MCG/ACT inhaler Inhale 1-2 puffs into the lungs every 6 (six) hours as needed for wheezing or shortness of breath.  Marland Kitchen aspirin 81 MG tablet Take 1 tablet (81 mg total) by mouth daily.  Marland Kitchen atorvastatin (LIPITOR) 80 MG tablet TAKE 1 TABLET BY MOUTH DAILY  . Elastic Bandages & Supports (V-5 HIGH COMPRESSION HOSE) MISC 1pair, Wear them while driving, walking, standing  . isosorbide-hydrALAZINE (BIDIL) 20-37.5 MG tablet Take 1 tablet by mouth 2 (two) times daily.  . metFORMIN (GLUCOPHAGE) 1000 MG tablet TAKE 1 TABLET BY MOUTH TWICE DAILY WITH A MEAL  . metoprolol (TOPROL XL) 200 MG 24 hr tablet Take 1 tablet (200 mg total) by mouth daily.  . montelukast (SINGULAIR) 10 MG tablet Take 1 tablet (10 mg total) by mouth at bedtime.  Marland Kitchen spironolactone (ALDACTONE) 25 MG tablet Take 1 tablet (25 mg total) by mouth daily.  . tamsulosin (FLOMAX) 0.4 MG CAPS capsule TAKE ONE CAPSULE BY MOUTH DAILY AFTER BREAKFAST  . torsemide (DEMADEX) 20 MG tablet Take 2 tablets (40 mg total) by mouth 2 (  two) times daily.     Allergies:   Angiotensin receptor blockers and Ace inhibitors   Social History   Socioeconomic History  . Marital status: Divorced    Spouse name: Not on file  . Number of children: Not on file  . Years of education: Not on file  . Highest education level: Not on file  Occupational History  . Not on file  Social Needs  . Financial resource strain: Not on file  . Food insecurity:    Worry: Not on file    Inability: Not on file  . Transportation needs:    Medical: Not on file    Non-medical: Not on file  Tobacco Use  . Smoking status: Former Smoker    Packs/day: 0.50    Types: Cigarettes    Last attempt  to quit: 06/10/2012    Years since quitting: 5.5  . Smokeless tobacco: Never Used  . Tobacco comment: given quit line info  Substance and Sexual Activity  . Alcohol use: Yes    Alcohol/week: 0.0 standard drinks    Comment: On holidays.  . Drug use: No  . Sexual activity: Not on file  Lifestyle  . Physical activity:    Days per week: Not on file    Minutes per session: Not on file  . Stress: Not on file  Relationships  . Social connections:    Talks on phone: Not on file    Gets together: Not on file    Attends religious service: Not on file    Active member of club or organization: Not on file    Attends meetings of clubs or organizations: Not on file    Relationship status: Not on file  Other Topics Concern  . Not on file  Social History Narrative   Works as a [employer].   Smokes 1.5 ppd.      Family History: The patient's family history includes Colon cancer in his maternal grandfather; Colon polyps in his maternal grandfather; Diabetes in his father; Heart disease in his father; Hypertension in his father and mother; Ovarian cancer in his maternal aunt.  ROS:   Please see the history of present illness.     All other systems reviewed and are negative.  EKGs/Labs/Other Studies Reviewed:    The following studies were reviewed today:  ECHO 09/30/17:  - Left ventricle: Diffuse hypokinesis worse in the inferior wall.   The cavity size was mildly dilated. Wall thickness was increased   in a pattern of moderate LVH. Systolic function was moderately to   severely reduced. The estimated ejection fraction was in the   range of 30% to 35%. Left ventricular diastolic function   parameters were normal. - Aortic valve: There was mild regurgitation. - Mitral valve: There was mild regurgitation. - Left atrium: The atrium was moderately dilated. - Right ventricle: The cavity size was mildly dilated. - Right atrium: The atrium was mildly dilated. - Atrial septum: No defect or  patent foramen ovale was identified. - Pulmonary arteries: PA peak pressure: 49 mm Hg (S).  EKG:  EKG is  ordered today.  The ekg ordered today demonstrates 12/04/2017-92 sinus rhythm right bundle branch block, inferior infarct pattern.  Recent Labs: 09/17/2017: ALT 34; Hemoglobin 12.0; Platelets 234 11/27/2017: B Natriuretic Peptide 1,824.1; BUN 24; Creatinine, Ser 1.67; Potassium 4.0; Sodium 145  Recent Lipid Panel    Component Value Date/Time   CHOL 172 09/16/2013 1439   TRIG 162 (H) 09/16/2013 1439   HDL  34 (L) 09/16/2013 1439   CHOLHDL 5.1 09/16/2013 1439   VLDL 32 09/16/2013 1439   LDLCALC 106 (H) 09/16/2013 1439    Physical Exam:    VS:  BP 136/80   Pulse 94   Ht 5\' 10"  (1.778 m)   Wt (!) 324 lb (147 kg)   SpO2 96%   BMI 46.49 kg/m     Wt Readings from Last 3 Encounters:  12/12/17 (!) 324 lb (147 kg)  12/04/17 (!) 345 lb 12.8 oz (156.9 kg)  11/27/17 (!) 361 lb 3.2 oz (163.8 kg)     GEN: Well nourished, well developed, in no acute distress, obese HEENT: normal [place] O2 Neck: no JVD, carotid bruits, or masses Cardiac: RRR; No murmurs, rubs, or gallops, improved 2+ edema  Respiratory:  Mild wheeze bilaterally, normal work of breathing GI: soft, nontender, nondistended, + BS MS: no deformity or atrophy  Skin: warm and dry, no rash Neuro:  Alert and Oriented x 3, Strength and sensation are intact Psych: euthymic mood, full affect   ASSESSMENT:    1. Systolic heart failure, unspecified HF chronicity (HCC)   2. High risk medication use   3. Pre-procedure lab exam    PLAN:    In order of problems listed above:  Chronic systolic heart failure -EF 30 to 35% currently on metoprolol, spironolactone, torsemide.  Unable to take ACE inhibitor or angiotensin receptor blocker/Entresto because of prior angioedema.  Etiology of heart failure unconfirmed.  We will go ahead and check a right and left heart catheterization radial/brachial.  Risks and benefits explained  including stroke heart attack death renal impairment bleeding.  I think he is ready to tolerate procedure.  He is down approximately 40 pounds. -  Demadex, he seems to be urinating more, improved. Switched from Lasix  -  Demadex 40 mg twice a day.  Basic metabolic profile done, creatinine 1.67 within upper range for him.  1.5 L a day.  Less than 2 g salt.  Daily weights.  We will recheck. -Started BiDil twice daily.  Morbid obesity -Continue to encourage weight loss.  40 pounds of fluid has been taken off at this point.  Diabetes with hypertension/obstructive sleep apnea/obesity hypoventilation syndrome/chronic kidney disease - Hemoglobin A1c 6.1.  Well-controlled.  Home oxygen. - Creatinine ranging from 1.4-1.7.  Right bundle branch block/inferior infarct pattern -Abnormal EKG noted.  No syncope  We will see him back one month post cath.   Medication Adjustments/Labs and Tests Ordered: Current medicines are reviewed at length with the patient today.  Concerns regarding medicines are outlined above.  Orders Placed This Encounter  Procedures  . Basic metabolic panel  . CBC   No orders of the defined types were placed in this encounter.   Patient Instructions  Medication Instructions:  The current medical regimen is effective;  continue present plan and medications.  Labwork: Please have blood work today (BMP, CBC)  Testing/Procedures: Your physician has requested that you have a cardiac catheterization. Cardiac catheterization is used to diagnose and/or treat various heart conditions. Doctors may recommend this procedure for a number of different reasons. The most common reason is to evaluate chest pain. Chest pain can be a symptom of coronary artery disease (CAD), and cardiac catheterization can show whether plaque is narrowing or blocking your heart's arteries. This procedure is also used to evaluate the valves, as well as measure the blood flow and oxygen levels in different parts  of your heart. For further information please visit  [email or web address] Please follow instruction sheet, as given.  Follow-Up: Follow up 1 month after cardiac cath with Dr Anne Fu.  If you need a refill on your cardiac medications before your next appointment, please call your pharmacy.  Thank you for choosing [facility]!!        Signed, Donato Schultz, MD  12/12/2017 9:50 AM    [facility]

## 2017-12-12 NOTE — Patient Instructions (Addendum)
Medication Instructions:  The current medical regimen is effective;  continue present plan and medications.  Labwork: Please have blood work today (BMP, CBC)  Testing/Procedures: Your physician has requested that you have a cardiac catheterization. Cardiac catheterization is used to diagnose and/or treat various heart conditions. Doctors may recommend this procedure for a number of different reasons. The most common reason is to evaluate chest pain. Chest pain can be a symptom of coronary artery disease (CAD), and cardiac catheterization can show whether plaque is narrowing or blocking your heart's arteries. This procedure is also used to evaluate the valves, as well as measure the blood flow and oxygen levels in different parts of your heart. For further information please visit [email or web address] Please follow instruction sheet, as given.  Follow-Up: Follow up 1 month after cardiac cath with Dr Anne Fu.  If you need a refill on your cardiac medications before your next appointment, please call your pharmacy.  Thank you for choosing [facility]!!

## 2017-12-13 ENCOUNTER — Telehealth: Payer: Self-pay | Admitting: *Deleted

## 2017-12-13 DIAGNOSIS — Z79899 Other long term (current) drug therapy: Secondary | ICD-10-CM

## 2017-12-13 DIAGNOSIS — I509 Heart failure, unspecified: Secondary | ICD-10-CM

## 2017-12-13 LAB — BASIC METABOLIC PANEL
BUN/Creatinine Ratio: 13 (ref 10–24)
BUN: 24 mg/dL (ref 8–27)
CO2: 35 mmol/L — ABNORMAL HIGH (ref 20–29)
CREATININE: 1.82 mg/dL — AB (ref 0.76–1.27)
Calcium: 9.7 mg/dL (ref 8.6–10.2)
Chloride: 94 mmol/L — ABNORMAL LOW (ref 96–106)
GFR, EST AFRICAN AMERICAN: 45 mL/min/{1.73_m2} — AB (ref >59)
GFR, EST NON AFRICAN AMERICAN: 39 mL/min/{1.73_m2} — AB (ref >59)
Glucose: 156 mg/dL — ABNORMAL HIGH (ref 65–99)
Potassium: 4.1 mmol/L (ref 3.5–5.2)
Sodium: 144 mmol/L (ref 134–144)

## 2017-12-13 LAB — CBC
HEMATOCRIT: 39.8 % (ref 37.5–51.0)
Hemoglobin: 12.5 g/dL — ABNORMAL LOW (ref 13.0–17.7)
MCH: 26.5 pg — ABNORMAL LOW (ref 26.6–33.0)
MCHC: 31.4 g/dL — AB (ref 31.5–35.7)
MCV: 84 fL (ref 79–97)
Platelets: 244 10*3/uL (ref 150–450)
RBC: 4.72 x10E6/uL (ref 4.14–5.80)
RDW: 16.2 % — AB (ref 12.3–15.4)
WBC: 6.1 10*3/uL (ref 3.4–10.8)

## 2017-12-13 NOTE — Telephone Encounter (Signed)
Reviewed results of lab with pt.  He is again aware to hold Demadex the AM of his cath as well as Metformin.  Aware to repeat lab 9/3.  Orders placed and appt scheduled.   Hold Demedex morning of cath study.  Not on ACE-I  Creat increase tolerated with ongoing diuresis. (Close to 40 pound weight loss, clinical improvement)  Check BMET again in 2 weeks  Donato Schultz, MD

## 2017-12-16 ENCOUNTER — Other Ambulatory Visit: Payer: Self-pay

## 2017-12-16 ENCOUNTER — Encounter (HOSPITAL_COMMUNITY)
Admission: RE | Disposition: A | Discharge status: Home / Self Care | Payer: Self-pay | Source: Ambulatory Visit | Attending: Interventional Cardiology

## 2017-12-16 ENCOUNTER — Ambulatory Visit (HOSPITAL_COMMUNITY)
Admission: RE | Admit: 2017-12-16 | Discharge: 2017-12-16 | Disposition: A | Discharge status: Home / Self Care | Payer: Medicaid Other | Source: Ambulatory Visit | Attending: Interventional Cardiology | Admitting: Interventional Cardiology

## 2017-12-16 DIAGNOSIS — I429 Cardiomyopathy, unspecified: Secondary | ICD-10-CM | POA: Insufficient documentation

## 2017-12-16 DIAGNOSIS — E669 Obesity, unspecified: Secondary | ICD-10-CM | POA: Diagnosis not present

## 2017-12-16 DIAGNOSIS — Z87891 Personal history of nicotine dependence: Secondary | ICD-10-CM | POA: Diagnosis not present

## 2017-12-16 DIAGNOSIS — Z888 Allergy status to other drugs, medicaments and biological substances status: Secondary | ICD-10-CM | POA: Insufficient documentation

## 2017-12-16 DIAGNOSIS — Z833 Family history of diabetes mellitus: Secondary | ICD-10-CM | POA: Diagnosis not present

## 2017-12-16 DIAGNOSIS — I4510 Unspecified right bundle-branch block: Secondary | ICD-10-CM | POA: Insufficient documentation

## 2017-12-16 DIAGNOSIS — Z6841 Body Mass Index (BMI) 40.0 and over, adult: Secondary | ICD-10-CM | POA: Diagnosis not present

## 2017-12-16 DIAGNOSIS — Z9889 Other specified postprocedural states: Secondary | ICD-10-CM | POA: Diagnosis not present

## 2017-12-16 DIAGNOSIS — I110 Hypertensive heart disease with heart failure: Secondary | ICD-10-CM | POA: Insufficient documentation

## 2017-12-16 DIAGNOSIS — I5021 Acute systolic (congestive) heart failure: Secondary | ICD-10-CM | POA: Insufficient documentation

## 2017-12-16 DIAGNOSIS — Z8249 Family history of ischemic heart disease and other diseases of the circulatory system: Secondary | ICD-10-CM | POA: Insufficient documentation

## 2017-12-16 DIAGNOSIS — E119 Type 2 diabetes mellitus without complications: Secondary | ICD-10-CM | POA: Diagnosis not present

## 2017-12-16 DIAGNOSIS — G4733 Obstructive sleep apnea (adult) (pediatric): Secondary | ICD-10-CM | POA: Insufficient documentation

## 2017-12-16 DIAGNOSIS — I5020 Unspecified systolic (congestive) heart failure: Secondary | ICD-10-CM

## 2017-12-16 DIAGNOSIS — E785 Hyperlipidemia, unspecified: Secondary | ICD-10-CM | POA: Diagnosis not present

## 2017-12-16 DIAGNOSIS — I2510 Atherosclerotic heart disease of native coronary artery without angina pectoris: Secondary | ICD-10-CM | POA: Insufficient documentation

## 2017-12-16 HISTORY — PX: RIGHT/LEFT HEART CATH AND CORONARY ANGIOGRAPHY: CATH118266

## 2017-12-16 LAB — POCT I-STAT 3, ART BLOOD GAS (G3+)
ACID-BASE EXCESS: 8 mmol/L — AB (ref 0.0–2.0)
ACID-BASE EXCESS: 7 mmol/L — AB (ref 0.0–2.0)
Acid-Base Excess: 7 mmol/L — ABNORMAL HIGH (ref 0.0–2.0)
BICARBONATE: 34.4 mmol/L — AB (ref 20.0–28.0)
Bicarbonate: 33.4 mmol/L — ABNORMAL HIGH (ref 20.0–28.0)
Bicarbonate: 33.6 mmol/L — ABNORMAL HIGH (ref 20.0–28.0)
O2 SAT: 83 %
O2 SAT: 93 %
O2 Saturation: 85 %
PCO2 ART: 56.9 mmHg — AB (ref 32.0–48.0)
PCO2 ART: 55.9 mmHg — AB (ref 32.0–48.0)
PO2 ART: 50 mmHg — AB (ref 83.0–108.0)
PO2 ART: 52 mmHg — AB (ref 83.0–108.0)
PO2 ART: 70 mmHg — AB (ref 83.0–108.0)
TCO2: 35 mmol/L — AB (ref 22–32)
TCO2: 36 mmol/L — ABNORMAL HIGH (ref 22–32)
TCO2: 35 mmol/L — AB (ref 22–32)
pCO2 arterial: 54.9 mmHg — ABNORMAL HIGH (ref 32.0–48.0)
pH, Arterial: 7.39 (ref 7.350–7.450)
pH, Arterial: 7.393 (ref 7.350–7.450)
pH, Arterial: 7.387 (ref 7.350–7.450)

## 2017-12-16 LAB — POCT I-STAT 3, VENOUS BLOOD GAS (G3P V)
Acid-Base Excess: 7 mmol/L — ABNORMAL HIGH (ref 0.0–2.0)
BICARBONATE: 34.1 mmol/L — AB (ref 20.0–28.0)
O2 Saturation: 58 %
PH VEN: 7.374 (ref 7.250–7.430)
PO2 VEN: 32 mmHg (ref 32.0–45.0)
TCO2: 36 mmol/L — AB (ref 22–32)
pCO2, Ven: 58.4 mmHg (ref 44.0–60.0)

## 2017-12-16 LAB — BASIC METABOLIC PANEL
Anion gap: 13 (ref 5–15)
BUN: 28 mg/dL — ABNORMAL HIGH (ref 8–23)
CO2: 29 mmol/L (ref 22–32)
Calcium: 9.3 mg/dL (ref 8.9–10.3)
Chloride: 103 mmol/L (ref 98–111)
Creatinine, Ser: 1.76 mg/dL — ABNORMAL HIGH (ref 0.61–1.24)
GFR calc Af Amer: 46 mL/min — ABNORMAL LOW (ref >60)
GFR, EST NON AFRICAN AMERICAN: 40 mL/min — AB (ref >60)
Glucose, Bld: 125 mg/dL — ABNORMAL HIGH (ref 70–99)
POTASSIUM: 3.6 mmol/L (ref 3.5–5.1)
SODIUM: 145 mmol/L (ref 135–145)

## 2017-12-16 LAB — GLUCOSE, CAPILLARY
GLUCOSE-CAPILLARY: 139 mg/dL — AB (ref 70–99)
GLUCOSE-CAPILLARY: 115 mg/dL — AB (ref 70–99)

## 2017-12-16 SURGERY — RIGHT/LEFT HEART CATH AND CORONARY ANGIOGRAPHY
Anesthesia: LOCAL

## 2017-12-16 MED ORDER — LIDOCAINE HCL (PF) 1 % IJ SOLN
INTRAMUSCULAR | Status: DC | PRN
  Administered 2017-12-16 (×2): 3 mL

## 2017-12-16 MED ORDER — SODIUM CHLORIDE 0.9 % IV SOLN: 250.0000 mL | INTRAVENOUS | Status: DC | PRN

## 2017-12-16 MED ORDER — HEPARIN (PORCINE) IN NACL 1000-0.9 UT/500ML-% IV SOLN
INTRAVENOUS | Status: DC | PRN
  Administered 2017-12-16 (×2): 500 mL

## 2017-12-16 MED ORDER — FENTANYL CITRATE (PF) 100 MCG/2ML IJ SOLN
INTRAMUSCULAR | Status: AC
  Filled 2017-12-16: qty 2

## 2017-12-16 MED ORDER — ASPIRIN 81 MG PO CHEW: 81.0000 mg | CHEWABLE_TABLET | ORAL | Status: DC

## 2017-12-16 MED ORDER — HEPARIN (PORCINE) IN NACL 1000-0.9 UT/500ML-% IV SOLN
INTRAVENOUS | Status: AC
  Filled 2017-12-16: qty 500

## 2017-12-16 MED ORDER — LIDOCAINE HCL (PF) 1 % IJ SOLN
INTRAMUSCULAR | Status: AC
  Filled 2017-12-16: qty 30

## 2017-12-16 MED ORDER — SODIUM CHLORIDE 0.9 % IV SOLN
INTRAVENOUS | Status: DC
  Administered 2017-12-16: 10:00:00 via INTRAVENOUS

## 2017-12-16 MED ORDER — ACETAMINOPHEN 325 MG PO TABS: 650.0000 mg | ORAL_TABLET | ORAL | Status: DC | PRN

## 2017-12-16 MED ORDER — ASPIRIN 81 MG PO CHEW
81.0000 mg | CHEWABLE_TABLET | Freq: Once | ORAL | Status: AC
Start: 2017-12-16 — End: 2017-12-16
  Administered 2017-12-16: 81 mg via ORAL

## 2017-12-16 MED ORDER — SODIUM CHLORIDE 0.9% FLUSH: 3.0000 mL | Freq: Two times a day (BID) | INTRAVENOUS | Status: DC

## 2017-12-16 MED ORDER — ONDANSETRON HCL 4 MG/2ML IJ SOLN: 4.0000 mg | Freq: Four times a day (QID) | INTRAMUSCULAR | Status: DC | PRN

## 2017-12-16 MED ORDER — ASPIRIN 81 MG PO CHEW
CHEWABLE_TABLET | ORAL | Status: AC
  Administered 2017-12-16: 81 mg via ORAL
  Filled 2017-12-16: qty 1

## 2017-12-16 MED ORDER — FENTANYL CITRATE (PF) 100 MCG/2ML IJ SOLN
INTRAMUSCULAR | Status: DC | PRN
  Administered 2017-12-16: 25 ug via INTRAVENOUS

## 2017-12-16 MED ORDER — IOHEXOL 350 MG/ML SOLN
INTRAVENOUS | Status: DC | PRN
  Administered 2017-12-16: 45 mL via INTRA_ARTERIAL

## 2017-12-16 MED ORDER — SODIUM CHLORIDE 0.9% FLUSH: 3.0000 mL | INTRAVENOUS | Status: DC | PRN

## 2017-12-16 MED ORDER — MIDAZOLAM HCL 2 MG/2ML IJ SOLN
INTRAMUSCULAR | Status: DC | PRN
  Administered 2017-12-16: 2 mg via INTRAVENOUS

## 2017-12-16 MED ORDER — METFORMIN HCL 1000 MG PO TABS: 1000.0000 mg | ORAL_TABLET | Freq: Two times a day (BID) | ORAL | Status: DC

## 2017-12-16 MED ORDER — MIDAZOLAM HCL 2 MG/2ML IJ SOLN
INTRAMUSCULAR | Status: AC
  Filled 2017-12-16: qty 2

## 2017-12-16 MED ORDER — VERAPAMIL HCL 2.5 MG/ML IV SOLN
INTRAVENOUS | Status: DC | PRN
  Administered 2017-12-16: 10 mL via INTRA_ARTERIAL

## 2017-12-16 MED ORDER — VERAPAMIL HCL 2.5 MG/ML IV SOLN
INTRAVENOUS | Status: AC
  Filled 2017-12-16: qty 2

## 2017-12-16 MED ORDER — HEPARIN SODIUM (PORCINE) 1000 UNIT/ML IJ SOLN
INTRAMUSCULAR | Status: DC | PRN
  Administered 2017-12-16: 6000 [IU] via INTRAVENOUS

## 2017-12-16 SURGICAL SUPPLY — 12 items

## 2017-12-16 NOTE — Progress Notes (Signed)
Hematoma noted right radial after tr band off and pressure held x and site soft; paged PA to see prior to discharge

## 2017-12-16 NOTE — Progress Notes (Signed)
PA Noreene Larsson in and checked right radial site and okay to d/c home no new orders

## 2017-12-16 NOTE — Discharge Instructions (Signed)
NO METFORMIN/GLUCOPHAGE FOR 2 DAYS ° ° ° °Radial Site Care °Refer to this sheet in the next few weeks. These instructions provide you with information about caring for yourself after your procedure. Your health care provider may also give you more specific instructions. Your treatment has been planned according to current medical practices, but problems sometimes occur. Call your health care provider if you have any problems or questions after your procedure. °What can I expect after the procedure? °After your procedure, it is typical to have the following: °· Bruising at the radial site that usually fades within 1-2 weeks. °· Blood collecting in the tissue (hematoma) that may be painful to the touch. It should usually decrease in size and tenderness within 1-2 weeks. ° °Follow these instructions at home: °· Take medicines only as directed by your health care provider. °· You may shower 24-48 hours after the procedure or as directed by your health care provider. Remove the bandage (dressing) and gently wash the site with plain soap and water. Pat the area dry with a clean towel. Do not rub the site, because this may cause bleeding. °· Do not take baths, swim, or use a hot tub until your health care provider approves. °· Check your insertion site every day for redness, swelling, or drainage. °· Do not apply powder or lotion to the site. °· Do not flex or bend the affected arm for 24 hours or as directed by your health care provider. °· Do not push or pull heavy objects with the affected arm for 24 hours or as directed by your health care provider. °· Do not lift over 10 lb (4.5 kg) for 5 days after your procedure or as directed by your health care provider. °· Ask your health care provider when it is okay to: °? Return to work or school. °? Resume usual physical activities or sports. °? Resume sexual activity. °· Do not drive home if you are discharged the same day as the procedure. Have someone else drive  you. °· You may drive 24 hours after the procedure unless otherwise instructed by your health care provider. °· Do not operate machinery or power tools for 24 hours after the procedure. °· If your procedure was done as an outpatient procedure, which means that you went home the same day as your procedure, a responsible adult should be with you for the first 24 hours after you arrive home. °· Keep all follow-up visits as directed by your health care provider. This is important. °Contact a health care provider if: °· You have a fever. °· You have chills. °· You have increased bleeding from the radial site. Hold pressure on the site. °Get help right away if: °· You have unusual pain at the radial site. °· You have redness, warmth, or swelling at the radial site. °· You have drainage (other than a small amount of blood on the dressing) from the radial site. °· The radial site is bleeding, and the bleeding does not stop after 30 minutes of holding steady pressure on the site. °· Your arm or hand becomes pale, cool, tingly, or numb. °This information is not intended to replace advice given to you by your health care provider. Make sure you discuss any questions you have with your health care provider. °Document Released: 05/19/2010 Document Revised: 09/22/2015 Document Reviewed: 11/02/2013 °Elsevier Interactive Patient Education © 2018 [employer] °Moderate Conscious Sedation, Adult, Care After °These instructions provide you with information about caring for yourself after your   procedure. Your health care provider may also give you more specific instructions. Your treatment has been planned according to current medical practices, but problems sometimes occur. Call your health care provider if you have any problems or questions after your procedure. °What can I expect after the procedure? °After your procedure, it is common: °· To feel sleepy for several hours. °· To feel clumsy and have poor balance for several  hours. °· To have poor judgment for several hours. °· To vomit if you eat too soon. ° °Follow these instructions at home: °For at least 24 hours after the procedure: ° °· Do not: °? Participate in activities where you could fall or become injured. °? Drive. °? Use heavy machinery. °? Drink alcohol. °? Take sleeping pills or medicines that cause drowsiness. °? Make important decisions or sign legal documents. °? Take care of children on your own. °· Rest. °Eating and drinking °· Follow the diet recommended by your health care provider. °· If you vomit: °? Drink water, juice, or soup when you can drink without vomiting. °? Make sure you have little or no nausea before eating solid foods. °General instructions °· Have a responsible adult stay with you until you are awake and alert. °· Take over-the-counter and prescription medicines only as told by your health care provider. °· If you smoke, do not smoke without supervision. °· Keep all follow-up visits as told by your health care provider. This is important. °Contact a health care provider if: °· You keep feeling nauseous or you keep vomiting. °· You feel light-headed. °· You develop a rash. °· You have a fever. °Get help right away if: °· You have trouble breathing. °This information is not intended to replace advice given to you by your health care provider. Make sure you discuss any questions you have with your health care provider. °Document Released: 02/04/2013 Document Revised: 09/19/2015 Document Reviewed: 08/06/2015 °Elsevier Interactive Patient Education © 2018 [employer] ° °

## 2017-12-17 ENCOUNTER — Encounter (HOSPITAL_COMMUNITY): Payer: Self-pay | Admitting: Interventional Cardiology

## 2017-12-17 DIAGNOSIS — I2510 Atherosclerotic heart disease of native coronary artery without angina pectoris: Secondary | ICD-10-CM | POA: Diagnosis not present

## 2017-12-27 ENCOUNTER — Ambulatory Visit: Payer: Medicaid Other | Admitting: Podiatry

## 2017-12-29 ENCOUNTER — Other Ambulatory Visit: Payer: Self-pay | Admitting: Cardiology

## 2017-12-31 ENCOUNTER — Other Ambulatory Visit: Payer: Medicaid Other | Admitting: *Deleted

## 2017-12-31 ENCOUNTER — Encounter (INDEPENDENT_AMBULATORY_CARE_PROVIDER_SITE_OTHER): Payer: Self-pay

## 2017-12-31 DIAGNOSIS — Z79899 Other long term (current) drug therapy: Secondary | ICD-10-CM

## 2017-12-31 DIAGNOSIS — I509 Heart failure, unspecified: Secondary | ICD-10-CM

## 2018-01-01 LAB — BASIC METABOLIC PANEL
BUN / CREAT RATIO: 13 (ref 10–24)
BUN: 25 mg/dL (ref 8–27)
CHLORIDE: 98 mmol/L (ref 96–106)
CO2: 31 mmol/L — ABNORMAL HIGH (ref 20–29)
Calcium: 9.7 mg/dL (ref 8.6–10.2)
Creatinine, Ser: 1.99 mg/dL — ABNORMAL HIGH (ref 0.76–1.27)
GFR calc non Af Amer: 35 mL/min/{1.73_m2} — ABNORMAL LOW (ref >59)
GFR, EST AFRICAN AMERICAN: 40 mL/min/{1.73_m2} — AB (ref >59)
Glucose: 114 mg/dL — ABNORMAL HIGH (ref 65–99)
POTASSIUM: 4.1 mmol/L (ref 3.5–5.2)
Sodium: 144 mmol/L (ref 134–144)

## 2018-01-03 ENCOUNTER — Ambulatory Visit: Payer: Medicaid Other | Admitting: Podiatry

## 2018-01-15 LAB — HM DIABETES EYE EXAM

## 2018-01-16 ENCOUNTER — Ambulatory Visit (INDEPENDENT_AMBULATORY_CARE_PROVIDER_SITE_OTHER): Payer: Medicaid Other | Admitting: Cardiology

## 2018-01-16 ENCOUNTER — Encounter: Payer: Self-pay | Admitting: Cardiology

## 2018-01-16 VITALS — BP 120/70 | HR 89 | Ht 70.0 in | Wt 328.8 lb

## 2018-01-16 DIAGNOSIS — I509 Heart failure, unspecified: Secondary | ICD-10-CM | POA: Diagnosis not present

## 2018-01-16 DIAGNOSIS — I10 Essential (primary) hypertension: Secondary | ICD-10-CM

## 2018-01-16 DIAGNOSIS — I152 Hypertension secondary to endocrine disorders: Secondary | ICD-10-CM

## 2018-01-16 DIAGNOSIS — I5020 Unspecified systolic (congestive) heart failure: Secondary | ICD-10-CM | POA: Diagnosis not present

## 2018-01-16 DIAGNOSIS — E1159 Type 2 diabetes mellitus with other circulatory complications: Secondary | ICD-10-CM

## 2018-01-16 DIAGNOSIS — Z79899 Other long term (current) drug therapy: Secondary | ICD-10-CM | POA: Diagnosis not present

## 2018-01-16 LAB — BASIC METABOLIC PANEL
BUN / CREAT RATIO: 16 (ref 10–24)
BUN: 31 mg/dL — ABNORMAL HIGH (ref 8–27)
CO2: 28 mmol/L (ref 20–29)
Calcium: 9.8 mg/dL (ref 8.6–10.2)
Chloride: 97 mmol/L (ref 96–106)
Creatinine, Ser: 1.95 mg/dL — ABNORMAL HIGH (ref 0.76–1.27)
GFR, EST AFRICAN AMERICAN: 41 mL/min/{1.73_m2} — AB (ref >59)
GFR, EST NON AFRICAN AMERICAN: 36 mL/min/{1.73_m2} — AB (ref >59)
Glucose: 176 mg/dL — ABNORMAL HIGH (ref 65–99)
POTASSIUM: 4.2 mmol/L (ref 3.5–5.2)
SODIUM: 143 mmol/L (ref 134–144)

## 2018-01-16 NOTE — Patient Instructions (Signed)
Medication Instructions:  The current medical regimen is effective;  continue present plan and medications.  Labwork: Please have blood work today. (BMP)  Follow-Up: Follow up with Nada Boozer, NP in 2 months and Dr Anne Fu in 4 months  If you need a refill on your cardiac medications before your next appointment, please call your pharmacy.  Thank you for choosing [facility]!!

## 2018-01-16 NOTE — Progress Notes (Signed)
Cardiology Office Note:    Date:  01/16/2018   ID:  Jeremiah Conway, DOB [birth year], MRN [id number]  PCP:  Ali Lowe, MD  Cardiologist:  No primary care provider on file.   Referring MD: Ali Lowe, MD     History of Present Illness:    Jeremiah Conway is a 62 y.o. male here for follow-up of chronic systolic heart failure at the request of Dr. Petra Kuba.  Has type 2 diabetes, hypertension, ejection fraction of 30 to 35%, obstructive sleep apnea, morbid obesity, chronic kidney disease stage III creatinine ranging from 1.4-1.7.  Diagnosed in May 2019 with systolic heart failure.  Presumed etiology from possible prior inferior wall MI indicated on ECG, obstructive sleep apnea, morbid obesity combination.  Echo also showed dilation of right atrium.  Has begun goal-directed therapy, spironolactone, metoprolol.  Also utilizes nasal cannula oxygen.  Weight has been an issue.  Has increased as an outpatient despite diuretics.  Cannot be on ACE inhibitor because of angioedema, no Entresto because of angioedema.  On metoprolol.  He was switched from Lasix 80 mg a day to torsemide 20 mg a day given his increase in weight gain.  BNP is 1800, increased.  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  SOB, new meds, may be helping. Before 20 ft. Out of breath. Does feel some chest fullness when pushing. No syncope. 2 months ago was sleeping sitting up. Now on 2pillow. Using O2. Supposed to take OSA test soon. Tries to stay away from salt. Drinks a lot of coffee.   01/16/18 - CC: here for CHF follow up. Cath was no CAD, well compensated.  Nonischemic cardiomyopathy wedge pressure 24.  Cardiac output 7 L.  Overall he is been feeling well.  He still continues to use home oxygen.  He follows the [employer].  No fevers chills nausea vomiting syncope bleeding.  Last creatinine 1.9 slightly elevated but he is on good diuretic regimen.  Past Medical History:  Diagnosis Date  . Allergic rhinitis    . Angioedema   . Bunion   . Diabetes mellitus   . History of hematuria 11/20/2007  . Hyperlipidemia   . Hypertension   . Hypokalemia   . Obesity   . OSA on CPAP   . Pericarditis 1988  . RBBB (right bundle branch block with left anterior fascicular block)   . Renal insufficiency    Cr 1.2 baseline  . Tobacco abuse    Stopped in 2002. Now smokes 1.5ppd  . Unspecified venous (peripheral) insufficiency     Past Surgical History:  Procedure Laterality Date  . fluid retention  1988   removed from abd  . MOUTH SURGERY N/A 07.08.2014  . RIGHT/LEFT HEART CATH AND CORONARY ANGIOGRAPHY N/A 12/16/2017   Procedure: RIGHT/LEFT HEART CATH AND CORONARY ANGIOGRAPHY;  Surgeon: Corky Crafts, MD;  Location: [facility] INVASIVE CV LAB;  Service: Cardiovascular;  Laterality: N/A;    Current Medications: Current Meds  Medication Sig  . albuterol (PROVENTIL HFA;VENTOLIN HFA) 108 (90 Base) MCG/ACT inhaler Inhale 1-2 puffs into the lungs every 6 (six) hours as needed for wheezing or shortness of breath.  Marland Kitchen aspirin 81 MG tablet Take 1 tablet (81 mg total) by mouth daily.  Marland Kitchen atorvastatin (LIPITOR) 80 MG tablet Take 80 mg by mouth daily.  . Elastic Bandages & Supports (V-5 HIGH COMPRESSION HOSE) MISC 1pair, Wear them while driving, walking, standing  . isosorbide-hydrALAZINE (BIDIL) 20-37.5 MG tablet Take 1 tablet by mouth  2 (two) times daily.  . metFORMIN (GLUCOPHAGE) 1000 MG tablet Take 1 tablet (1,000 mg total) by mouth 2 (two) times daily with a meal. TAKE 1 TABLET BY MOUTH TWICE DAILY WITH A MEAL  . metoprolol (TOPROL XL) 200 MG 24 hr tablet Take 1 tablet (200 mg total) by mouth daily.  . montelukast (SINGULAIR) 10 MG tablet Take 1 tablet (10 mg total) by mouth at bedtime.  Marland Kitchen oxymetazoline (AFRIN) 0.05 % nasal spray Place 1 spray into both nostrils 2 (two) times daily as needed for congestion.  Marland Kitchen spironolactone (ALDACTONE) 25 MG tablet Take 1 tablet (25 mg total) by mouth daily.  . tamsulosin  (FLOMAX) 0.4 MG CAPS capsule Take 0.4 mg by mouth daily after breakfast.  . torsemide (DEMADEX) 20 MG tablet TAKE 2 TABLETS(40 MG) BY MOUTH TWICE DAILY     Allergies:   Ace inhibitors and Angiotensin receptor blockers   Social History   Socioeconomic History  . Marital status: Divorced    Spouse name: Not on file  . Number of children: Not on file  . Years of education: Not on file  . Highest education level: Not on file  Occupational History  . Not on file  Social Needs  . Financial resource strain: Not on file  . Food insecurity:    Worry: Not on file    Inability: Not on file  . Transportation needs:    Medical: Not on file    Non-medical: Not on file  Tobacco Use  . Smoking status: Former Smoker    Packs/day: 0.50    Types: Cigarettes    Last attempt to quit: 06/10/2012    Years since quitting: 5.6  . Smokeless tobacco: Never Used  . Tobacco comment: given quit line info  Substance and Sexual Activity  . Alcohol use: Yes    Alcohol/week: 0.0 standard drinks    Comment: On holidays.  . Drug use: No  . Sexual activity: Not on file  Lifestyle  . Physical activity:    Days per week: Not on file    Minutes per session: Not on file  . Stress: Not on file  Relationships  . Social connections:    Talks on phone: Not on file    Gets together: Not on file    Attends religious service: Not on file    Active member of club or organization: Not on file    Attends meetings of clubs or organizations: Not on file    Relationship status: Not on file  Other Topics Concern  . Not on file  Social History Narrative   Works as a [employer].   Smokes 1.5 ppd.      Family History: The patient's family history includes Colon cancer in his maternal grandfather; Colon polyps in his maternal grandfather; Diabetes in his father; Heart disease in his father; Hypertension in his father and mother; Ovarian cancer in his maternal aunt.  ROS:   Please see the history of present  illness.     All other systems reviewed and are negative.  EKGs/Labs/Other Studies Reviewed:    The following studies were reviewed today:  ECHO 09/30/17:  - Left ventricle: Diffuse hypokinesis worse in the inferior wall.   The cavity size was mildly dilated. Wall thickness was increased   in a pattern of moderate LVH. Systolic function was moderately to   severely reduced. The estimated ejection fraction was in the   range of 30% to 35%. Left ventricular diastolic function  parameters were normal. - Aortic valve: There was mild regurgitation. - Mitral valve: There was mild regurgitation. - Left atrium: The atrium was moderately dilated. - Right ventricle: The cavity size was mildly dilated. - Right atrium: The atrium was mildly dilated. - Atrial septum: No defect or patent foramen ovale was identified. - Pulmonary arteries: PA peak pressure: 49 mm Hg (S).  Cath 12/16/17:  Mid LAD lesion is 10% stenosed.  LV end diastolic pressure is mildly elevated.  There is no aortic valve stenosis.  Hemodynamic findings consistent with moderate pulmonary hypertension.  No signficant CAD.  Continue medical therapy for nonischemic cardiomyopathy.  CO 7.7 L/min; CI 3.01; mean PA pressure 42 mm Hg; mean PCWP 24 mm Hg. Ao sat on RA 84%; PA sat 58%.     Continue medical therapy for nonischemic cardiomyopathy.  EKG:  EKG is  ordered today.  The ekg ordered today demonstrates 12/04/2017-92 sinus rhythm right bundle branch block, inferior infarct pattern.  Recent Labs: 09/17/2017: ALT 34 11/27/2017: B Natriuretic Peptide 1,824.1 12/12/2017: Hemoglobin 12.5; Platelets 244 12/31/2017: BUN 25; Creatinine, Ser 1.99; Potassium 4.1; Sodium 144  Recent Lipid Panel    Component Value Date/Time   CHOL 172 09/16/2013 1439   TRIG 162 (H) 09/16/2013 1439   HDL 34 (L) 09/16/2013 1439   CHOLHDL 5.1 09/16/2013 1439   VLDL 32 09/16/2013 1439   LDLCALC 106 (H) 09/16/2013 1439    Physical Exam:     VS:  BP 120/70   Pulse 89   Ht 5\' 10"  (1.778 m)   Wt (!) 328 lb 12.8 oz (149.1 kg)   SpO2 96%   BMI 47.18 kg/m     Wt Readings from Last 3 Encounters:  01/16/18 (!) 328 lb 12.8 oz (149.1 kg)  12/16/17 (!) 324 lb (147 kg)  12/12/17 (!) 324 lb (147 kg)     GEN: Well nourished, well developed, in no acute distress obese HEENT: normal O2 Neck: no JVD, carotid bruits, or masses Cardiac: RRR; no murmurs, rubs, or gallops,no edema  Respiratory:  clear to auscultation bilaterally, normal work of breathing GI: soft, nontender, nondistended, + BS MS: no deformity or atrophy  Skin: warm and dry, no rash Neuro:  Alert and Oriented x 3, Strength and sensation are intact Psych: euthymic mood, full affect   ASSESSMENT:    1. Systolic heart failure, unspecified HF chronicity (HCC)   2. Hypertension associated with diabetes (HCC)   3. High risk medication use   4. Congestive heart failure, unspecified HF chronicity, unspecified heart failure type (HCC)    PLAN:    In order of problems listed above:  Chronic systolic heart failure -EF 30 to 35% currently on metoprolol, spironolactone, torsemide.  Unable to take ACE inhibitor or angiotensin receptor blocker/Entresto because of prior angioedema.  Etiology of heart failure is nonischemic.  He is down total of about 40 pounds. -  Demadex, he seems to be urinating more, improved. Switched from Lasix  -  Demadex 40 mg twice a day.  1.5 L a day.  Less than 2 g salt.  Daily weights.  We will recheck.  Basic metabolic profile -No changes BiDil twice daily.  Morbid obesity -Continue to encourage weight loss.  40 pounds of fluid has been taken off at this point.  No changes today.  Diabetes with hypertension/obstructive sleep apnea/obesity hypoventilation syndrome/chronic kidney disease - Hemoglobin A1c 6.1.  Well-controlled.  Home oxygen. - Creatinine ranging from 1.4-1.9.  Right bundle branch block/inferior infarct pattern -Abnormal  EKG  noted.  No syncope.  Stable.  We will have him follow-up with Vernona Rieger in 2 months, me in 4 months  Medication Adjustments/Labs and Tests Ordered: Current medicines are reviewed at length with the patient today.  Concerns regarding medicines are outlined above.  Orders Placed This Encounter  Procedures  . Basic metabolic panel   No orders of the defined types were placed in this encounter.   Patient Instructions  Medication Instructions:  The current medical regimen is effective;  continue present plan and medications.  Labwork: Please have blood work today. (BMP)  Follow-Up: Follow up with Nada Boozer, NP in 2 months and Dr Anne Fu in 4 months  If you need a refill on your cardiac medications before your next appointment, please call your pharmacy.  Thank you for choosing [facility]!!        Signed, Donato Schultz, MD  01/16/2018 10:45 AM    [facility]

## 2018-01-18 ENCOUNTER — Other Ambulatory Visit: Payer: Self-pay | Admitting: Internal Medicine

## 2018-01-18 DIAGNOSIS — I509 Heart failure, unspecified: Secondary | ICD-10-CM

## 2018-01-27 ENCOUNTER — Other Ambulatory Visit: Payer: Self-pay | Admitting: Cardiology

## 2018-02-04 ENCOUNTER — Encounter: Payer: Self-pay | Admitting: *Deleted

## 2018-02-08 ENCOUNTER — Other Ambulatory Visit: Payer: Self-pay | Admitting: Internal Medicine

## 2018-02-14 ENCOUNTER — Other Ambulatory Visit: Payer: Self-pay | Admitting: Internal Medicine

## 2018-02-20 ENCOUNTER — Other Ambulatory Visit: Payer: Self-pay | Admitting: Internal Medicine

## 2018-02-20 DIAGNOSIS — I5020 Unspecified systolic (congestive) heart failure: Secondary | ICD-10-CM

## 2018-02-21 ENCOUNTER — Ambulatory Visit: Payer: Medicaid Other | Admitting: Podiatry

## 2018-02-21 DIAGNOSIS — M79674 Pain in right toe(s): Secondary | ICD-10-CM

## 2018-02-21 DIAGNOSIS — B351 Tinea unguium: Secondary | ICD-10-CM

## 2018-02-21 DIAGNOSIS — E0822 Diabetes mellitus due to underlying condition with diabetic chronic kidney disease: Secondary | ICD-10-CM | POA: Diagnosis not present

## 2018-02-21 DIAGNOSIS — M79675 Pain in left toe(s): Secondary | ICD-10-CM | POA: Diagnosis not present

## 2018-02-21 DIAGNOSIS — N183 Chronic kidney disease, stage 3 unspecified: Secondary | ICD-10-CM

## 2018-03-02 ENCOUNTER — Other Ambulatory Visit: Payer: Self-pay | Admitting: Internal Medicine

## 2018-03-14 NOTE — Progress Notes (Signed)
Cardiology Office Note   Date:  03/18/2018   ID:  Jeremiah Conway, DOB 1955/08/11, MRN [id number]  PCP:  Jeremiah Lowe, MD  Cardiologist:  Dr. Anne Conway    Chief Complaint  Patient presents with  . Congestive Heart Failure      History of Present Illness: Jeremiah Conway is a 62 y.o. male who presents for CHF.   Has type 2 diabetes, hypertension, ejection fraction of 30 to 35%, obstructive sleep apnea, morbid obesity, chronic kidney disease stage III creatinine ranging from 1.4-1.7.  Diagnosed in May 2019 with systolic heart failure.  Presumed etiology from possible prior inferior wall MI indicated on ECG, obstructive sleep apnea, morbid obesity combination.  Echo also showed dilation of right atrium.  Has begun goal-directed therapy, spironolactone, metoprolol.  Also utilizes nasal cannula oxygen.  Weight has been an issue.  Has increased as an outpatient despite diuretics.  Cannot be on ACE inhibitor because of angioedema, no Entresto because of angioedema.  On metoprolol.  He was switched from Lasix 80 mg a day to torsemide 20 mg a day given his increase in weight gain.  BNP is 1800, increased.  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   SOB, new meds, may be helping. Before 20 ft. Out of breath. Does feel some chest fullness when pushing. No syncope. 2 months ago was sleeping sitting up. Now on 2pillow. Using O2. Supposed to take OSA test soon. Tries to stay away from salt.  Mid LAD lesion is 10% stenosed.  LV end diastolic pressure is mildly elevated.  There is no aortic valve stenosis.  Hemodynamic findings consistent with moderate pulmonary hypertension.  No signficant CAD.  Continue medical therapy for nonischemic cardiomyopathy.  CO 7.7 L/min; CI 3.01; mean PA pressure 42 mm Hg; mean PCWP 24 mm Hg. Ao sat on RA 84%; PA sat 58%.   He is on continued medical therapy for nonischemic cardiomyopathy. Demadex, he seems to be urinating more, improved.  Switched from Lasix  -  Demadex 40 mg twice a day.  1.5 L a day.  Less than 2 g salt.  Daily weights.   -No changes BiDil twice daily.  Today he feels great no significant SOB and no chest pain.  He is wearing his oxygen 24/7.  Watches his salt.  He is wearing his support stockings. No bleeding.  He has been called to jury duty.  This would be difficult needing to take diuretic with freq urination and need for his oxygen.     Past Medical History:  Diagnosis Date  . Allergic rhinitis   . Angioedema   . Bunion   . Diabetes mellitus   . History of hematuria 11/20/2007  . Hyperlipidemia   . Hypertension   . Hypokalemia   . Obesity   . OSA on CPAP   . Pericarditis 1988  . RBBB (right bundle branch block with left anterior fascicular block)   . Renal insufficiency    Cr 1.2 baseline  . Tobacco abuse    Stopped in 2002. Now smokes 1.5ppd  . Unspecified venous (peripheral) insufficiency     Past Surgical History:  Procedure Laterality Date  . fluid retention  1988   removed from abd  . MOUTH SURGERY N/A 07.08.2014  . RIGHT/LEFT HEART CATH AND CORONARY ANGIOGRAPHY N/A 12/16/2017   Procedure: RIGHT/LEFT HEART CATH AND CORONARY ANGIOGRAPHY;  Surgeon: Jeremiah Crafts, MD;  Location: [facility];  Service: Cardiovascular;  Laterality: N/A;  Current Outpatient Medications  Medication Sig Dispense Refill  . albuterol (PROVENTIL HFA;VENTOLIN HFA) 108 (90 Base) MCG/ACT inhaler Inhale 1-2 puffs into the lungs every 6 (six) hours as needed for wheezing or shortness of breath. 1 Inhaler 3  . aspirin 81 MG tablet Take 1 tablet (81 mg total) by mouth daily. 30 tablet 11  . atorvastatin (LIPITOR) 80 MG tablet Take 80 mg by mouth daily.    . Elastic Bandages & Supports (V-5 HIGH COMPRESSION HOSE) MISC 1pair, Wear them while driving, walking, standing 1 each 1  . isosorbide-hydrALAZINE (BIDIL) 20-37.5 MG tablet Take 1 tablet by mouth 2 (two) times daily. 60 tablet 6  . metFORMIN  (GLUCOPHAGE) 1000 MG tablet Take 1 tablet (1,000 mg total) by mouth 2 (two) times daily with a meal. TAKE 1 TABLET BY MOUTH TWICE DAILY WITH A MEAL    . metoprolol (TOPROL XL) 200 MG 24 hr tablet Take 1 tablet (200 mg total) by mouth daily. 90 tablet 3  . montelukast (SINGULAIR) 10 MG tablet Take 1 tablet (10 mg total) by mouth at bedtime. 90 tablet 3  . oxymetazoline (AFRIN) 0.05 % nasal spray Place 1 spray into both nostrils 2 (two) times daily as needed for congestion.    Marland Kitchen spironolactone (ALDACTONE) 25 MG tablet Take 1 tablet (25 mg total) by mouth daily. 90 tablet 3  . tamsulosin (FLOMAX) 0.4 MG CAPS capsule Take 0.4 mg by mouth daily after breakfast.    . torsemide (DEMADEX) 20 MG tablet TAKE 2 TABLETS(40 MG) BY MOUTH TWICE DAILY 360 tablet 3   No current facility-administered medications for this visit.     Allergies:   Ace inhibitors and Angiotensin receptor blockers    Social History:  The patient  reports that he quit smoking about 5 years ago. His smoking use included cigarettes. He smoked 0.50 packs per day. He has never used smokeless tobacco. He reports that he drinks alcohol. He reports that he does not use drugs.   Family History:  The patient's family history includes Colon cancer in his maternal grandfather; Colon polyps in his maternal grandfather; Diabetes in his father; Heart disease in his father; Hypertension in his father and mother; Ovarian cancer in his maternal aunt.    ROS:  General:no colds or fevers, no weight changes Skin:no rashes or ulcers HEENT:no blurred vision, no congestion CV:see HPI PUL:see HPI GI:no diarrhea constipation or melena, no indigestion GU:no hematuria, no dysuria MS:no joint pain, no claudication Neuro:no syncope, no lightheadedness Endo:+ diabetes- stable, no thyroid disease  Wt Readings from Last 3 Encounters:  03/17/18 (!) 326 lb 1.9 oz (147.9 kg)  01/16/18 (!) 328 lb 12.8 oz (149.1 kg)  12/16/17 (!) 324 lb (147 kg)      PHYSICAL EXAM: VS:  BP 100/66   Pulse 93   Ht 5\' 10"  (1.778 m)   Wt (!) 326 lb 1.9 oz (147.9 kg)   SpO2 95%   BMI 46.79 kg/m  , BMI Body mass index is 46.79 kg/m. General:Pleasant affect, NAD Skin:Warm and dry, brisk capillary refill HEENT:normocephalic, sclera clear, mucus membranes moist, oxygen in place Neck:supple, no JVD sitting up right, no bruits  Heart:S1S2 RRR without murmur, gallup, rub or click Lungs:clear without rales, rhonchi, or wheezes GNF:AOZH, non tender, + BS, do not palpate liver spleen or masses Ext:no to trace lower ext edema, 2+ pedal pulses, 2+ radial pulses Neuro:alert and oriented X 3, MAE, follows commands, + facial symmetry    EKG:  EKG is NOT  ordered today.   Recent Labs: 09/17/2017: ALT 34 11/27/2017: B Natriuretic Peptide 1,824.1 12/12/2017: Hemoglobin 12.5; Platelets 244 03/17/2018: BUN 28; Creatinine, Ser 2.09; Potassium 4.3; Sodium 142    Lipid Panel    Component Value Date/Time   CHOL 172 09/16/2013 1439   TRIG 162 (H) 09/16/2013 1439   HDL 34 (L) 09/16/2013 1439   CHOLHDL 5.1 09/16/2013 1439   VLDL 32 09/16/2013 1439   LDLCALC 106 (H) 09/16/2013 1439       Other studies Reviewed: Additional studies/ records that were reviewed today include: . Cardiac cath see above Echo 09/30/17 Study Conclusions  - Left ventricle: Diffuse hypokinesis worse in the inferior wall.   The cavity size was mildly dilated. Wall thickness was increased   in a pattern of moderate LVH. Systolic function was moderately to   severely reduced. The estimated ejection fraction was in the   range of 30% to 35%. Left ventricular diastolic function   parameters were normal. - Aortic valve: There was mild regurgitation. - Mitral valve: There was mild regurgitation. - Left atrium: The atrium was moderately dilated. - Right ventricle: The cavity size was mildly dilated. - Right atrium: The atrium was mildly dilated. - Atrial septum: No defect or patent  foramen ovale was identified. - Pulmonary arteries: PA peak pressure: 49 mm Hg (S).  ASSESSMENT AND PLAN:  1.  Chronic systolic HF, NICM unable to add ARB/ACE entresto due to CKD and agnioedema.  He is euvolemic today.  He is feeling well and not using salt.  Wears support stockings. Torsemide has worked well  Follow up with Dr. Anne Conway in Jan.  2.  Hypoxia on chronic oxygen.  With both HF and hypoxia I wrote letter asking pt to be excused from [employer] duty.    3.  HTN controlled to lower.  Continue BB and BiDil  4.  Chronic RBBB.    5.  High risk medication use.  6.  Diabetes followed by PCP   Current medicines are reviewed with the patient today.  The patient Has no concerns regarding medicines.  The following changes have been made:  See above Labs/ tests ordered today include:see above  Disposition:   [place]:  see above  Signed, Nada Boozer, NP  03/18/2018 9:41 PM    [facility] [address] [place], [place], [place]  [address]/ [address] [place], [place] Phone: [phone]; Fax: [phone]  [phone]

## 2018-03-15 ENCOUNTER — Encounter: Payer: Self-pay | Admitting: Podiatry

## 2018-03-15 NOTE — Progress Notes (Signed)
Subjective: ALEXX GAYDEN presents to clinic for diabetic foot care check.  He is seen for painful mycotic nails.  He states his nails are long and causing pain in his shoes.    He voices no new pedal concerns on today's visit.  Objective: Vascular Examination: Capillary refill time is less than 3 seconds all 10 digits Dorsalis pedis and posterior tibial pulses are present bilaterally No digital hair x10 digits Skin temperature gradient is within normal limits bilaterally There is a trace pedal edema noted bilaterally.  Dermatological Examination: Skin with normal turgor texture and tone bilaterally Toenails 1 through 5 bilaterally are elongated, discolored, thick, and dystrophic with subungual debris.  There is pain with dorsal palpation.  Musculoskeletal: Muscle strength 5/5 to all LE muscle groups Pes planus foot deformity bilaterally Laterally deviated hallux with medial deviation of first metatarsal head consistent with HAV deformity bilateral  Neurological: Sensation intact with 10 gram monofilament. Vibratory sensation intact.  Assessment: 1. Painful onychomycosis toenails 1-5 b/l  2. NIDDM with chronic kidney disease stage III 3. Hallux abductovalgus with bunion deformity bilaterally  Plan: 1. Toenails 1-5 b/l were debrided in length and girth without iatrogenic bleeding. 2. Patient to continue soft, supportive shoe gear 3. Patient to report any pedal injuries to medical professional immediately. 4. Follow up 3 months. Patient/POA to call should there be a concern in the interim.

## 2018-03-17 ENCOUNTER — Ambulatory Visit: Payer: Medicaid Other | Admitting: Cardiology

## 2018-03-17 ENCOUNTER — Encounter: Payer: Self-pay | Admitting: Cardiology

## 2018-03-17 VITALS — BP 100/66 | HR 93 | Ht 70.0 in | Wt 326.1 lb

## 2018-03-17 DIAGNOSIS — R0902 Hypoxemia: Secondary | ICD-10-CM

## 2018-03-17 DIAGNOSIS — I509 Heart failure, unspecified: Secondary | ICD-10-CM

## 2018-03-17 DIAGNOSIS — I10 Essential (primary) hypertension: Secondary | ICD-10-CM

## 2018-03-17 DIAGNOSIS — Z9981 Dependence on supplemental oxygen: Secondary | ICD-10-CM

## 2018-03-17 DIAGNOSIS — Z79899 Other long term (current) drug therapy: Secondary | ICD-10-CM | POA: Diagnosis not present

## 2018-03-17 DIAGNOSIS — I5020 Unspecified systolic (congestive) heart failure: Secondary | ICD-10-CM | POA: Diagnosis not present

## 2018-03-17 DIAGNOSIS — E1159 Type 2 diabetes mellitus with other circulatory complications: Secondary | ICD-10-CM

## 2018-03-17 DIAGNOSIS — I152 Hypertension secondary to endocrine disorders: Secondary | ICD-10-CM

## 2018-03-17 LAB — BASIC METABOLIC PANEL
BUN/Creatinine Ratio: 13 (ref 10–24)
BUN: 28 mg/dL — ABNORMAL HIGH (ref 8–27)
CO2: 28 mmol/L (ref 20–29)
Calcium: 9.5 mg/dL (ref 8.6–10.2)
Chloride: 96 mmol/L (ref 96–106)
Creatinine, Ser: 2.09 mg/dL — ABNORMAL HIGH (ref 0.76–1.27)
GFR calc Af Amer: 38 mL/min/{1.73_m2} — ABNORMAL LOW (ref >59)
GFR, EST NON AFRICAN AMERICAN: 33 mL/min/{1.73_m2} — AB (ref >59)
Glucose: 157 mg/dL — ABNORMAL HIGH (ref 65–99)
POTASSIUM: 4.3 mmol/L (ref 3.5–5.2)
Sodium: 142 mmol/L (ref 134–144)

## 2018-03-17 NOTE — Patient Instructions (Signed)
Medication Instructions:  Your physician recommends that you continue on your current medications as directed. Please refer to the Current Medication list given to you today.  If you need a refill on your cardiac medications before your next appointment, please call your pharmacy.   Lab work: TODAY: BMET  If you have labs (blood work) drawn today and your tests are completely normal, you will receive your results only by: Marland Kitchen MyChart Message (if you have MyChart) OR . A paper copy in the mail If you have any lab test that is abnormal or we need to change your treatment, we will call you to review the results.  Testing/Procedures: None  Follow-Up: Keep your follow up appointment with Dr. Anne Fu on 05/20/2018 @ 11:20 AM  Any Other Special Instructions Will Be Listed Below (If Applicable).

## 2018-03-18 ENCOUNTER — Encounter: Payer: Self-pay | Admitting: Cardiology

## 2018-03-19 ENCOUNTER — Telehealth: Payer: Self-pay

## 2018-03-19 DIAGNOSIS — R609 Edema, unspecified: Secondary | ICD-10-CM

## 2018-03-19 MED ORDER — TORSEMIDE 20 MG PO TABS: ORAL_TABLET | ORAL | 3 refills | Status: DC

## 2018-03-19 NOTE — Telephone Encounter (Signed)
-----   Message from Leone Brand, NP sent at 03/18/2018  9:08 PM EST ----- Decrease torsemide to 40 mg in am and 20 mg in PM  Recheck BMP in 1 weeks, monitor for edema.  Kidney function is stressed, so decreasing diuretic some may help.

## 2018-03-19 NOTE — Telephone Encounter (Signed)
Spoke to patient and informed him of lab results and recommendations.  He verbalized understanding of Torsemide change and lab work to be done 11/27.

## 2018-03-19 NOTE — Telephone Encounter (Signed)
Follow up: ° ° °Patient returning call back. Please call patient. °

## 2018-03-19 NOTE — Telephone Encounter (Signed)
Notes recorded by Sigurd Sos, RN on 03/19/2018 at 8:57 AM EST [email or web address] 11/20 ------

## 2018-03-26 ENCOUNTER — Other Ambulatory Visit: Payer: Medicaid Other | Admitting: *Deleted

## 2018-03-26 DIAGNOSIS — R609 Edema, unspecified: Secondary | ICD-10-CM

## 2018-03-26 LAB — BASIC METABOLIC PANEL
BUN/Creatinine Ratio: 12 (ref 10–24)
BUN: 23 mg/dL (ref 8–27)
CALCIUM: 9.6 mg/dL (ref 8.6–10.2)
CO2: 25 mmol/L (ref 20–29)
Chloride: 98 mmol/L (ref 96–106)
Creatinine, Ser: 2 mg/dL — ABNORMAL HIGH (ref 0.76–1.27)
GFR calc Af Amer: 40 mL/min/{1.73_m2} — ABNORMAL LOW (ref >59)
GFR, EST NON AFRICAN AMERICAN: 35 mL/min/{1.73_m2} — AB (ref >59)
Glucose: 133 mg/dL — ABNORMAL HIGH (ref 65–99)
POTASSIUM: 3.7 mmol/L (ref 3.5–5.2)
Sodium: 142 mmol/L (ref 134–144)

## 2018-03-31 ENCOUNTER — Telehealth: Payer: Self-pay

## 2018-03-31 NOTE — Telephone Encounter (Signed)
Notes recorded by Sigurd Sos, RN on 03/31/2018 at 8:32 AM EST The patient has been notified of the result and verbalized understanding. All questions (if any) were answered. Sigurd Sos, RN 03/31/2018 8:32 AM   ------

## 2018-03-31 NOTE — Telephone Encounter (Signed)
-----   Message from Darrol Jump, PA-C sent at 03/28/2018 11:50 AM EST ----- Please let him know his renal function is about the same. Electrolytes are ok. Make sure his weights are stable and breathing is at baseline. Keep f/u appt. Thanks

## 2018-04-01 ENCOUNTER — Telehealth: Payer: Self-pay | Admitting: Cardiology

## 2018-04-01 NOTE — Telephone Encounter (Signed)
Error no note needed °

## 2018-04-02 ENCOUNTER — Telehealth: Payer: Self-pay | Admitting: Cardiology

## 2018-04-02 NOTE — Telephone Encounter (Signed)
New Message:   Derrill Kay  respironicsCalling concerning about some paper work that needs to [phone].

## 2018-04-02 NOTE — Telephone Encounter (Signed)
Called the number listed in this call and it is an offer for "free towing and oil changes" If this call is in  reference to Sleep studies/apnea products or 02 it would need to be addressed by the MD who ordered.  Dr. Anne Fu has not and did not order anything for sleep apnea or oxygen for this patient. It appears according to the chart Dr. Chana Bode with internal medicine ordered 02.

## 2018-05-20 ENCOUNTER — Encounter (INDEPENDENT_AMBULATORY_CARE_PROVIDER_SITE_OTHER): Payer: Self-pay

## 2018-05-20 ENCOUNTER — Encounter: Payer: Self-pay | Admitting: Cardiology

## 2018-05-20 ENCOUNTER — Ambulatory Visit (INDEPENDENT_AMBULATORY_CARE_PROVIDER_SITE_OTHER): Payer: Medicaid Other | Admitting: Cardiology

## 2018-05-20 VITALS — BP 114/70 | HR 96 | Ht 70.0 in | Wt 324.2 lb

## 2018-05-20 DIAGNOSIS — I5020 Unspecified systolic (congestive) heart failure: Secondary | ICD-10-CM

## 2018-05-20 DIAGNOSIS — I10 Essential (primary) hypertension: Secondary | ICD-10-CM

## 2018-05-20 NOTE — Patient Instructions (Signed)
Medication Instructions:  The current medical regimen is effective;  continue present plan and medications.  If you need a refill on your cardiac medications before your next appointment, please call your pharmacy.   Follow-Up: At [facility], you and your health needs are our priority.  As part of our continuing mission to provide you with exceptional heart care, we have created designated Provider Care Teams.  These Care Teams include your primary Cardiologist (physician) and Advanced Practice Providers (APPs -  Physician Assistants and Nurse Practitioners) who all work together to provide you with the care you need, when you need it. You will need a follow up appointment in 4 months with Nada Boozer, NP and 8 months with Dr Anne Fu.  Please call our office 2 months in advance to schedule this appointment.  You may see Donato Schultz, MD or one of the following Advanced Practice Providers on your designated Care Team:   Norma Fredrickson, NP Nada Boozer, NP . Georgie Chard, NP  Thank you for choosing [facility]!!

## 2018-05-20 NOTE — Progress Notes (Signed)
Cardiology Office Note:    Date:  05/20/2018   ID:  Jeremiah Conway, DOB 1955-11-06, MRN [id number]  PCP:  Ali Lowe, MD  Cardiologist:  Donato Schultz, MD   Referring MD: Ali Lowe, MD     History of Present Illness:    Jeremiah Conway is a 63 y.o. male here for follow-up of chronic systolic heart failure at the request of Dr. Petra Kuba.  Has type 2 diabetes, hypertension, ejection fraction of 30 to 35%, obstructive sleep apnea, morbid obesity, chronic kidney disease stage III creatinine ranging aound 2.  Diagnosed in May 2019 with systolic heart failure.  Presumed etiology from possible prior inferior wall MI indicated on ECG, obstructive sleep apnea, morbid obesity combination.  Echo also showed dilation of right atrium.  Has begun goal-directed therapy, spironolactone, metoprolol.  Also utilizes nasal cannula oxygen.  Weight has been an issue.  Has increased as an outpatient despite diuretics.  Cannot be on ACE inhibitor because of angioedema, no Entresto because of angioedema.  On metoprolol.  He was switched from Lasix 80 mg a day to torsemide 20 mg a day given his increase in weight gain.  BNP is 1800, increased.  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  SOB, new meds, may be helping. Before 20 ft. Out of breath. Does feel some chest fullness when pushing. No syncope. 2 months ago was sleeping sitting up. Now on 2pillow. Using O2. Supposed to take OSA test soon. Tries to stay away from salt. Drinks a lot of coffee.   01/16/18 - CC: here for CHF follow up. Cath was no CAD, well compensated.  Nonischemic cardiomyopathy wedge pressure 24.  Cardiac output 7 L.  Overall he is been feeling well.  He still continues to use home oxygen.  He follows the [employer].  No fevers chills nausea vomiting syncope bleeding.  Last creatinine 1.9 slightly elevated but he is on good diuretic regimen.  05/20/2018-chief complaint here for heart failure follow-up.  We once again  reiterated his fluid intake to 1.5 L a day.  His Demadex was decreased to 40/20.  Renal function shows kidneys with a creatinine of about 2.  Wearing home oxygen.  Catheterization again no CAD.  Overall he has been feeling quite well.  Stable.  No significant change in shortness of breath.  No fevers chills nausea vomiting syncope.  Past Medical History:  Diagnosis Date  . Allergic rhinitis   . Angioedema   . Bunion   . Diabetes mellitus   . History of hematuria 11/20/2007  . Hyperlipidemia   . Hypertension   . Hypokalemia   . Obesity   . OSA on CPAP   . Pericarditis 1988  . RBBB (right bundle branch block with left anterior fascicular block)   . Renal insufficiency    Cr 1.2 baseline  . Tobacco abuse    Stopped in 2002. Now smokes 1.5ppd  . Unspecified venous (peripheral) insufficiency     Past Surgical History:  Procedure Laterality Date  . fluid retention  1988   removed from abd  . MOUTH SURGERY N/A 07.08.2014  . RIGHT/LEFT HEART CATH AND CORONARY ANGIOGRAPHY N/A 12/16/2017   Procedure: RIGHT/LEFT HEART CATH AND CORONARY ANGIOGRAPHY;  Surgeon: Corky Crafts, MD;  Location: [facility] INVASIVE CV LAB;  Service: Cardiovascular;  Laterality: N/A;    Current Medications: Current Meds  Medication Sig  . albuterol (PROVENTIL HFA;VENTOLIN HFA) 108 (90 Base) MCG/ACT inhaler Inhale 1-2 puffs into the lungs  every 6 (six) hours as needed for wheezing or shortness of breath.  Marland Kitchen aspirin 81 MG tablet Take 1 tablet (81 mg total) by mouth daily.  Marland Kitchen atorvastatin (LIPITOR) 80 MG tablet Take 80 mg by mouth daily.  . Elastic Bandages & Supports (V-5 HIGH COMPRESSION HOSE) MISC 1pair, Wear them while driving, walking, standing  . isosorbide-hydrALAZINE (BIDIL) 20-37.5 MG tablet Take 1 tablet by mouth 2 (two) times daily.  . metFORMIN (GLUCOPHAGE) 1000 MG tablet Take 1 tablet (1,000 mg total) by mouth 2 (two) times daily with a meal. TAKE 1 TABLET BY MOUTH TWICE DAILY WITH A MEAL  . metoprolol  (TOPROL XL) 200 MG 24 hr tablet Take 1 tablet (200 mg total) by mouth daily.  . montelukast (SINGULAIR) 10 MG tablet Take 1 tablet (10 mg total) by mouth at bedtime.  Marland Kitchen oxymetazoline (AFRIN) 0.05 % nasal spray Place 1 spray into both nostrils 2 (two) times daily as needed for congestion.  Marland Kitchen spironolactone (ALDACTONE) 25 MG tablet Take 1 tablet (25 mg total) by mouth daily.  . tamsulosin (FLOMAX) 0.4 MG CAPS capsule Take 0.4 mg by mouth daily after breakfast.  . torsemide (DEMADEX) 20 MG tablet Take 2 x 20 mg tablets (40 mg total) in the morning and take 1 x 20 mg tablet in the evening.     Allergies:   Ace inhibitors and Angiotensin receptor blockers   Social History   Socioeconomic History  . Marital status: Divorced    Spouse name: Not on file  . Number of children: Not on file  . Years of education: Not on file  . Highest education level: Not on file  Occupational History  . Not on file  Social Needs  . Financial resource strain: Not on file  . Food insecurity:    Worry: Not on file    Inability: Not on file  . Transportation needs:    Medical: Not on file    Non-medical: Not on file  Tobacco Use  . Smoking status: Former Smoker    Packs/day: 0.50    Types: Cigarettes    Last attempt to quit: 06/10/2012    Years since quitting: 5.9  . Smokeless tobacco: Never Used  . Tobacco comment: given quit line info  Substance and Sexual Activity  . Alcohol use: Yes    Alcohol/week: 0.0 standard drinks    Comment: On holidays.  . Drug use: No  . Sexual activity: Not on file  Lifestyle  . Physical activity:    Days per week: Not on file    Minutes per session: Not on file  . Stress: Not on file  Relationships  . Social connections:    Talks on phone: Not on file    Gets together: Not on file    Attends religious service: Not on file    Active member of club or organization: Not on file    Attends meetings of clubs or organizations: Not on file    Relationship status: Not on  file  Other Topics Concern  . Not on file  Social History Narrative   Works as a [employer] architect.   Smokes 1.5 ppd.      Family History: The patient's family history includes Colon cancer in his maternal grandfather; Colon polyps in his maternal grandfather; Diabetes in his father; Heart disease in his father; Hypertension in his father and mother; Ovarian cancer in his maternal aunt.  ROS:   Please see the history of present illness.  All other systems reviewed and are negative.  EKGs/Labs/Other Studies Reviewed:    The following studies were reviewed today:  ECHO 09/30/17:  - Left ventricle: Diffuse hypokinesis worse in the inferior wall.   The cavity size was mildly dilated. Wall thickness was increased   in a pattern of moderate LVH. Systolic function was moderately to   severely reduced. The estimated ejection fraction was in the   range of 30% to 35%. Left ventricular diastolic function   parameters were normal. - Aortic valve: There was mild regurgitation. - Mitral valve: There was mild regurgitation. - Left atrium: The atrium was moderately dilated. - Right ventricle: The cavity size was mildly dilated. - Right atrium: The atrium was mildly dilated. - Atrial septum: No defect or patent foramen ovale was identified. - Pulmonary arteries: PA peak pressure: 49 mm Hg (S).  Cath 12/16/17:  Mid LAD lesion is 10% stenosed.  LV end diastolic pressure is mildly elevated.  There is no aortic valve stenosis.  Hemodynamic findings consistent with moderate pulmonary hypertension.  No signficant CAD.  Continue medical therapy for nonischemic cardiomyopathy.  CO 7.7 L/min; CI 3.01; mean PA pressure 42 mm Hg; mean PCWP 24 mm Hg. Ao sat on RA 84%; PA sat 58%.     Continue medical therapy for nonischemic cardiomyopathy.  EKG:  EKG is  ordered today.  The ekg ordered today demonstrates 12/04/2017-92 sinus rhythm right bundle branch block, inferior infarct pattern.  Recent  Labs: 09/17/2017: ALT 34 11/27/2017: B Natriuretic Peptide 1,824.1 12/12/2017: Hemoglobin 12.5; Platelets 244 03/26/2018: BUN 23; Creatinine, Ser 2.00; Potassium 3.7; Sodium 142  Recent Lipid Panel    Component Value Date/Time   CHOL 172 09/16/2013 1439   TRIG 162 (H) 09/16/2013 1439   HDL 34 (L) 09/16/2013 1439   CHOLHDL 5.1 09/16/2013 1439   VLDL 32 09/16/2013 1439   LDLCALC 106 (H) 09/16/2013 1439    Physical Exam:    VS:  BP 114/70   Pulse 96   Ht 5\' 10"  (1.778 m)   Wt (!) 324 lb 3.2 oz (147.1 kg)   SpO2 93%   BMI 46.52 kg/m     Wt Readings from Last 3 Encounters:  05/20/18 (!) 324 lb 3.2 oz (147.1 kg)  03/17/18 (!) 326 lb 1.9 oz (147.9 kg)  01/16/18 (!) 328 lb 12.8 oz (149.1 kg)     GEN: Well nourished, well developed, in no acute distress obese HEENT: normal  Neck: no JVD, carotid bruits, or masses Cardiac: RRR; no murmurs, rubs, or gallops,no edema  Respiratory:  clear to auscultation bilaterally, normal work of breathing GI: soft, nontender, nondistended, + BS MS: no deformity or atrophy  Skin: warm and dry, no rash Neuro:  Alert and Oriented x 3, Strength and sensation are intact Psych: euthymic mood, full affect    ASSESSMENT:    1. Systolic heart failure, unspecified HF chronicity (HCC)   2. Morbid obesity (HCC)   3. Essential hypertension    PLAN:    In order of problems listed above:  Chronic systolic heart failure -EF 30 to 35% currently on metoprolol, spironolactone, torsemide.  Unable to take ACE inhibitor or angiotensin receptor blocker/Entresto because of prior angioedema.  Etiology of heart failure is nonischemic.  He is down total of about 40 pounds. -  Demadex, he seems to be urinating more, improved. Switched from Lasix  -  Demadex 40 mg in the morning and 20 mg in the evening (this was decreased because of his constant  creatinine of about 1.9-2.0).  1.5 L a day.  Reiterated this to him again today.  Less than 2 g salt.  Daily weights.     -No changes BiDil twice daily.  Morbid obesity -Continue to encourage weight loss.  40 pounds of fluid has been taken off at this point.  No changes today.  Weight has been stable.  Diabetes with hypertension/obstructive sleep apnea/obesity hypoventilation syndrome/chronic kidney disease - Hemoglobin A1c 6.1.  Well-controlled.  Home oxygen. - Creatinine ranging from 1.4-1.9.  I think his new baseline will be 2.  Right bundle branch block/inferior infarct pattern -Abnormal EKG noted.  No syncope.  Stable.  No changes made.  We will have him follow-up with Vernona Rieger in 4 months, me in 8 months  Medication Adjustments/Labs and Tests Ordered: Current medicines are reviewed at length with the patient today.  Concerns regarding medicines are outlined above.  No orders of the defined types were placed in this encounter.  No orders of the defined types were placed in this encounter.   Patient Instructions  Medication Instructions:  The current medical regimen is effective;  continue present plan and medications.  If you need a refill on your cardiac medications before your next appointment, please call your pharmacy.   Follow-Up: At [facility], you and your health needs are our priority.  As part of our continuing mission to provide you with exceptional heart care, we have created designated Provider Care Teams.  These Care Teams include your primary Cardiologist (physician) and Advanced Practice Providers (APPs -  Physician Assistants and Nurse Practitioners) who all work together to provide you with the care you need, when you need it. You will need a follow up appointment in 4 months with Nada Boozer, NP and 8 months with Dr Anne Fu.  Please call our office 2 months in advance to schedule this appointment.  You may see Donato Schultz, MD or one of the following Advanced Practice Providers on your designated Care Team:   Norma Fredrickson, NP Nada Boozer, NP . Georgie Chard, NP  Thank you for  choosing [facility]!!          Signed, Donato Schultz, MD  05/20/2018 12:07 PM    [facility]

## 2018-05-27 ENCOUNTER — Encounter: Payer: Self-pay | Admitting: Podiatry

## 2018-05-27 ENCOUNTER — Ambulatory Visit: Payer: Medicaid Other | Admitting: Podiatry

## 2018-05-27 DIAGNOSIS — B351 Tinea unguium: Secondary | ICD-10-CM | POA: Diagnosis not present

## 2018-05-27 DIAGNOSIS — M79675 Pain in left toe(s): Secondary | ICD-10-CM

## 2018-05-27 DIAGNOSIS — M79674 Pain in right toe(s): Secondary | ICD-10-CM

## 2018-05-27 NOTE — Patient Instructions (Signed)
Onychomycosis/Fungal Toenails  WHAT IS IT? An infection that lies within the keratin of your nail plate that is caused by a fungus.  WHY ME? Fungal infections affect all ages, sexes, races, and creeds.  There may be many factors that predispose you to a fungal infection such as age, coexisting medical conditions such as diabetes, or an autoimmune disease; stress, medications, fatigue, genetics, etc.  Bottom line: fungus thrives in a warm, moist environment and your shoes offer such a location.  IS IT CONTAGIOUS? Theoretically, yes.  You do not want to share shoes, nail clippers or files with someone who has fungal toenails.  Walking around barefoot in the same room or sleeping in the same bed is unlikely to transfer the organism.  It is important to realize, however, that fungus can spread easily from one nail to the next on the same foot.  HOW DO WE TREAT THIS?  There are several ways to treat this condition.  Treatment may depend on many factors such as age, medications, pregnancy, liver and kidney conditions, etc.  It is best to ask your doctor which options are available to you.  1. No treatment.   Unlike many other medical concerns, you can live with this condition.  However for many people this can be a painful condition and may lead to ingrown toenails or a bacterial infection.  It is recommended that you keep the nails cut short to help reduce the amount of fungal nail. 2. Topical treatment.  These range from herbal remedies to prescription strength nail lacquers.  About 40-50% effective, topicals require twice daily application for approximately 9 to 12 months or until an entirely new nail has grown out.  The most effective topicals are medical grade medications available through physicians offices. 3. Oral antifungal medications.  With an 80-90% cure rate, the most common oral medication requires 3 to 4 months of therapy and stays in your system for a year as the new nail grows out.  Oral  antifungal medications do require blood work to make sure it is a safe drug for you.  A liver function panel will be performed prior to starting the medication and after the first month of treatment.  It is important to have the blood work performed to avoid any harmful side effects.  In general, this medication safe but blood work is required. 4. Laser Therapy.  This treatment is performed by applying a specialized laser to the affected nail plate.  This therapy is noninvasive, fast, and non-painful.  It is not covered by insurance and is therefore, out of pocket.  The results have been very good with a 80-95% cure rate.  [facility] is the only practice in the area to offer this therapy. 5. Permanent Nail Avulsion.  Removing the entire nail so that a new nail will not grow back.  Corns and Calluses Corns are small areas of thickened skin that occur on the top, sides, or tip of a toe. They contain a cone-shaped core with a point that can press on a nerve below. This causes pain.  Calluses are areas of thickened skin that can occur anywhere on the body, including the hands, fingers, palms, soles of the feet, and heels. Calluses are usually larger than corns. What are the causes? Corns and calluses are caused by rubbing (friction) or pressure, such as from shoes that are too tight or do not fit properly. What increases the risk? Corns are more likely to develop in people   who have misshapen toes (toe deformities), such as hammer toes. Calluses can occur with friction to any area of the skin. They are more likely to develop in people who:  Work with their hands.  Wear shoes that fit poorly, are too tight, or are high-heeled.  Have toe deformities. What are the signs or symptoms? Symptoms of a corn or callus include:  A hard growth on the skin.  Pain or tenderness under the skin.  Redness and swelling.  Increased discomfort while wearing tight-fitting shoes, if your feet are  affected. If a corn or callus becomes infected, symptoms may include:  Redness and swelling that gets worse.  Pain.  Fluid, blood, or pus draining from the corn or callus. How is this diagnosed? Corns and calluses may be diagnosed based on your symptoms, your medical history, and a physical exam. How is this treated? Treatment for corns and calluses may include:  Removing the cause of the friction or pressure. This may involve: ? Changing your shoes. ? Wearing shoe inserts (orthotics) or other protective layers in your shoes, such as a corn pad. ? Wearing gloves.  Applying medicine to the skin (topical medicine) to help soften skin in the hardened, thickened areas.  Removing layers of dead skin with a file to reduce the size of the corn or callus.  Removing the corn or callus with a scalpel or laser.  Taking antibiotic medicines, if your corn or callus is infected.  Having surgery, if a toe deformity is the cause. Follow these instructions at home:   Take over-the-counter and prescription medicines only as told by your health care provider.  If you were prescribed an antibiotic, take it as told by your health care provider. Do not stop taking it even if your condition starts to improve.  Wear shoes that fit well. Avoid wearing high-heeled shoes and shoes that are too tight or too loose.  Wear any padding, protective layers, gloves, or orthotics as told by your health care provider.  Soak your hands or feet and then use a file or pumice stone to soften your corn or callus. Do this as told by your health care provider.  Check your corn or callus every day for symptoms of infection. Contact a health care provider if you:  Notice that your symptoms do not improve with treatment.  Have redness or swelling that gets worse.  Notice that your corn or callus becomes painful.  Have fluid, blood, or pus coming from your corn or callus.  Have new symptoms. Summary  Corns are  small areas of thickened skin that occur on the top, sides, or tip of a toe.  Calluses are areas of thickened skin that can occur anywhere on the body, including the hands, fingers, palms, and soles of the feet. Calluses are usually larger than corns.  Corns and calluses are caused by rubbing (friction) or pressure, such as from shoes that are too tight or do not fit properly.  Treatment may include wearing any padding, protective layers, gloves, or orthotics as told by your health care provider. This information is not intended to replace advice given to you by your health care provider. Make sure you discuss any questions you have with your health care provider. Document Released: 01/21/2004 Document Revised: 02/27/2017 Document Reviewed: 02/27/2017 Elsevier Interactive Patient Education  2019 [employer]  Diabetes Mellitus and Foot Care Foot care is an important part of your health, especially when you have diabetes. Diabetes may cause you to have   problems because of poor blood flow (circulation) to your feet and legs, which can cause your skin to: Become thinner and drier. Break more easily. Heal more slowly. Peel and crack. You may also have nerve damage (neuropathy) in your legs and feet, causing decreased feeling in them. This means that you may not notice minor injuries to your feet that could lead to more serious problems. Noticing and addressing any potential problems early is the best way to prevent future foot problems. How to care for your feet Foot hygiene Wash your feet daily with warm water and mild soap. Do not use hot water. Then, pat your feet and the areas between your toes until they are completely dry. Do not soak your feet as this can dry your skin. Trim your toenails straight across. Do not dig under them or around the cuticle. File the edges of your nails with an emery board or nail file. Apply a moisturizing lotion or petroleum jelly to the skin on your feet and to  dry, brittle toenails. Use lotion that does not contain alcohol and is unscented. Do not apply lotion between your toes. Shoes and socks Wear clean socks or stockings every day. Make sure they are not too tight. Do not wear knee-high stockings since they may decrease blood flow to your legs. Wear shoes that fit properly and have enough cushioning. Always look in your shoes before you put them on to be sure there are no objects inside. To break in new shoes, wear them for just a few hours a day. This prevents injuries on your feet. Wounds, scrapes, corns, and calluses Check your feet daily for blisters, cuts, bruises, sores, and redness. If you cannot see the bottom of your feet, use a mirror or ask someone for help. Do not cut corns or calluses or try to remove them with medicine. If you find a minor scrape, cut, or break in the skin on your feet, keep it and the skin around it clean and dry. You may clean these areas with mild soap and water. Do not clean the area with peroxide, alcohol, or iodine. If you have a wound, scrape, corn, or callus on your foot, look at it several times a day to make sure it is healing and not infected. Check for: Redness, swelling, or pain. Fluid or blood. Warmth. Pus or a bad smell. General instructions Do not cross your legs. This may decrease blood flow to your feet. Do not use heating pads or hot water bottles on your feet. They may burn your skin. If you have lost feeling in your feet or legs, you may not know this is happening until it is too late. Protect your feet from hot and cold by wearing shoes, such as at the beach or on hot pavement. Schedule a complete foot exam at least once a year (annually) or more often if you have foot problems. If you have foot problems, report any cuts, sores, or bruises to your health care provider immediately. Contact a health care provider if: You have a medical condition that increases your risk of infection and you have any  cuts, sores, or bruises on your feet. You have an injury that is not healing. You have redness on your legs or feet. You feel burning or tingling in your legs or feet. You have pain or cramps in your legs and feet. Your legs or feet are numb. Your feet always feel cold. You have pain around a toenail. Get help   right away if: You have a wound, scrape, corn, or callus on your foot and: You have pain, swelling, or redness that gets worse. You have fluid or blood coming from the wound, scrape, corn, or callus. Your wound, scrape, corn, or callus feels warm to the touch. You have pus or a bad smell coming from the wound, scrape, corn, or callus. You have a fever. You have a red line going up your leg. Summary Check your feet every day for cuts, sores, red spots, swelling, and blisters. Moisturize feet and legs daily. Wear shoes that fit properly and have enough cushioning. If you have foot problems, report any cuts, sores, or bruises to your health care provider immediately. Schedule a complete foot exam at least once a year (annually) or more often if you have foot problems. This information is not intended to replace advice given to you by your health care provider. Make sure you discuss any questions you have with your health care provider. Document Released: 04/13/2000 Document Revised: 05/29/2017 Document Reviewed: 05/18/2016 Elsevier Interactive Patient Education  2019 [employer]  

## 2018-05-29 ENCOUNTER — Other Ambulatory Visit: Payer: Self-pay | Admitting: Internal Medicine

## 2018-06-06 NOTE — Progress Notes (Signed)
Subjective: Jeremiah Conway presents today with painful, thick toenails 1-5 b/l that he cannot cut and which interfere with daily activities.  Pain is aggravated when wearing enclosed shoe gear.  Ali Lowe, MD is his PCP.  His last visit was December 12, 2017.   Current Outpatient Medications:  .  albuterol (PROVENTIL HFA;VENTOLIN HFA) 108 (90 Base) MCG/ACT inhaler, Inhale 1-2 puffs into the lungs every 6 (six) hours as needed for wheezing or shortness of breath., Disp: 1 Inhaler, Rfl: 3 .  aspirin 81 MG tablet, Take 1 tablet (81 mg total) by mouth daily., Disp: 30 tablet, Rfl: 11 .  atorvastatin (LIPITOR) 80 MG tablet, Take 80 mg by mouth daily., Disp: , Rfl:  .  Elastic Bandages & Supports (V-5 HIGH COMPRESSION HOSE) MISC, 1pair, Wear them while driving, walking, standing, Disp: 1 each, Rfl: 1 .  isosorbide-hydrALAZINE (BIDIL) 20-37.5 MG tablet, Take 1 tablet by mouth 2 (two) times daily., Disp: 60 tablet, Rfl: 6 .  metFORMIN (GLUCOPHAGE) 1000 MG tablet, Take 1 tablet (1,000 mg total) by mouth 2 (two) times daily with a meal. TAKE 1 TABLET BY MOUTH TWICE DAILY WITH A MEAL, Disp: , Rfl:  .  metoprolol (TOPROL XL) 200 MG 24 hr tablet, Take 1 tablet (200 mg total) by mouth daily., Disp: 90 tablet, Rfl: 3 .  montelukast (SINGULAIR) 10 MG tablet, Take 1 tablet (10 mg total) by mouth at bedtime., Disp: 90 tablet, Rfl: 3 .  oxymetazoline (AFRIN) 0.05 % nasal spray, Place 1 spray into both nostrils 2 (two) times daily as needed for congestion., Disp: , Rfl:  .  spironolactone (ALDACTONE) 25 MG tablet, Take 1 tablet (25 mg total) by mouth daily., Disp: 90 tablet, Rfl: 3 .  torsemide (DEMADEX) 20 MG tablet, Take 2 x 20 mg tablets (40 mg total) in the morning and take 1 x 20 mg tablet in the evening., Disp: 180 tablet, Rfl: 3 .  tamsulosin (FLOMAX) 0.4 MG CAPS capsule, TAKE 1 CAPSULE BY MOUTH DAILY AFTER BREAKFAST, Disp: 90 capsule, Rfl: 0  Allergies  Allergen Reactions  . Ace Inhibitors Swelling   REACTION: Angioedema, tongue swelling to benazepril  . Angiotensin Receptor Blockers Swelling    Objective:  Vascular Examination: Capillary refill time less than 3 seconds x 10 digits  Dorsalis pedis and Posterior tibial pulses palpable b/l  Digital hair present x 10 digits  Skin temperature gradient WNL b/l  Bilateral lower extremity edema noted.  Dermatological Examination: Skin with normal turgor, texture and tone b/l  Toenails 1-5 b/l discolored, thick, dystrophic with subungual debris and pain with palpation to nailbeds due to thickness of nails.  Musculoskeletal: Muscle strength 5/5 to all LE muscle groups  Pes planus foot deformity bilaterally  Hallux abductovalgus with bunion deformity bilaterally  No pain, crepitus or joint limitation noted with ROM.   Neurological: Sensation intact with 10 gram monofilament.  Vibratory sensation intact.  Last A1c 6.1%, July 2019.  Assessment: Painful onychomycosis toenails 1-5 b/l   Plan: 1. ABN signed for 2020. 2. Toenails 1-5 b/l were debrided in length and girth without iatrogenic bleeding. 3. Patient to continue soft, supportive shoe gear 4. Patient to report any pedal injuries to medical professional immediately. 5. Follow up 3 months.  6. Patient/POA to call should there be a concern in the interim.

## 2018-06-09 ENCOUNTER — Other Ambulatory Visit: Payer: Self-pay | Admitting: Cardiology

## 2018-07-26 ENCOUNTER — Other Ambulatory Visit: Payer: Self-pay | Admitting: Internal Medicine

## 2018-07-30 ENCOUNTER — Other Ambulatory Visit: Payer: Self-pay | Admitting: Internal Medicine

## 2018-07-30 DIAGNOSIS — R0902 Hypoxemia: Secondary | ICD-10-CM

## 2018-07-30 DIAGNOSIS — Z9981 Dependence on supplemental oxygen: Principal | ICD-10-CM

## 2018-07-30 DIAGNOSIS — J301 Allergic rhinitis due to pollen: Secondary | ICD-10-CM

## 2018-08-21 ENCOUNTER — Ambulatory Visit (INDEPENDENT_AMBULATORY_CARE_PROVIDER_SITE_OTHER): Payer: Medicaid Other | Admitting: Internal Medicine

## 2018-08-21 ENCOUNTER — Other Ambulatory Visit: Payer: Self-pay

## 2018-08-21 DIAGNOSIS — I5020 Unspecified systolic (congestive) heart failure: Secondary | ICD-10-CM | POA: Diagnosis not present

## 2018-08-21 DIAGNOSIS — N183 Chronic kidney disease, stage 3 unspecified: Secondary | ICD-10-CM

## 2018-08-21 DIAGNOSIS — E1122 Type 2 diabetes mellitus with diabetic chronic kidney disease: Secondary | ICD-10-CM

## 2018-08-21 NOTE — Assessment & Plan Note (Signed)
Well controlled on metformin. Does not check sugars at home. Denies polydipsia, polyuria, or symptoms of hypoglycemia.

## 2018-08-21 NOTE — Assessment & Plan Note (Signed)
Follows with cardiology. Compliant with all medications. Wears 2.5L [place] all the time. Denies worsening sob, no chest pain or LEE. He has not been weighing himself daily and I encouraged him to resume this. He has a cardiology appt next month. Will need BMP at next visit (either with [place] or cards)

## 2018-08-21 NOTE — Progress Notes (Signed)
  [facility] Internal Medicine Residency Telephone Encounter Continuity Care Appointment  HPI:   This telephone encounter was created for Mr. Jeremiah Conway on 08/21/2018 for the following purpose/cc CHF, OSA.   Past Medical History:  Past Medical History:  Diagnosis Date  . Allergic rhinitis   . Angioedema   . Bunion   . Diabetes mellitus   . History of hematuria 11/20/2007  . Hyperlipidemia   . Hypertension   . Hypokalemia   . Obesity   . OSA on CPAP   . Pericarditis 1988  . RBBB (right bundle branch block with left anterior fascicular block)   . Renal insufficiency    Cr 1.2 baseline  . Tobacco abuse    Stopped in 2002. Now smokes 1.5ppd  . Unspecified venous (peripheral) insufficiency       ROS:   Denies sob, chest pain, cough, fever   Assessment / Plan / Recommendations:   Please see A&P under problem oriented charting for assessment of the patient's acute and chronic medical conditions.   As always, pt is advised that if symptoms worsen or new symptoms arise, they should go to an urgent care facility or to to ER for further evaluation.   Consent and Medical Decision Making:   Patient discussed with Dr. Heide Spark  This is a telephone encounter between Jeremiah Conway and Ali Lowe on 08/21/2018 for CHF, OSA. The visit was conducted with the patient located at home and Ali Lowe at [facility]. The patient's identity was confirmed using their DOB and current address. The patient has consented to being evaluated through a telephone encounter and understands the associated risks (an examination cannot be done and the patient may need to come in for an appointment) / benefits (allows the patient to remain at home, decreasing exposure to coronavirus). I personally spent 12 minutes on medical discussion.

## 2018-08-22 NOTE — Progress Notes (Signed)
Internal Medicine Clinic Attending  Case discussed with Dr. Vogel  at the time of the visit.  We reviewed the resident's history and exam and pertinent patient test results.  I agree with the assessment, diagnosis, and plan of care documented in the resident's note.  

## 2018-08-26 ENCOUNTER — Ambulatory Visit: Payer: Medicaid Other | Admitting: Podiatry

## 2018-08-26 ENCOUNTER — Other Ambulatory Visit: Payer: Self-pay | Admitting: Internal Medicine

## 2018-09-10 ENCOUNTER — Telehealth: Payer: Self-pay | Admitting: Cardiology

## 2018-09-10 NOTE — Telephone Encounter (Signed)
New message      Called to convert 09-24-18 office visit to video visit.  Patient gave consent for virtual video visit.  Patient does not have a blood pressure machine at home.  YOUR CARDIOLOGY TEAM HAS ARRANGED FOR AN E-VISIT FOR YOUR APPOINTMENT - PLEASE REVIEW IMPORTANT INFORMATION BELOW SEVERAL DAYS PRIOR TO YOUR APPOINTMENT  Due to the recent COVID-19 pandemic, we are transitioning in-person office visits to tele-medicine visits in an effort to decrease unnecessary exposure to our patients, their families, and staff. These visits are billed to your insurance just like a normal visit is. We also encourage you to sign up for MyChart if you have not already done so. You will need a smartphone if possible. For patients that do not have this, we can still complete the visit using a regular telephone but do prefer a smartphone to enable video when possible. You may have a family member that lives with you that can help. If possible, we also ask that you have a blood pressure cuff and scale at home to measure your blood pressure, heart rate and weight prior to your scheduled appointment. Patients with clinical needs that need an in-person evaluation and testing will still be able to come to the office if absolutely necessary. If you have any questions, feel free to call our office.     YOUR PROVIDER WILL BE USING THE FOLLOWING PLATFORM TO COMPLETE YOUR VISIT: Doximity   IF USING MYCHART - How to Download the MyChart App to Your SmartPhone   - If Apple, go to [employer] and type in MyChart in the search bar and download the app. If Android, ask patient to go to [employer] and type in [place] in the search bar and download the app. The app is free but as with any other app downloads, your phone may require you to verify saved payment information or Apple/Android password.  - You will need to then log into the app with your MyChart username and password, and select [facility] as your healthcare  provider to link the account.  - When it is time for your visit, go to the MyChart app, find appointments, and click Begin Video Visit. Be sure to Select Allow for your device to access the Microphone and Camera for your visit. You will then be connected, and your provider will be with you shortly.  **If you have any issues connecting or need assistance, please contact MyChart service desk [phone])**  **If using a computer, in order to ensure the best quality for your visit, you will need to use either of the following Internet Browsers: Agricultural consultant or Microsoft Edge**   IF USING DOXIMITY or [email or web address].ME - The staff will give you instructions on receiving your link to join the meeting the day of your visit.      2-3 DAYS BEFORE YOUR APPOINTMENT  You will receive a telephone call from one of our HeartCare team members - your caller ID may say "Unknown caller." If this is a video visit, we will walk you through how to get the video launched on your phone. We will remind you check your blood pressure, heart rate and weight prior to your scheduled appointment. If you have an Apple Watch or Kardia, please upload any pertinent ECG strips the day before or morning of your appointment to MyChart. Our staff will also make sure you have reviewed the consent and agree to move forward with your scheduled tele-health visit.  THE DAY OF YOUR APPOINTMENT  Approximately 15 minutes prior to your scheduled appointment, you will receive a telephone call from one of [place] team - your caller ID may say "Unknown caller."  Our staff will confirm medications, vital signs for the day and any symptoms you may be experiencing. Please have this information available prior to the time of visit start. It may also be helpful for you to have a pad of paper and pen handy for any instructions given during your visit. They will also walk you through joining the smartphone meeting if this is a video  visit.    CONSENT FOR TELE-HEALTH VISIT - PLEASE REVIEW  I hereby voluntarily request, consent and authorize [facility] and its employed or contracted physicians, physician assistants, nurse practitioners or other licensed health care professionals (the Practitioner), to provide me with telemedicine health care services (the Services") as deemed necessary by the treating Practitioner. I acknowledge and consent to receive the Services by the Practitioner via telemedicine. I understand that the telemedicine visit will involve communicating with the Practitioner through live audiovisual communication technology and the disclosure of certain medical information by electronic transmission. I acknowledge that I have been given the opportunity to request an in-person assessment or other available alternative prior to the telemedicine visit and am voluntarily participating in the telemedicine visit.  I understand that I have the right to withhold or withdraw my consent to the use of telemedicine in the course of my care at any time, without affecting my right to future care or treatment, and that the Practitioner or I may terminate the telemedicine visit at any time. I understand that I have the right to inspect all information obtained and/or recorded in the course of the telemedicine visit and may receive copies of available information for a reasonable fee.  I understand that some of the potential risks of receiving the Services via telemedicine include:   Delay or interruption in medical evaluation due to technological equipment failure or disruption;  Information transmitted may not be sufficient (e.g. poor resolution of images) to allow for appropriate medical decision making by the Practitioner; and/or   In rare instances, security protocols could fail, causing a breach of personal health information.  Furthermore, I acknowledge that it is my responsibility to provide information about my medical  history, conditions and care that is complete and accurate to the best of my ability. I acknowledge that Practitioner's advice, recommendations, and/or decision may be based on factors not within their control, such as incomplete or inaccurate data provided by me or distortions of diagnostic images or specimens that may result from electronic transmissions. I understand that the practice of medicine is not an exact science and that Practitioner makes no warranties or guarantees regarding treatment outcomes. I acknowledge that I will receive a copy of this consent concurrently upon execution via email to the email address I last provided but may also request a printed copy by calling the office of [place].    I understand that my insurance will be billed for this visit.   I have read or had this consent read to me.  I understand the contents of this consent, which adequately explains the benefits and risks of the Services being provided via telemedicine.   I have been provided ample opportunity to ask questions regarding this consent and the Services and have had my questions answered to my satisfaction.  I give my informed consent for the services to be provided through the  use of telemedicine in my medical care  By participating in this telemedicine visit I agree to the above.

## 2018-09-23 NOTE — Progress Notes (Signed)
Virtual Visit via Video Note   This visit type was conducted due to national recommendations for restrictions regarding the COVID-19 Pandemic (e.g. social distancing) in an effort to limit this patient's exposure and mitigate transmission in our community.  Due to his co-morbid illnesses, this patient is at least at moderate risk for complications without adequate follow up.  This format is felt to be most appropriate for this patient at this time.  All issues noted in this document were discussed and addressed.  A limited physical exam was performed with this format.  Please refer to the patient's chart for his consent to telehealth for [facility].   Date:  09/24/2018   ID:  Jeremiah Conway, DOB [birth year], MRN [id number]  Patient Location: Home Provider Location: Office  PCP:  Ali Lowe, MD  Cardiologist:  Donato Schultz, MD  Electrophysiologist:  None   Evaluation Performed:  Follow-Up Visit  Chief Complaint:  systolic heart failure  History of Present Illness:    Jeremiah Conway is a 63 y.o. male with history of type 2 diabetes, hypertension, ejection fraction of 30 to 35%, obstructive sleep apnea, morbid obesity, chronic kidney disease stage III creatinine ranging aound 2.  Diagnosed in May 2019 with systolic heart failure.  Presumed etiology from possible prior inferior wall MI indicated on ECG, obstructive sleep apnea, morbid obesity combination.  Echo also showed dilation of right atrium.  Has begun goal-directed therapy, spironolactone, metoprolol.  Also utilizes nasal cannula oxygen.  Weight has been an issue.  Has increased as an outpatient despite diuretics.  Cannot be on ACE inhibitor because of angioedema, no Entresto because of angioedema.  On metoprolol.  He was switched from Lasix 80 mg a day to torsemide 20 mg a day given his increase in weight gain.  BNP is 1800, increased.  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  SOB, new meds, may be helping.  Before 20 ft. Out of breath. Does feel some chest fullness when pushing. No syncope. 2 months ago was sleeping sitting up. Now on 2pillow. Using O2. Supposed to take OSA test soon. Tries to stay away from salt. Drinks a lot of coffee.   01/16/18 - CC: here for CHF follow up. Cath was no CAD, well compensated.  Nonischemic cardiomyopathy wedge pressure 24.  Cardiac output 7 L.  Overall he is been feeling well.  He still continues to use home oxygen.  He follows the [employer].  No fevers chills nausea vomiting syncope bleeding.  Last creatinine 1.9 slightly elevated but he is on good diuretic regimen.  05/20/2018-chief complaint here for heart failure follow-up.  We once again reiterated his fluid intake to 1.5 L a day.  His Demadex was decreased to 40/20.  Renal function shows kidneys with a creatinine of about 2.  Wearing home oxygen.  Catheterization again no CAD.  Overall he has been feeling quite well.  Stable.  No significant change in shortness of breath.  No fevers chills nausea vomiting syncope.  BMP was done today at [facility] by Dr. Keitha Butte.   Today no chest pain or SOB.  He denies any swelling and wears support stockings ever yother day now that he is at home more.  He does wear 02 at 2.5 L all the time.  He no longer wears his CPAP at night since he is wearing oxygen.  He had stopped weighing but will resume.  He would like to lose wt but when he tries on  his own his glucose drops.  We discussed [facility] in [place] and [facility] - both numbers given.  Wears mask when he goes out. Has his groceries delivered.  Is very careful not to be exposed.   He does note today when walking a good distance he hs racing HR that resolves with sitting.  No associated symptoms.     The patient does not have symptoms concerning for COVID-19 infection (fever, chills, cough, or new shortness of breath).    Past Medical History:  Diagnosis Date  . Allergic rhinitis   .  Angioedema   . Bunion   . Diabetes mellitus   . History of hematuria 11/20/2007  . Hyperlipidemia   . Hypertension   . Hypokalemia   . Obesity   . OSA on CPAP   . Pericarditis 1988  . RBBB (right bundle branch block with left anterior fascicular block)   . Renal insufficiency    Cr 1.2 baseline  . Tobacco abuse    Stopped in 2002. Now smokes 1.5ppd  . Unspecified venous (peripheral) insufficiency    Past Surgical History:  Procedure Laterality Date  . fluid retention  1988   removed from abd  . MOUTH SURGERY N/A 07.08.2014  . RIGHT/LEFT HEART CATH AND CORONARY ANGIOGRAPHY N/A 12/16/2017   Procedure: RIGHT/LEFT HEART CATH AND CORONARY ANGIOGRAPHY;  Surgeon: Corky CraftsVaranasi, Jayadeep S, MD;  [facility];  Service: Cardiovascular;  Laterality: N/A;     Current Meds  Medication Sig  . albuterol (PROVENTIL HFA;VENTOLIN HFA) 108 (90 Base) MCG/ACT inhaler Inhale 1-2 puffs into the lungs every 6 (six) hours as needed for wheezing or shortness of breath.  Marland Kitchen. aspirin 81 MG tablet Take 1 tablet (81 mg total) by mouth daily.  Marland Kitchen. atorvastatin (LIPITOR) 80 MG tablet TAKE 1 TABLET BY MOUTH DAILY  . BIDIL 20-37.5 MG tablet TAKE 1 TABLET BY MOUTH TWICE DAILY  . Elastic Bandages & Supports (V-5 HIGH COMPRESSION HOSE) MISC 1pair, Wear them while driving, walking, standing  . metFORMIN (GLUCOPHAGE) 1000 MG tablet Take 1 tablet (1,000 mg total) by mouth 2 (two) times daily with a meal. TAKE 1 TABLET BY MOUTH TWICE DAILY WITH A MEAL  . metoprolol (TOPROL XL) 200 MG 24 hr tablet Take 1 tablet (200 mg total) by mouth daily.  . montelukast (SINGULAIR) 10 MG tablet TAKE 1 TABLET(10 MG) BY MOUTH AT BEDTIME  . Multiple Vitamin (ONE-A-DAY 55 PLUS PO) Take 1 tablet by mouth daily.  Marland Kitchen. oxymetazoline (AFRIN) 0.05 % nasal spray Place 1 spray into both nostrils 2 (two) times daily as needed for congestion.  Marland Kitchen. spironolactone (ALDACTONE) 25 MG tablet Take 1 tablet (25 mg total) by mouth daily.  . tamsulosin  (FLOMAX) 0.4 MG CAPS capsule TAKE 1 CAPSULE BY MOUTH DAILY AFTER BREAKFAST  . torsemide (DEMADEX) 20 MG tablet Take 2 x 20 mg tablets (40 mg total) in the morning and take 1 x 20 mg tablet in the evening.     Allergies:   Ace inhibitors and Angiotensin receptor blockers   Social History   Tobacco Use  . Smoking status: Former Smoker    Packs/day: 0.50    Types: Cigarettes    Last attempt to quit: 06/10/2012    Years since quitting: 6.2  . Smokeless tobacco: Never Used  . Tobacco comment: given quit line info  Substance Use Topics  . Alcohol use: Yes    Alcohol/week: 0.0 standard drinks  Comment: On holidays.  . Drug use: No     Family Hx: The patient's family history includes Colon cancer in his maternal grandfather; Colon polyps in his maternal grandfather; Diabetes in his father; Heart disease in his father; Hypertension in his father and mother; Ovarian cancer in his maternal aunt.  ROS:   Please see the history of present illness.    General:no colds or fevers, no weight changes Skin:no rashes or ulcers HEENT:no blurred vision, no congestion CV:see HPI PUL:see HPI GI:no diarrhea constipation or melena, no indigestion GU:no hematuria, no dysuria MS:no joint pain, no claudication Neuro:no syncope, no lightheadedness Endo:+ diabetes, no thyroid disease  All other systems reviewed and are negative.   Prior CV studies:   The following studies were reviewed today:  ECHO 09/30/17:  - Left ventricle: Diffuse hypokinesis worse in the inferior wall. The cavity size was mildly dilated. Wall thickness was increased in a pattern of moderate LVH. Systolic function was moderately to severely reduced. The estimated ejection fraction was in the range of 30% to 35%. Left ventricular diastolic function parameters were normal. - Aortic valve: There was mild regurgitation. - Mitral valve: There was mild regurgitation. - Left atrium: The atrium was moderately dilated.  - Right ventricle: The cavity size was mildly dilated. - Right atrium: The atrium was mildly dilated. - Atrial septum: No defect or patent foramen ovale was identified. - Pulmonary arteries: PA peak pressure: 49 mm Hg (S).  Cath 12/16/17:  Mid LAD lesion is 10% stenosed.  LV end diastolic pressure is mildly elevated.  There is no aortic valve stenosis.  Hemodynamic findings consistent with moderate pulmonary hypertension.  No signficant CAD.  Continue medical therapy for nonischemic cardiomyopathy.  CO 7.7 L/min; CI 3.01; mean PA pressure 42 mm Hg; mean PCWP 24 mm Hg. Ao sat on RA 84%; PA sat 58%.    Continue medical therapy for nonischemic cardiomyopathy.   Labs/Other Tests and Data Reviewed:    EKG:  An ECG dated 12/14/17 was personally reviewed today and demonstrated:  SR with PACs and RBBB no acute changes  Recent Labs: 11/27/2017: B Natriuretic Peptide 1,824.1 12/12/2017: Hemoglobin 12.5; Platelets 244 03/26/2018: BUN 23; Creatinine, Ser 2.00; Potassium 3.7; Sodium 142   Recent Lipid Panel Lab Results  Component Value Date/Time   CHOL 172 09/16/2013 02:39 PM   TRIG 162 (H) 09/16/2013 02:39 PM   HDL 34 (L) 09/16/2013 02:39 PM   CHOLHDL 5.1 09/16/2013 02:39 PM   LDLCALC 106 (H) 09/16/2013 02:39 PM    Wt Readings from Last 3 Encounters:  09/24/18 (!) 324 lb (147 kg)  05/20/18 (!) 324 lb 3.2 oz (147.1 kg)  03/17/18 (!) 326 lb 1.9 oz (147.9 kg)     Objective:    Vital Signs:  Ht 5\' 10"  (1.778 m)   Wt (!) 324 lb (147 kg)   BMI 46.49 kg/m    VITAL SIGNS:  reviewed  General: male with oxygen in place.  NAD Neuro A&O X 3 follows commands Lungs: able to talk in complete sentences without SOB Neck: No JVD Psych: pleasant affect  ASSESSMENT & PLAN:    1. Chronic systolic HF EF 30-35% euvolemic today, discussed weighing daily.  Doing well on demadex.  No changes.  He had labs done today through Dr. Marisue Humble.  2. Morbid obesity discussed [facility]  and [facility] and numbers for both given to pt.   3. OSA and hypoventilation syndrome wears 02 24/7 but no longer  uses CPAP- asked him to call PCP most likely needs both. 4. DM per PCP 5. RBBB and inf. Infarct pattern, no chest pain stable   COVID-19 Education: The signs and symptoms of COVID-19 were discussed with the patient and how to seek care for testing (follow up with PCP or arrange E-visit).  The importance of social distancing was discussed today.  Time:   Today, I have spent 15 minutes with the patient with telehealth technology discussing the above problems.     Medication Adjustments/Labs and Tests Ordered: Current medicines are reviewed at length with the patient today.  Concerns regarding medicines are outlined above.   Tests Ordered: No orders of the defined types were placed in this encounter.   Medication Changes: No orders of the defined types were placed in this encounter.   Disposition:  Follow up in 3 month(s)  Signed, Nada Boozer, NP  09/24/2018 10:28 AM    [facility]

## 2018-09-24 ENCOUNTER — Other Ambulatory Visit: Payer: Self-pay

## 2018-09-24 ENCOUNTER — Encounter: Payer: Self-pay | Admitting: Cardiology

## 2018-09-24 ENCOUNTER — Telehealth (INDEPENDENT_AMBULATORY_CARE_PROVIDER_SITE_OTHER): Payer: Medicaid Other | Admitting: Cardiology

## 2018-09-24 VITALS — Ht 70.0 in | Wt 324.0 lb

## 2018-09-24 DIAGNOSIS — Z79899 Other long term (current) drug therapy: Secondary | ICD-10-CM

## 2018-09-24 DIAGNOSIS — R0902 Hypoxemia: Secondary | ICD-10-CM

## 2018-09-24 DIAGNOSIS — I5020 Unspecified systolic (congestive) heart failure: Secondary | ICD-10-CM

## 2018-09-24 NOTE — Patient Instructions (Signed)
Medication Instructions:  Your physician recommends that you continue on your current medications as directed. Please refer to the Current Medication list given to you today.  If you need a refill on your cardiac medications before your next appointment, please call your pharmacy.   Lab work: None ordered  If you have labs (blood work) drawn today and your tests are completely normal, you will receive your results only by: Marland Kitchen MyChart Message (if you have MyChart) OR . A paper copy in the mail If you have any lab test that is abnormal or we need to change your treatment, we will call you to review the results.  Testing/Procedures: None ordered  Follow-Up: At [facility], you and your health needs are our priority.  As part of our continuing mission to provide you with exceptional heart care, we have created designated Provider Care Teams.  These Care Teams include your primary Cardiologist (physician) and Advanced Practice Providers (APPs -  Physician Assistants and Nurse Practitioners) who all work together to provide you with the care you need, when you need it. You will need a follow up appointment in 3 months.  Please call our office 2 months in advance to schedule this appointment.  You may see Donato Schultz, MD or one of the following Advanced Practice Providers on your designated Care Team:   Norma Fredrickson, NP Nada Boozer, NP . Georgie Chard, NP  Any Other Special Instructions Will Be Listed Below (If Applicable). [facility] wt management [phone]  [facility] [address] [phone]

## 2018-10-07 ENCOUNTER — Other Ambulatory Visit: Payer: Self-pay | Admitting: Internal Medicine

## 2018-10-07 DIAGNOSIS — Z9981 Dependence on supplemental oxygen: Secondary | ICD-10-CM

## 2018-10-07 DIAGNOSIS — R0902 Hypoxemia: Secondary | ICD-10-CM

## 2018-10-07 DIAGNOSIS — J301 Allergic rhinitis due to pollen: Secondary | ICD-10-CM

## 2018-10-08 ENCOUNTER — Telehealth: Payer: Self-pay | Admitting: Cardiology

## 2018-10-08 NOTE — Telephone Encounter (Signed)
Needs refill on PROVENTIL HFA 108 (90 Base) MCG/ACT inhaler  pt contact  [place] [place] [facility] [place]

## 2018-10-08 NOTE — Telephone Encounter (Signed)
Called pt and left message informing pt that he needed to contact his PCP Dr. Donne Hazel for a refill on his inhaler and if he has any other problems, questions or concerns to give our office a call back.

## 2018-10-18 ENCOUNTER — Encounter: Payer: Self-pay | Admitting: *Deleted

## 2018-10-24 ENCOUNTER — Ambulatory Visit: Payer: Medicaid Other | Admitting: Podiatry

## 2018-10-27 ENCOUNTER — Telehealth: Payer: Self-pay

## 2018-10-27 NOTE — Telephone Encounter (Signed)
Reviewed chart, last telehealth with dr Donne Hazel seems to be in march, he had telehealth with [facility] more recently but I do not see in either where he was instructed to discontinue a diuretic.

## 2018-10-27 NOTE — Telephone Encounter (Signed)
rtc to pt, he states he had a telehealth appt recently w/ dr Donne Hazel and she changed some of his medicines and he wants to go back on one of his meds- diuretic- in talking it is noted he spoke l. [facility] np cardiology. He was offered [facility] appt and refused 3 times, he will call cardiology and speak w/ them

## 2018-10-27 NOTE — Telephone Encounter (Signed)
Pt states ankle is swollen, want water pill to be filled. Pt do not know the name of the medicine, please call pt back.

## 2018-11-05 ENCOUNTER — Telehealth: Payer: Self-pay

## 2018-11-05 NOTE — Telephone Encounter (Signed)
Needs to speak with a nurse about Lasix. Please call pt back

## 2018-11-05 NOTE — Telephone Encounter (Signed)
Pt calls and states that since changing his diuretics, dr Donne Hazel changed these recently, he states he has gained about 3 pounds, has pitting edema in his lower legs and feet.gets more winded now after walking a short distance before he could walk a lot better. He would prefer not to come to clinic if he can avoid it, please advise, his # [phone] Please advise

## 2018-11-05 NOTE — Telephone Encounter (Signed)
telehealth appt 7/10 at 1015

## 2018-11-05 NOTE — Telephone Encounter (Signed)
I don't see where the torsemide was actually changed. Can we schedule him for an [place] when next available?

## 2018-11-07 ENCOUNTER — Other Ambulatory Visit: Payer: Self-pay

## 2018-11-07 ENCOUNTER — Encounter: Payer: Self-pay | Admitting: Internal Medicine

## 2018-11-07 ENCOUNTER — Ambulatory Visit (INDEPENDENT_AMBULATORY_CARE_PROVIDER_SITE_OTHER): Payer: Medicaid Other | Admitting: Internal Medicine

## 2018-11-07 DIAGNOSIS — I5020 Unspecified systolic (congestive) heart failure: Secondary | ICD-10-CM

## 2018-11-07 NOTE — Assessment & Plan Note (Addendum)
(  Televisit) Jeremiah Conway reports ankles swelling and dyspnea on exersion since 3 weeks ago after he stopped PM dose of Torsemide. He says that he was told not to take Torsemide in the evening and has jsut taken 40 mg in the AM.  He mentions that his weight increased to 341.2 lb yesterday from 339 2 days ago. (Does not know his prior weight. But per chart, his weight was 324 lb 6 weeks ago.) He denies any orthopnea and has no shortness of breath at rest or with minor activity. After chart review, I did not find a prior instruction about decreasing Torsemide to 40 mg at Am only. I ask the patient to resume prior dose of Torsemide and f/u in clinic next weak.  -Take Torsemide 40 mg at AM and 20 mg at PM -F/u in clinic in a week or sooner if no improvement for volume status evaluation and BMP. -Informed patient about seeking medical attension in ED if developed worsening of symptoms or sever shortness of breath

## 2018-11-07 NOTE — Addendum Note (Signed)
Addended by: Lalla Brothers T on: 11/07/2018 03:20 PM   Modules accepted: Level of Service

## 2018-11-07 NOTE — Progress Notes (Signed)
Internal Medicine Clinic Attending  Case discussed with Dr. Masoudi  at the time of the visit.  We reviewed the resident's history and exam and pertinent patient test results.  I agree with the assessment, diagnosis, and plan of care documented in the resident's note.  

## 2018-11-07 NOTE — Progress Notes (Signed)
  [facility] Internal Medicine Residency Telephone Encounter Continuity Care Appointment  HPI:   This telephone encounter was created for Mr. Jeremiah Conway on 11/07/2018 for the following purpose/cc ankle swelling.   Past Medical History:  Past Medical History:  Diagnosis Date  . Allergic rhinitis   . Angioedema   . Bunion   . Diabetes mellitus   . History of hematuria 11/20/2007  . Hyperlipidemia   . Hypertension   . Hypokalemia   . Obesity   . OSA on CPAP   . Pericarditis 1988  . RBBB (right bundle branch block with left anterior fascicular block)   . Renal insufficiency    Cr 1.2 baseline  . Tobacco abuse    Stopped in 2002. Now smokes 1.5ppd  . Unspecified venous (peripheral) insufficiency       ROS:      Assessment / Plan / Recommendations:   Please see A&P under problem oriented charting for assessment of the patient's acute and chronic medical conditions.   As always, pt is advised that if symptoms worsen or new symptoms arise, they should go to an urgent care facility or to to ER for further evaluation.   Consent and Medical Decision Making:   Patient  discussed with Dr. Evette Doffing  This is a telephone encounter between Jeremiah Conway and [place] on 11/07/2018 for ankle and dyspnea on exersion. The visit was conducted with the patient located at home and [facility] at [facility]. The patient's identity was confirmed using their DOB and current address. The patient has consented to being evaluated through a telephone encounter and understands the associated risks (an examination cannot be done and the patient may need to come in for an appointment) / benefits (allows the patient to remain at home, decreasing exposure to coronavirus). I personally spent 14 minutes on medical discussion.

## 2018-11-20 ENCOUNTER — Other Ambulatory Visit: Payer: Self-pay | Admitting: Cardiology

## 2018-11-20 ENCOUNTER — Other Ambulatory Visit: Payer: Self-pay | Admitting: Internal Medicine

## 2018-11-24 ENCOUNTER — Ambulatory Visit: Payer: Medicaid Other | Admitting: Podiatry

## 2018-11-24 ENCOUNTER — Encounter: Payer: Self-pay | Admitting: Podiatry

## 2018-11-24 ENCOUNTER — Other Ambulatory Visit: Payer: Self-pay

## 2018-11-24 DIAGNOSIS — N183 Chronic kidney disease, stage 3 unspecified: Secondary | ICD-10-CM

## 2018-11-24 DIAGNOSIS — M79675 Pain in left toe(s): Secondary | ICD-10-CM | POA: Diagnosis not present

## 2018-11-24 DIAGNOSIS — E0869 Diabetes mellitus due to underlying condition with other specified complication: Secondary | ICD-10-CM

## 2018-11-24 DIAGNOSIS — B351 Tinea unguium: Secondary | ICD-10-CM

## 2018-11-24 DIAGNOSIS — E0822 Diabetes mellitus due to underlying condition with diabetic chronic kidney disease: Secondary | ICD-10-CM

## 2018-11-24 DIAGNOSIS — E119 Type 2 diabetes mellitus without complications: Secondary | ICD-10-CM

## 2018-11-24 DIAGNOSIS — M79674 Pain in right toe(s): Secondary | ICD-10-CM

## 2018-11-24 NOTE — Patient Instructions (Signed)
Diabetes Mellitus and Foot Care Foot care is an important part of your health, especially when you have diabetes. Diabetes may cause you to have problems because of poor blood flow (circulation) to your feet and legs, which can cause your skin to:  Become thinner and drier.  Break more easily.  Heal more slowly.  Peel and crack. You may also have nerve damage (neuropathy) in your legs and feet, causing decreased feeling in them. This means that you may not notice minor injuries to your feet that could lead to more serious problems. Noticing and addressing any potential problems early is the best way to prevent future foot problems. How to care for your feet Foot hygiene  Wash your feet daily with warm water and mild soap. Do not use hot water. Then, pat your feet and the areas between your toes until they are completely dry. Do not soak your feet as this can dry your skin.  Trim your toenails straight across. Do not dig under them or around the cuticle. File the edges of your nails with an emery board or nail file.  Apply a moisturizing lotion or petroleum jelly to the skin on your feet and to dry, brittle toenails. Use lotion that does not contain alcohol and is unscented. Do not apply lotion between your toes. Shoes and socks  Wear clean socks or stockings every day. Make sure they are not too tight. Do not wear knee-high stockings since they may decrease blood flow to your legs.  Wear shoes that fit properly and have enough cushioning. Always look in your shoes before you put them on to be sure there are no objects inside.  To break in new shoes, wear them for just a few hours a day. This prevents injuries on your feet. Wounds, scrapes, corns, and calluses  Check your feet daily for blisters, cuts, bruises, sores, and redness. If you cannot see the bottom of your feet, use a mirror or ask someone for help.  Do not cut corns or calluses or try to remove them with medicine.  If you  find a minor scrape, cut, or break in the skin on your feet, keep it and the skin around it clean and dry. You may clean these areas with mild soap and water. Do not clean the area with peroxide, alcohol, or iodine.  If you have a wound, scrape, corn, or callus on your foot, look at it several times a day to make sure it is healing and not infected. Check for: ? Redness, swelling, or pain. ? Fluid or blood. ? Warmth. ? Pus or a bad smell. General instructions  Do not cross your legs. This may decrease blood flow to your feet.  Do not use heating pads or hot water bottles on your feet. They may burn your skin. If you have lost feeling in your feet or legs, you may not know this is happening until it is too late.  Protect your feet from hot and cold by wearing shoes, such as at the beach or on hot pavement.  Schedule a complete foot exam at least once a year (annually) or more often if you have foot problems. If you have foot problems, report any cuts, sores, or bruises to your health care provider immediately. Contact a health care provider if:  You have a medical condition that increases your risk of infection and you have any cuts, sores, or bruises on your feet.  You have an injury that is not   healing.  You have redness on your legs or feet.  You feel burning or tingling in your legs or feet.  You have pain or cramps in your legs and feet.  Your legs or feet are numb.  Your feet always feel cold.  You have pain around a toenail. Get help right away if:  You have a wound, scrape, corn, or callus on your foot and: ? You have pain, swelling, or redness that gets worse. ? You have fluid or blood coming from the wound, scrape, corn, or callus. ? Your wound, scrape, corn, or callus feels warm to the touch. ? You have pus or a bad smell coming from the wound, scrape, corn, or callus. ? You have a fever. ? You have a red line going up your leg. Summary  Check your feet every day  for cuts, sores, red spots, swelling, and blisters.  Moisturize feet and legs daily.  Wear shoes that fit properly and have enough cushioning.  If you have foot problems, report any cuts, sores, or bruises to your health care provider immediately.  Schedule a complete foot exam at least once a year (annually) or more often if you have foot problems. This information is not intended to replace advice given to you by your health care provider. Make sure you discuss any questions you have with your health care provider. Document Released: 04/13/2000 Document Revised: 05/29/2017 Document Reviewed: 05/18/2016 Elsevier Patient Education  2020 Elsevier Inc.   Onychomycosis/Fungal Toenails  WHAT IS IT? An infection that lies within the keratin of your nail plate that is caused by a fungus.  WHY ME? Fungal infections affect all ages, sexes, races, and creeds.  There may be many factors that predispose you to a fungal infection such as age, coexisting medical conditions such as diabetes, or an autoimmune disease; stress, medications, fatigue, genetics, etc.  Bottom line: fungus thrives in a warm, moist environment and your shoes offer such a location.  IS IT CONTAGIOUS? Theoretically, yes.  You do not want to share shoes, nail clippers or files with someone who has fungal toenails.  Walking around barefoot in the same room or sleeping in the same bed is unlikely to transfer the organism.  It is important to realize, however, that fungus can spread easily from one nail to the next on the same foot.  HOW DO WE TREAT THIS?  There are several ways to treat this condition.  Treatment may depend on many factors such as age, medications, pregnancy, liver and kidney conditions, etc.  It is best to ask your doctor which options are available to you.  1. No treatment.   Unlike many other medical concerns, you can live with this condition.  However for many people this can be a painful condition and may lead to  ingrown toenails or a bacterial infection.  It is recommended that you keep the nails cut short to help reduce the amount of fungal nail. 2. Topical treatment.  These range from herbal remedies to prescription strength nail lacquers.  About 40-50% effective, topicals require twice daily application for approximately 9 to 12 months or until an entirely new nail has grown out.  The most effective topicals are medical grade medications available through physicians offices. 3. Oral antifungal medications.  With an 80-90% cure rate, the most common oral medication requires 3 to 4 months of therapy and stays in your system for a year as the new nail grows out.  Oral antifungal medications do require   blood work to make sure it is a safe drug for you.  A liver function panel will be performed prior to starting the medication and after the first month of treatment.  It is important to have the blood work performed to avoid any harmful side effects.  In general, this medication safe but blood work is required. 4. Laser Therapy.  This treatment is performed by applying a specialized laser to the affected nail plate.  This therapy is noninvasive, fast, and non-painful.  It is not covered by insurance and is therefore, out of pocket.  The results have been very good with a 80-95% cure rate.  [facility] is the only practice in the area to offer this therapy. 5. Permanent Nail Avulsion.  Removing the entire nail so that a new nail will not grow back. 

## 2018-11-27 NOTE — Progress Notes (Signed)
Subjective:  Jeremiah Conway presents to clinic today with cc of  painful, thick, discolored, elongated toenails 1-5 b/l that become tender and cannot cut because of thickness. Pain is aggravated when wearing enclosed shoe gear and responds to periodic professional debridement.  He relates rough skin surrounding his fleshy plantar papule and would like that filed.  Dellia Cloud, MD is his PCP.    Current Outpatient Medications:  .  albuterol (VENTOLIN HFA) 108 (90 Base) MCG/ACT inhaler, INHALE 1 TO 2 PUFFS INTO THE LUNGS EVERY 6 HOURS AS NEEDED FOR WHEEZING OR SHORTNESS OF BREATH, Disp: 54 g, Rfl: 3 .  aspirin 81 MG tablet, Take 1 tablet (81 mg total) by mouth daily., Disp: 30 tablet, Rfl: 11 .  atorvastatin (LIPITOR) 80 MG tablet, TAKE 1 TABLET BY MOUTH DAILY, Disp: 90 tablet, Rfl: 3 .  BIDIL 20-37.5 MG tablet, TAKE 1 TABLET BY MOUTH TWICE DAILY, Disp: 180 tablet, Rfl: 3 .  Elastic Bandages & Supports (V-5 HIGH COMPRESSION HOSE) MISC, 1pair, Wear them while driving, walking, standing, Disp: 1 each, Rfl: 1 .  metFORMIN (GLUCOPHAGE) 1000 MG tablet, Take 1 tablet (1,000 mg total) by mouth 2 (two) times daily with a meal. TAKE 1 TABLET BY MOUTH TWICE DAILY WITH A MEAL, Disp: , Rfl:  .  metoprolol (TOPROL XL) 200 MG 24 hr tablet, Take 1 tablet (200 mg total) by mouth daily., Disp: 90 tablet, Rfl: 3 .  montelukast (SINGULAIR) 10 MG tablet, TAKE 1 TABLET(10 MG) BY MOUTH AT BEDTIME, Disp: 90 tablet, Rfl: 3 .  Multiple Vitamin (ONE-A-DAY 55 PLUS PO), Take 1 tablet by mouth daily., Disp: , Rfl:  .  oxymetazoline (AFRIN) 0.05 % nasal spray, Place 1 spray into both nostrils 2 (two) times daily as needed for congestion., Disp: , Rfl:  .  spironolactone (ALDACTONE) 25 MG tablet, Take 1 tablet (25 mg total) by mouth daily., Disp: 90 tablet, Rfl: 3 .  tamsulosin (FLOMAX) 0.4 MG CAPS capsule, TAKE 1 CAPSULE BY MOUTH DAILY AFTER BREAKFAST, Disp: 90 capsule, Rfl: 0 .  torsemide (DEMADEX) 20 MG tablet, TAKE 2  TABLETS BY MOUTH EVERY MORNING, AND 1 TABLET EVERY EVENING, Disp: 270 tablet, Rfl: 2   Allergies  Allergen Reactions  . Ace Inhibitors Swelling    REACTION: Angioedema, tongue swelling to benazepril  . Angiotensin Receptor Blockers Swelling     Objective: There were no vitals filed for this visit.  Physical Examination:  Vascular Examination: Capillary refill time <3 seconds x 10 digits.  Palpable DP/PT pulses b/l.  Digital hair present b/l.  Bilateral lower extremity edema noted b/l. No pain with calf compression. No increased warmth.   Skin temperature gradient WNL b/l.  Dermatological Examination: Skin with normal turgor, texture and tone b/l.  No open wounds b/l.  No interdigital macerations noted b/l.  Elongated, thick, discolored brittle toenails with subungual debris and pain on dorsal palpation of nailbeds 1-5 b/l.  Fleshy papule plantar aspect left forefoot. Rough skin around periphery of lesion. No erythema, no edema, no drainage, no flocculence.  Musculoskeletal Examination: Muscle strength 5/5 to all muscle groups b/l.  Pes planus foot deformity b/l.  No pain, crepitus or joint discomfort with active/passive ROM.  Neurological Examination: Sensation intact 5/5 b/l with 10 gram monofilament.  Vibratory sensation intact b/l.  Proprioceptive sensation intact b/l.  Assessment: Mycotic nail infection with pain 1-5 b/l  Plan: 1. Toenails 1-5 b/l were debrided in length and girth without iatrogenic laceration. 2. Dremel used to gently filed  rough edges of fleshy papule. 3. Continue soft, supportive shoe gear daily. 4. Report any pedal injuries to medical professional. 5. Follow up 3 months. 6. Patient/POA to call should there be a question/concern in there interim.

## 2018-11-28 ENCOUNTER — Telehealth: Payer: Self-pay

## 2018-11-28 NOTE — Telephone Encounter (Signed)
Ok thank you 

## 2018-11-28 NOTE — Telephone Encounter (Signed)
Returned call to [place] PCP is not Engineer, maintenance (IT). Will need Attending to sign for home oxygen order. Dr. Heber Cayuga was Attending when original order was placed. [place] will fax over required paperwork for Dr. Jodene Nam signature. Hubbard Hartshorn, RN, BSN

## 2018-11-28 NOTE — Telephone Encounter (Signed)
Christal with [facility] requesting attending name for the oxygen order. Please call back.

## 2018-12-02 NOTE — Telephone Encounter (Signed)
CMN placed in Dr. Jodene Nam box for review and signature. Hubbard Hartshorn, RN, BSN

## 2018-12-11 ENCOUNTER — Encounter: Payer: Self-pay | Admitting: Internal Medicine

## 2018-12-11 ENCOUNTER — Ambulatory Visit: Payer: Medicaid Other | Admitting: Internal Medicine

## 2018-12-11 ENCOUNTER — Other Ambulatory Visit: Payer: Self-pay

## 2018-12-11 VITALS — BP 124/82 | HR 103 | Temp 99.3°F | Ht 70.0 in | Wt 341.7 lb

## 2018-12-11 DIAGNOSIS — E1122 Type 2 diabetes mellitus with diabetic chronic kidney disease: Secondary | ICD-10-CM

## 2018-12-11 DIAGNOSIS — Z7984 Long term (current) use of oral hypoglycemic drugs: Secondary | ICD-10-CM | POA: Diagnosis not present

## 2018-12-11 DIAGNOSIS — G4733 Obstructive sleep apnea (adult) (pediatric): Secondary | ICD-10-CM

## 2018-12-11 DIAGNOSIS — I5022 Chronic systolic (congestive) heart failure: Secondary | ICD-10-CM | POA: Diagnosis not present

## 2018-12-11 DIAGNOSIS — N183 Chronic kidney disease, stage 3 unspecified: Secondary | ICD-10-CM

## 2018-12-11 DIAGNOSIS — Z79899 Other long term (current) drug therapy: Secondary | ICD-10-CM | POA: Diagnosis not present

## 2018-12-11 DIAGNOSIS — E1169 Type 2 diabetes mellitus with other specified complication: Secondary | ICD-10-CM

## 2018-12-11 DIAGNOSIS — R0601 Orthopnea: Secondary | ICD-10-CM

## 2018-12-11 DIAGNOSIS — E785 Hyperlipidemia, unspecified: Secondary | ICD-10-CM | POA: Diagnosis not present

## 2018-12-11 DIAGNOSIS — I5020 Unspecified systolic (congestive) heart failure: Secondary | ICD-10-CM

## 2018-12-11 DIAGNOSIS — I130 Hypertensive heart and chronic kidney disease with heart failure and stage 1 through stage 4 chronic kidney disease, or unspecified chronic kidney disease: Secondary | ICD-10-CM | POA: Diagnosis not present

## 2018-12-11 LAB — POCT GLYCOSYLATED HEMOGLOBIN (HGB A1C): Hemoglobin A1C: 6.8 % — AB (ref 4.0–5.6)

## 2018-12-11 LAB — GLUCOSE, CAPILLARY: Glucose-Capillary: 215 mg/dL — ABNORMAL HIGH (ref 70–99)

## 2018-12-11 NOTE — Patient Instructions (Signed)
Thank you, Jeremiah Conway for allowing [place] to provide your care today. Today we discussed heart failure, hypertension, diabetes, chronic kidney disease.    I have ordered BMP, CBC with differential, lipid panel, hemoglobin A1c labs for you. I will call if any are abnormal.    I have place a referrals to None   I have ordered the following tests: None today  We changes the following medications: Continue taking all of your medications as prescribed.  Make sure you keep your follow-up with cardiology.  Please follow-up in 1 month.    Should you have any questions or concerns please call the internal medicine clinic at [phone].    Marianna Payment, D.O. [facility]

## 2018-12-11 NOTE — Assessment & Plan Note (Addendum)
Mr. Jeremiah Conway last had a Telehealth visit with our clinic on 11/07/2018. During that visit the patient's Torsemide was changed form 20 mg daily to 40 mg in AM and 20 mg in PM for 1 weeks due to a 17 lb weight gains and increased lower extremity edema and dyspnea with exertion. He was suppose to follow up with our clinic at the end of the week, but I do not see any record of a subsequent visit. At today's visit, the patient's weight is still elevated at 341 lbs, which is roughly 20lbs greater then his weight in may. Patient does have a scale at homes, but has not been weighing himself. He admits to worsening dyspnea on exertion and lower extremity edema, but states that these symptoms improved when he was on the higher dose regimen of Torsemide. Continues to use 2 pillows at night to sleep. Patient was previously using a BiPAP at home for OSA, but has not been using it since starting oxygen therapy. He has a follow up appointment with Cardiology in 3 weeks.   Plan: - Restart patient on Torsemide 40 mg in AM and 20 mg in PM. - Asked patient to weigh himself daily, restrict fluid intake and decrease salt in diet to 2 grams. - We will follow up in 1 month to discuss weight management.

## 2018-12-11 NOTE — Assessment & Plan Note (Addendum)
Patient's most current hemoglobin A1c is 6.8.  He is currently on metformin 1000 mg total without side effects.  His current creatinine is 2.33 which is slightly elevated from baseline of 2.  His most recent GFR is 33.  Patient's microalbumin/creatinine ratio is moderately elevated at 119.  Patient was unable to tolerate ACE inhibitor due to angioedema.  Patient will need to make appointment for his yearly eye exam.  Plan: -Considering the patient's GFR is 33, we need may need to change his metformin at his follow-up appointment in 1 month. -Consider switching to a short acting sulfonylureas such as glipizide or glimepiride.

## 2018-12-12 LAB — ANEMIA PROFILE B
Basophils Absolute: 0.1 10*3/uL (ref 0.0–0.2)
Basos: 1 %
EOS (ABSOLUTE): 0.1 10*3/uL (ref 0.0–0.4)
Eos: 1 %
Ferritin: 28 ng/mL — ABNORMAL LOW (ref 30–400)
Folate: 19.4 ng/mL (ref >3.0)
Hematocrit: 35.4 % — ABNORMAL LOW (ref 37.5–51.0)
Hemoglobin: 11.2 g/dL — ABNORMAL LOW (ref 13.0–17.7)
Immature Grans (Abs): 0 10*3/uL (ref 0.0–0.1)
Immature Granulocytes: 1 %
Iron Saturation: 14 % — ABNORMAL LOW (ref 15–55)
Iron: 46 ug/dL (ref 38–169)
Lymphocytes Absolute: 0.7 10*3/uL (ref 0.7–3.1)
Lymphs: 12 %
MCH: 27.5 pg (ref 26.6–33.0)
MCHC: 31.6 g/dL (ref 31.5–35.7)
MCV: 87 fL (ref 79–97)
Monocytes Absolute: 0.4 10*3/uL (ref 0.1–0.9)
Monocytes: 7 %
Neutrophils Absolute: 4.7 10*3/uL (ref 1.4–7.0)
Neutrophils: 78 %
Platelets: 210 10*3/uL (ref 150–450)
RBC: 4.07 x10E6/uL — ABNORMAL LOW (ref 4.14–5.80)
RDW: 14.8 % (ref 11.6–15.4)
Retic Ct Pct: 2 % (ref 0.6–2.6)
Total Iron Binding Capacity: 339 ug/dL (ref 250–450)
UIBC: 293 ug/dL (ref 111–343)
Vitamin B-12: 289 pg/mL (ref 232–1245)
WBC: 6 10*3/uL (ref 3.4–10.8)

## 2018-12-12 LAB — LIPID PANEL
Chol/HDL Ratio: 3.9 ratio (ref 0.0–5.0)
Cholesterol, Total: 136 mg/dL (ref 100–199)
HDL: 35 mg/dL — ABNORMAL LOW (ref >39)
LDL Calculated: 74 mg/dL (ref 0–99)
Triglycerides: 135 mg/dL (ref 0–149)
VLDL Cholesterol Cal: 27 mg/dL (ref 5–40)

## 2018-12-12 LAB — BMP8+ANION GAP
Anion Gap: 15 mmol/L (ref 10.0–18.0)
BUN/Creatinine Ratio: 11 (ref 10–24)
BUN: 26 mg/dL (ref 8–27)
CO2: 30 mmol/L — ABNORMAL HIGH (ref 20–29)
Calcium: 10.2 mg/dL (ref 8.6–10.2)
Chloride: 98 mmol/L (ref 96–106)
Creatinine, Ser: 2.33 mg/dL — ABNORMAL HIGH (ref 0.76–1.27)
GFR calc Af Amer: 33 mL/min/{1.73_m2} — ABNORMAL LOW (ref >59)
GFR calc non Af Amer: 29 mL/min/{1.73_m2} — ABNORMAL LOW (ref >59)
Glucose: 164 mg/dL — ABNORMAL HIGH (ref 65–99)
Potassium: 4.5 mmol/L (ref 3.5–5.2)
Sodium: 143 mmol/L (ref 134–144)

## 2018-12-12 LAB — MICROALBUMIN / CREATININE URINE RATIO
Creatinine, Urine: 48.4 mg/dL
Microalb/Creat Ratio: 119 mg/g creat — ABNORMAL HIGH (ref 0–29)
Microalbumin, Urine: 57.4 ug/mL

## 2018-12-14 NOTE — Progress Notes (Signed)
   CC: Heart failure with reduced ejection fraction  HPI:  Mr.Mccrae S Faries is a 63 y.o. male with a pertinent past medical history Hypertension, Diabetes, OSA, DM, and CKD (Stage III) and presents today for a follow up for his heart failure with reduced ejection fraction. Patient was diagnosed in May 2019 by echo which showed an ejection fraction between 30% to 35%. Patient was started on metoprolol and spironolactone but was unable to tolerate ACE inhibitor or Entresto due to angioedema.  The patient has had a difficult time managing his fluid status at home and does not routinely weigh himself each day. Patient was previously on 80 mg of Lasix a day but continued to retain fluid and was switched to 20 mg of torsemide daily. .   Patient followed up with our clinic via a TeleHealth visit in early July 2020 for worsening dyspnea on exertion and lower extremity swelling. His weight increased 17 pounds (324 lbs to 341 lbs) in 6 weeks. The patient noted confusion regarding the dosage of torsemide that he was previously prescribed. Due to his symptoms, the patient's torsemide was changed to 40 mg AM and 20 mg PM for 1 week and asked to follow-up in our clinic at that time. Patient presents today (12/11/2018) for further evaluation and management of his HFrEF.    Past Medical History:  Diagnosis Date  . Allergic rhinitis   . Angioedema   . Bunion   . Diabetes mellitus   . History of hematuria 11/20/2007  . Hyperlipidemia   . Hypertension   . Hypokalemia   . Obesity   . OSA on CPAP   . Pericarditis 1988  . RBBB (right bundle branch block with left anterior fascicular block)   . Renal insufficiency    Cr 1.2 baseline  . Tobacco abuse    Stopped in 2002. Now smokes 1.5ppd  . Unspecified venous (peripheral) insufficiency      Review of Systems: Review of Systems  Constitutional: Negative for fever, malaise/fatigue and weight loss.  Respiratory: Positive for shortness of breath (with  exertion). Negative for cough, sputum production and wheezing.   Cardiovascular: Positive for orthopnea (needs 2 pillows at night to sleep) and leg swelling. Negative for chest pain and palpitations.     Vitals:   12/11/18 1052  BP: 124/82  Pulse: (!) 103  Temp: 99.3 F (37.4 C)  TempSrc: Oral  SpO2: 100%  Weight: (!) 341 lb 11.2 oz (155 kg)  Height: 5\' 10"  (1.778 m)     Physical Exam: Physical Exam  Constitutional: He is oriented to person, place, and time.  Neck: No hepatojugular reflux and no JVD present.  Cardiovascular: Normal rate, regular rhythm, normal heart sounds and intact distal pulses. Exam reveals no gallop and no friction rub.  No murmur heard. Pulmonary/Chest: Accessory muscle usage present. No respiratory distress. He has decreased breath sounds. He exhibits no tenderness.  Abdominal: There is no abdominal tenderness.  Musculoskeletal:        General: Edema (2+) present.  Neurological: He is alert and oriented to person, place, and time.     Assessment & Plan:   See Encounters Tab for problem based charting.  Patient seen with Dr. Rebeca Alert

## 2018-12-15 NOTE — Progress Notes (Signed)
Internal Medicine Clinic Attending  I saw and evaluated the patient.  I personally confirmed the key portions of the history and exam documented by Dr. Marianna Payment and I reviewed pertinent patient test results.  The assessment, diagnosis, and plan were formulated together and I agree with the documentation in the resident's note.  We also asked home health to go out and help him resume use of his nighttime bipap. Ferritin is low at 28, would likely benefit from IV iron (FAIR trial), will arrange with him. Will continue to require frequent visits for medication optimization, as he is quite tenuous right now.   Lenice Pressman, M.D., Ph.D.

## 2019-01-01 ENCOUNTER — Telehealth (INDEPENDENT_AMBULATORY_CARE_PROVIDER_SITE_OTHER): Payer: Medicaid Other | Admitting: Cardiology

## 2019-01-01 ENCOUNTER — Other Ambulatory Visit: Payer: Self-pay

## 2019-01-01 VITALS — BP 130/75 | HR 86 | Temp 97.9°F | Ht 70.0 in | Wt 342.0 lb

## 2019-01-01 DIAGNOSIS — E1159 Type 2 diabetes mellitus with other circulatory complications: Secondary | ICD-10-CM

## 2019-01-01 DIAGNOSIS — I10 Essential (primary) hypertension: Secondary | ICD-10-CM

## 2019-01-01 DIAGNOSIS — I152 Hypertension secondary to endocrine disorders: Secondary | ICD-10-CM

## 2019-01-01 DIAGNOSIS — I5020 Unspecified systolic (congestive) heart failure: Secondary | ICD-10-CM | POA: Diagnosis not present

## 2019-01-01 NOTE — Progress Notes (Signed)
Virtual Visit via Video Note   This visit type was conducted due to national recommendations for restrictions regarding the COVID-19 Pandemic (e.g. social distancing) in an effort to limit this patient's exposure and mitigate transmission in our community.  Due to his co-morbid illnesses, this patient is at least at moderate risk for complications without adequate follow up.  This format is felt to be most appropriate for this patient at this time.  All issues noted in this document were discussed and addressed.  A limited physical exam was performed with this format.  Please refer to the patient's chart for his consent to telehealth for [facility].   Date:  01/01/2019   ID:  Jeremiah Conway, DOB 03/01/1956, MRN [id number]  Patient Location: Home Provider Location: Office  PCP:  Marianna Payment, MD  Cardiologist:  Candee Furbish, MD  Electrophysiologist:  None   Evaluation Performed:  Follow-Up Visit  Chief Complaint: Heart failure follow-up  History of Present Illness:    Jeremiah Conway is a 63 y.o. male with  type 2 diabetes, hypertension, ejection fraction of 30 to 35%, obstructive sleep apnea, morbid obesity, chronic kidney disease stage III creatinine rangingaound 2.  Diagnosed in May 6734 with systolic heart failure. Presumed etiology from possible prior inferior wall MI indicated on ECG, obstructive sleep apnea, morbid obesity combination. Echo also showed dilation of right atrium. Has begun goal-directed therapy, spironolactone, metoprolol. Also utilizes nasal cannula oxygen. Weight has been an issue. Has increased as an outpatient despite diuretics. Cannot be on ACE inhibitor because of angioedema, no Entresto because of angioedema. On metoprolol.  He was switched from Lasix 80 mg a day to torsemide 40/20 mg a day given his increase in weight gain. 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  Chronic O2.   Had to walk a distance feels more winded.  Denies  any chest discomfort with this.  His weight overall has been fairly stable.  He has a blood pressure cuff, thermometer.  He says he is interested in getting a pulse oximeter.  Has been seeing nephrology.  The patient does not have symptoms concerning for COVID-19 infection (fever, chills, cough, or new shortness of breath).    Past Medical History:  Diagnosis Date   Allergic rhinitis    Angioedema    Bunion    Diabetes mellitus    History of hematuria 11/20/2007   Hyperlipidemia    Hypertension    Hypokalemia    Obesity    OSA on CPAP    Pericarditis 1988   RBBB (right bundle branch block with left anterior fascicular block)    Renal insufficiency    Cr 1.2 baseline   Tobacco abuse    Stopped in 2002. Now smokes 1.5ppd   Unspecified venous (peripheral) insufficiency    Past Surgical History:  Procedure Laterality Date   fluid retention  1988   removed from abd   MOUTH SURGERY N/A 07.08.2014   RIGHT/LEFT HEART CATH AND CORONARY ANGIOGRAPHY N/A 12/16/2017   Procedure: RIGHT/LEFT HEART CATH AND CORONARY ANGIOGRAPHY;  Surgeon: Jettie Booze, MD;  Location: [place] [facility];  Service: Cardiovascular;  Laterality: N/A;     Current Meds  Medication Sig   albuterol (VENTOLIN HFA) 108 (90 Base) MCG/ACT inhaler INHALE 1 TO 2 PUFFS INTO THE LUNGS EVERY 6 HOURS AS NEEDED FOR WHEEZING OR SHORTNESS OF BREATH   aspirin 81 MG tablet Take 1 tablet (81 mg total) by mouth daily.   atorvastatin (LIPITOR) 80  MG tablet TAKE 1 TABLET BY MOUTH DAILY   BIDIL 20-37.5 MG tablet TAKE 1 TABLET BY MOUTH TWICE DAILY   Elastic Bandages & Supports (V-5 HIGH COMPRESSION HOSE) MISC 1pair, Wear them while driving, walking, standing   metFORMIN (GLUCOPHAGE) 1000 MG tablet Take 1 tablet (1,000 mg total) by mouth 2 (two) times daily with a meal. TAKE 1 TABLET BY MOUTH TWICE DAILY WITH A MEAL   metoprolol (TOPROL XL) 200 MG 24 hr tablet Take 1 tablet (200 mg total) by mouth daily.     montelukast (SINGULAIR) 10 MG tablet TAKE 1 TABLET(10 MG) BY MOUTH AT BEDTIME   Multiple Vitamin (ONE-A-DAY 55 PLUS PO) Take 1 tablet by mouth daily.   oxymetazoline (AFRIN) 0.05 % nasal spray Place 1 spray into both nostrils 2 (two) times daily as needed for congestion.   spironolactone (ALDACTONE) 25 MG tablet Take 1 tablet (25 mg total) by mouth daily.   tamsulosin (FLOMAX) 0.4 MG CAPS capsule TAKE 1 CAPSULE BY MOUTH DAILY AFTER BREAKFAST   torsemide (DEMADEX) 20 MG tablet TAKE 2 TABLETS BY MOUTH EVERY MORNING, AND 1 TABLET EVERY EVENING     Allergies:   Ace inhibitors and Angiotensin receptor blockers   Social History   Tobacco Use   Smoking status: Former Smoker    Packs/day: 0.50    Types: Cigarettes    Quit date: 06/10/2012    Years since quitting: 6.5   Smokeless tobacco: Never Used   Tobacco comment: given quit line info  Substance Use Topics   Alcohol use: Yes    Alcohol/week: 0.0 standard drinks    Comment: On holidays.   Drug use: No     Family Hx: The patient's family history includes Colon cancer in his maternal grandfather; Colon polyps in his maternal grandfather; Diabetes in his father; Heart disease in his father; Hypertension in his father and mother; Ovarian cancer in his maternal aunt.  ROS:   Please see the history of present illness.    Denies any fevers chills nausea vomiting syncope All other systems reviewed and are negative.   Prior CV studies:   The following studies were reviewed today:  ECHO 09/30/17:  - Left ventricle: Diffuse hypokinesis worse in the inferior wall. The cavity size was mildly dilated. Wall thickness was increased in a pattern of moderate LVH. Systolic function was moderately to severely reduced. The estimated ejection fraction was in the range of30% to 35%. Left ventricular diastolic function parameters were normal. - Aortic valve: There was mild regurgitation. - Mitral valve: There was mild  regurgitation. - Left atrium: The atrium was moderately dilated. - Right ventricle: The cavity size was mildly dilated. - Right atrium: The atrium was mildly dilated. - Atrial septum: No defect or patent foramen ovale was identified. - Pulmonary arteries: PA peak pressure: 49 mm Hg (S).  Cath 12/16/17:  Mid LAD lesion is 10% stenosed.  LV end diastolic pressure is mildly elevated.  There is no aortic valve stenosis.  Hemodynamic findings consistent with moderate pulmonary hypertension.  No signficant CAD.  Continue medical therapy for nonischemic cardiomyopathy.  CO 7.7 L/min; CI 3.01; mean PA pressure 42 mm Hg; mean PCWP 24 mm Hg. Ao sat on RA 84%; PA sat 58%.    Continue medical therapy for nonischemic cardiomyopathy.  Labs/Other Tests and Data Reviewed:    EKG:  No new EKG  Recent Labs: 12/11/2018: BUN 26; Creatinine, Ser 2.33; Hemoglobin 11.2; Platelets 210; Potassium 4.5; Sodium 143   Recent Lipid Panel  Lab Results  Component Value Date/Time   CHOL 136 12/11/2018 12:07 PM   TRIG 135 12/11/2018 12:07 PM   HDL 35 (L) 12/11/2018 12:07 PM   CHOLHDL 3.9 12/11/2018 12:07 PM   CHOLHDL 5.1 09/16/2013 02:39 PM   LDLCALC 74 12/11/2018 12:07 PM    Wt Readings from Last 3 Encounters:  01/01/19 (!) 342 lb (155.1 kg)  12/11/18 (!) 341 lb 11.2 oz (155 kg)  09/24/18 (!) 324 lb (147 kg)     Objective:    Vital Signs:  BP 130/75    Pulse 86    Temp 97.9 F (36.6 C)    Ht 5\' 10"  (1.778 m)    Wt (!) 342 lb (155.1 kg)    BMI 49.07 kg/m    VITAL SIGNS:  reviewed GEN:  no acute distress EYES:  sclerae anicteric, EOMI - Extraocular Movements Intact RESPIRATORY:  normal respiratory effort, symmetric expansion SKIN:  no rash, lesions or ulcers. MUSCULOSKELETAL:  no obvious deformities. NEURO:  alert and oriented x 3, no obvious focal deficit PSYCH:  normal affect   Overweight, wearing oxygen  ASSESSMENT & PLAN:    Chronic systolic heart failure -EF 30 to 35%  currently on metoprolol, spironolactone, torsemide.  Unable to take ACE inhibitor or angiotensin receptor blocker/Entresto because of prior angioedema.  Etiology of heart failure is nonischemic.   -  Demadex, improved. Switched from Lasix in 2019  -  Demadex 40 mg in the morning and 20 mg in the evening (this was decreased because of his constant creatinine of about 1.9-2.0).  1.5 L a day.  Reiterated this to him again today.  Less than 2 g salt.  Daily weights.   -No changes BiDil twice daily.  Morbid obesity -Continue to encourage weight loss.  40 pounds of fluid has been taken off at this point.  No changes today.  Weight has been stable. Still encouraged weight loss.   Diabetes with hypertension/obstructive sleep apnea/obesity hypoventilation syndrome/chronic kidney disease - Hemoglobin A1c 6.1.  Well-controlled.  Home oxygen. - Creatinine ranging around 2.  I think his new baseline will be 2. Sees nephrology.   Right bundle branch block/inferior infarct pattern -Abnormal EKG noted.  No syncope.  Stable. Monitor  COVID-19 Education: The signs and symptoms of COVID-19 were discussed with the patient and how to seek care for testing (follow up with PCP or arrange E-visit).  The importance of social distancing was discussed today.  Time:   Today, I have spent 15 minutes with the patient with telehealth technology discussing the above problems.     Medication Adjustments/Labs and Tests Ordered: Current medicines are reviewed at length with the patient today.  Concerns regarding medicines are outlined above.   Tests Ordered: No orders of the defined types were placed in this encounter.   Medication Changes: No orders of the defined types were placed in this encounter.   Follow Up:  Virtual Visit or In Person in 3 month(s)  Signed, Donato Schultz, MD  01/01/2019 10:04 AM    [facility]

## 2019-01-01 NOTE — Patient Instructions (Addendum)
Medication Instructions:  Please increase your Torsemide to 40 mg twice a day for 3 days then return to 40 mg in the morning and 20 mg in the afternoon.  Continue all other medications as listed.  If you need a refill on your cardiac medications before your next appointment, please call your pharmacy.   Follow-Up: Follow up in 3 months (due 03/2019) with Cecilie Kicks, NP.  You will be contacted to be scheduled for this appointment.  Thank you for choosing [facility]!!

## 2019-01-06 ENCOUNTER — Other Ambulatory Visit: Payer: Self-pay | Admitting: *Deleted

## 2019-01-06 DIAGNOSIS — I5020 Unspecified systolic (congestive) heart failure: Secondary | ICD-10-CM

## 2019-01-07 MED ORDER — SPIRONOLACTONE 25 MG PO TABS: 25.0000 mg | ORAL_TABLET | Freq: Every day | ORAL | 3 refills | Status: DC

## 2019-02-11 ENCOUNTER — Other Ambulatory Visit: Payer: Self-pay | Admitting: *Deleted

## 2019-02-11 DIAGNOSIS — I5020 Unspecified systolic (congestive) heart failure: Secondary | ICD-10-CM

## 2019-02-11 MED ORDER — METOPROLOL SUCCINATE ER 200 MG PO TB24: 200.0000 mg | ORAL_TABLET | Freq: Every day | ORAL | 3 refills | Status: DC

## 2019-03-01 ENCOUNTER — Other Ambulatory Visit: Payer: Self-pay | Admitting: Internal Medicine

## 2019-03-02 ENCOUNTER — Other Ambulatory Visit: Payer: Self-pay

## 2019-03-02 ENCOUNTER — Encounter: Payer: Self-pay | Admitting: Podiatry

## 2019-03-02 ENCOUNTER — Ambulatory Visit: Payer: Medicaid Other | Admitting: Podiatry

## 2019-03-02 DIAGNOSIS — M79674 Pain in right toe(s): Secondary | ICD-10-CM | POA: Diagnosis not present

## 2019-03-02 DIAGNOSIS — M79675 Pain in left toe(s): Secondary | ICD-10-CM

## 2019-03-02 DIAGNOSIS — B351 Tinea unguium: Secondary | ICD-10-CM

## 2019-03-03 NOTE — Telephone Encounter (Signed)
Overdue for PCP appointment.  Please schedule first available for heart failure management

## 2019-03-06 NOTE — Progress Notes (Signed)
Subjective:  Jeremiah Conway presents to clinic today for preventative diabetic footcare with h/o painful mycotic toenails b/l. Pain is aggravated when wearing enclosed shoe gear.  He voices no new pedal problems on today's visit.  Current Outpatient Medications on File Prior to Visit  Medication Sig Dispense Refill  . albuterol (VENTOLIN HFA) 108 (90 Base) MCG/ACT inhaler INHALE 1 TO 2 PUFFS INTO THE LUNGS EVERY 6 HOURS AS NEEDED FOR WHEEZING OR SHORTNESS OF BREATH 54 g 3  . aspirin 81 MG tablet Take 1 tablet (81 mg total) by mouth daily. 30 tablet 11  . atorvastatin (LIPITOR) 80 MG tablet TAKE 1 TABLET BY MOUTH DAILY 90 tablet 3  . BIDIL 20-37.5 MG tablet TAKE 1 TABLET BY MOUTH TWICE DAILY 180 tablet 3  . Elastic Bandages & Supports (V-5 HIGH COMPRESSION HOSE) MISC 1pair, Wear them while driving, walking, standing 1 each 1  . metFORMIN (GLUCOPHAGE) 1000 MG tablet Take 1 tablet (1,000 mg total) by mouth 2 (two) times daily with a meal. TAKE 1 TABLET BY MOUTH TWICE DAILY WITH A MEAL    . metoprolol (TOPROL XL) 200 MG 24 hr tablet Take 1 tablet (200 mg total) by mouth daily. 90 tablet 3  . montelukast (SINGULAIR) 10 MG tablet TAKE 1 TABLET(10 MG) BY MOUTH AT BEDTIME 90 tablet 3  . Multiple Vitamin (ONE-A-DAY 55 PLUS PO) Take 1 tablet by mouth daily.    Marland Kitchen oxymetazoline (AFRIN) 0.05 % nasal spray Place 1 spray into both nostrils 2 (two) times daily as needed for congestion.    Marland Kitchen spironolactone (ALDACTONE) 25 MG tablet Take 1 tablet (25 mg total) by mouth daily. 90 tablet 3  . tamsulosin (FLOMAX) 0.4 MG CAPS capsule TAKE 1 CAPSULE BY MOUTH DAILY AFTER BREAKFAST 90 capsule 3  . torsemide (DEMADEX) 20 MG tablet TAKE 2 TABLETS BY MOUTH EVERY MORNING, AND 1 TABLET EVERY EVENING 270 tablet 2   No current facility-administered medications on file prior to visit.      Allergies  Allergen Reactions  . Ace Inhibitors Swelling    REACTION: Angioedema, tongue swelling to benazepril  . Angiotensin  Receptor Blockers Swelling     Objective: There were no vitals filed for this visit.  Physical Examination:  Vascular Examination: Capillary refill time < 3 seconds b/l.  DP pulses palpable b/l.   PT pulses palpable b/l.  Digital hair sparse b/l.  No edema noted b/l.  Chronic BLE edema. No open wounds. No pain with calf compression. No LE cellulitis.  Skin temperature gradient WNL b/l.  Dermatological Examination: Skin with normal texture and tone b/l.  No open wounds b/l.  No interdigital macerations noted b/l.  Elongated, thick, discolored brittle toenails with subungual debris and pain on dorsal palpation of nailbeds 1-5 b/l.  Fleshy papule plantar aspect left foot. No changes in size/appearance of lesion. Rough skin around periphery of lesion. No erythema, no edema, no drainage, no flocculence.   Musculoskeletal Examination: Muscle strength 5/5 to all muscle groups b/l.  No pain, crepitus or joint discomfort with active/passive ROM.  Neurological Examination: Sensation intact 5/5 b/l with 10 gram monofilament.  Vibratory sensation intact b/l.  Proprioceptive sensation intact b/l.  Assessment: Mycotic nail infection with pain 1-5 b/l  Plan: 1. Toenails 1-5 b/l were debrided in length and girth without iatrogenic laceration.  2. Used burr to gently file rough skin without incident. 3. Continue soft, supportive shoe gear daily. 4. Report any pedal injuries to medical professional. 5. Follow up 3  months. 6. Patient/POA to call should there be a question/concern in there interim.

## 2019-03-30 ENCOUNTER — Telehealth: Payer: Self-pay | Admitting: Internal Medicine

## 2019-03-30 ENCOUNTER — Telehealth: Payer: Self-pay | Admitting: *Deleted

## 2019-03-30 NOTE — Telephone Encounter (Signed)
Pt called and wanted a new script sent to pharm for torsemide, to increase evening dose, it was prescribed by cardiology and reason being is kidney function, he was advised to call dr Marlou Porch office and let them know he had been taking double dose at night. He stated he would rather take 2 at night and could imc just change it, explained to him that there is a valid reason for not and he would need to call cardiology, will send to l ingold np at cardiology office

## 2019-03-30 NOTE — Telephone Encounter (Signed)
Pls contact [phone] regarding medicine

## 2019-03-30 NOTE — Telephone Encounter (Signed)
He could try 60mg  at once if he would like Candee Furbish, MD

## 2019-03-30 NOTE — Telephone Encounter (Signed)

## 2019-03-30 NOTE — Telephone Encounter (Signed)
Dr. Marlou Porch it looks like you want Mr Richman on torsemide 40 in AM and 20 in evening - could he take 60 mg all at one time?   Though what I think he wants is 40 BID I see him on the 3rd, virtual visit.

## 2019-04-02 ENCOUNTER — Other Ambulatory Visit: Payer: Self-pay

## 2019-04-02 ENCOUNTER — Telehealth (INDEPENDENT_AMBULATORY_CARE_PROVIDER_SITE_OTHER): Payer: Medicaid Other | Admitting: Cardiology

## 2019-04-02 ENCOUNTER — Ambulatory Visit: Payer: Medicaid Other

## 2019-04-02 ENCOUNTER — Encounter: Payer: Self-pay | Admitting: Cardiology

## 2019-04-02 VITALS — BP 128/92 | HR 84 | Temp 96.7°F | Ht 70.0 in | Wt 334.4 lb

## 2019-04-02 DIAGNOSIS — I5020 Unspecified systolic (congestive) heart failure: Secondary | ICD-10-CM

## 2019-04-02 DIAGNOSIS — I10 Essential (primary) hypertension: Secondary | ICD-10-CM

## 2019-04-02 DIAGNOSIS — I428 Other cardiomyopathies: Secondary | ICD-10-CM

## 2019-04-02 MED ORDER — TORSEMIDE 20 MG PO TABS: 40.0000 mg | ORAL_TABLET | Freq: Two times a day (BID) | ORAL | 3 refills | Status: DC

## 2019-04-02 NOTE — Progress Notes (Signed)
Virtual Visit via Telephone Note   This visit type was conducted due to national recommendations for restrictions regarding the COVID-19 Pandemic (e.g. social distancing) in an effort to limit this patient's exposure and mitigate transmission in our community.  Due to his co-morbid illnesses, this patient is at least at moderate risk for complications without adequate follow up.  This format is felt to be most appropriate for this patient at this time.  The patient did not have access to video technology/had technical difficulties with video requiring transitioning to audio format only (telephone).  All issues noted in this document were discussed and addressed.  No physical exam could be performed with this format.  Please refer to the patient's chart for his  consent to telehealth for [facility].   Date:  04/03/2019   ID:  [name], DOB 02-11-1956, MRN [id number]  Patient Location: Home Provider Location: Office  PCP:  Dellia Cloud, MD  Cardiologist:  Donato Schultz, MD  Electrophysiologist:  None   Evaluation Performed:  Follow-Up Visit  Chief Complaint:  Edema   History of Present Illness:    Jeremiah Conway is a 63 y.o. male with heart failure NICM.  type 2 diabetes, hypertension, ejection fraction of 30 to 35%, obstructive sleep apnea, morbid obesity, chronic kidney disease stage III creatinine rangingaound 2.  Diagnosed in May 2019 with systolic heart failure. Presumed etiology from possible prior inferior wall MI indicated on ECG, obstructive sleep apnea, morbid obesity combination. Echo also showed dilation of right atrium. Has begun goal-directed therapy, spironolactone, metoprolol. Also utilizes nasal cannula oxygen. Weight has been an issue. Has increased as an outpatient despite diuretics. Cannot be on ACE inhibitor because of angioedema, no Entresto because of angioedema. On metoprolol.  He was switched from Lasix 80 mg a day to torsemide 40/20 mg a day  given his increase in weight gain. 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  Chronic O2. No CPAP since 02 was added.  Chronic RBBB  Had to walk a distance feels more winded still.  Denies any chest discomfort with this.  His weight overall has been fairly stable.  He has a blood pressure cuff, thermometer.  He says he is interested in getting a pulse oximeter.  Has been seeing nephrology.  Last visit 01/01/19 unable to take ACE inhibitor or angiotensin receptor blocker/Entresto because of prior angioedema --demadex was decreased due to kidney function.  Is followed by renal.    Today he tells me with decrease in torsemide he had significant edema -nephrology increased torsemide back up and edema has resolved.  No chest pain and SOB only with exertion.   The patient does not have symptoms concerning for COVID-19 infection (fever, chills, cough, or new shortness of breath).    Past Medical History:  Diagnosis Date   Allergic rhinitis    Angioedema    Bunion    Diabetes mellitus    History of hematuria 11/20/2007   Hyperlipidemia    Hypertension    Hypokalemia    Obesity    OSA on CPAP    Pericarditis 1988   RBBB (right bundle branch block with left anterior fascicular block)    Renal insufficiency    Cr 1.2 baseline   Tobacco abuse    Stopped in 2002. Now smokes 1.5ppd   Unspecified venous (peripheral) insufficiency    Past Surgical History:  Procedure Laterality Date   fluid retention  1988   removed from abd   MOUTH SURGERY  N/A 07.08.2014   RIGHT/LEFT HEART CATH AND CORONARY ANGIOGRAPHY N/A 12/16/2017   Procedure: RIGHT/LEFT HEART CATH AND CORONARY ANGIOGRAPHY;  Surgeon: Jettie Booze, MD;  Location: [facility];  Service: Cardiovascular;  Laterality: N/A;     Current Meds  Medication Sig   albuterol (VENTOLIN HFA) 108 (90 Base) MCG/ACT inhaler INHALE 1 TO 2 PUFFS INTO THE LUNGS EVERY 6 HOURS AS NEEDED FOR WHEEZING OR  SHORTNESS OF BREATH   aspirin 81 MG tablet Take 1 tablet (81 mg total) by mouth daily.   atorvastatin (LIPITOR) 80 MG tablet TAKE 1 TABLET BY MOUTH DAILY   BIDIL 20-37.5 MG tablet TAKE 1 TABLET BY MOUTH TWICE DAILY   Elastic Bandages & Supports (V-5 HIGH COMPRESSION HOSE) MISC 1pair, Wear them while driving, walking, standing   metFORMIN (GLUCOPHAGE) 1000 MG tablet Take 1 tablet (1,000 mg total) by mouth 2 (two) times daily with a meal. TAKE 1 TABLET BY MOUTH TWICE DAILY WITH A MEAL   metoprolol (TOPROL XL) 200 MG 24 hr tablet Take 1 tablet (200 mg total) by mouth daily.   montelukast (SINGULAIR) 10 MG tablet TAKE 1 TABLET(10 MG) BY MOUTH AT BEDTIME   Multiple Vitamin (ONE-A-DAY 55 PLUS PO) Take 1 tablet by mouth daily.   oxymetazoline (AFRIN) 0.05 % nasal spray Place 1 spray into both nostrils 2 (two) times daily as needed for congestion.   spironolactone (ALDACTONE) 25 MG tablet Take 1 tablet (25 mg total) by mouth daily.   tamsulosin (FLOMAX) 0.4 MG CAPS capsule TAKE 1 CAPSULE BY MOUTH DAILY AFTER BREAKFAST     Allergies:   Ace inhibitors and Angiotensin receptor blockers   Social History   Tobacco Use   Smoking status: Former Smoker    Packs/day: 0.50    Types: Cigarettes    Quit date: 06/10/2012    Years since quitting: 6.8   Smokeless tobacco: Never Used   Tobacco comment: given quit line info  Substance Use Topics   Alcohol use: Yes    Alcohol/week: 0.0 standard drinks    Comment: On holidays.   Drug use: No     Family Hx: The patient's family history includes Colon cancer in his maternal grandfather; Colon polyps in his maternal grandfather; Diabetes in his father; Heart disease in his father; Hypertension in his father and mother; Ovarian cancer in his maternal aunt.  ROS:   Please see the history of present illness.    General:no colds or fevers, + weight changes sown 9 labs Skin:no rashes or ulcers HEENT:no blurred vision, no congestion CV:see  HPI PUL:see HPI GI:no diarrhea constipation or melena, no indigestion GU:no hematuria, no dysuria MS:no joint pain, no claudication Neuro:no syncope, no lightheadedness Endo:+ diabetes, no thyroid disease  All other systems reviewed and are negative.   Prior CV studies:   The following studies were reviewed today:  ECHO 09/30/17:  - Left ventricle: Diffuse hypokinesis worse in the inferior wall. The cavity size was mildly dilated. Wall thickness was increased in a pattern of moderate LVH. Systolic function was moderately to severely reduced. The estimated ejection fraction was in the range of 30% to 35%. Left ventricular diastolic function parameters were normal. - Aortic valve: There was mild regurgitation. - Mitral valve: There was mild regurgitation. - Left atrium: The atrium was moderately dilated. - Right ventricle: The cavity size was mildly dilated. - Right atrium: The atrium was mildly dilated. - Atrial septum: No defect or patent foramen ovale was identified. - Pulmonary arteries: PA  peak pressure: 49 mm Hg (S).  Cath 12/16/17:  Mid LAD lesion is 10% stenosed.  LV end diastolic pressure is mildly elevated.  There is no aortic valve stenosis.  Hemodynamic findings consistent with moderate pulmonary hypertension.  No signficant CAD.  Continue medical therapy for nonischemic cardiomyopathy.  CO 7.7 L/min; CI 3.01; mean PA pressure 42 mm Hg; mean PCWP 24 mm Hg. Ao sat on RA 84%; PA sat 58%.    Labs/Other Tests and Data Reviewed:    EKG:  An ECG dated 12/04/17 was personally reviewed today and demonstrated:  SR with LAD RBBB and LVH  Recent Labs: 12/11/2018: BUN 26; Creatinine, Ser 2.33; Hemoglobin 11.2; Platelets 210; Potassium 4.5; Sodium 143   Recent Lipid Panel Lab Results  Component Value Date/Time   CHOL 136 12/11/2018 12:07 PM   TRIG 135 12/11/2018 12:07 PM   HDL 35 (L) 12/11/2018 12:07 PM   CHOLHDL 3.9 12/11/2018 12:07 PM   CHOLHDL 5.1  09/16/2013 02:39 PM   LDLCALC 74 12/11/2018 12:07 PM    Wt Readings from Last 3 Encounters:  04/02/19 (!) 334 lb 6.4 oz (151.7 kg)  01/01/19 (!) 342 lb (155.1 kg)  12/11/18 (!) 341 lb 11.2 oz (155 kg)     Objective:    Vital Signs:  BP (!) 128/92    Pulse 84    Temp (!) 96.7 F (35.9 C)    Ht 5\' 10"  (1.778 m)    Wt (!) 334 lb 6.4 oz (151.7 kg)    BMI 47.98 kg/m    VITAL SIGNS:  reviewed  General NAD Pulmonary can speak in complete sentences without SOB Neuro A&O X 3 answers questions appropriately Psych: pleasant affect   ASSESSMENT & PLAN:    1. Chronic systolic HF - EF , wt down 9 lbs with increase of torsemide and decreasing soup intake, discussed low salt diet and soup with high content of salt.  Need to check BMP - he may have done at PCP if not he needs BMP next week.   2. NICM see above 3. Htn controlled no change in meds  COVID-19 Education: The signs and symptoms of COVID-19 were discussed with the patient and how to seek care for testing (follow up with PCP or arrange E-visit).  The importance of social distancing was discussed today.  Time:   Today, I have spent 10 minutes with the patient with telehealth technology discussing the above problems.     Medication Adjustments/Labs and Tests Ordered: Current medicines are reviewed at length with the patient today.  Concerns regarding medicines are outlined above.   Tests Ordered: No orders of the defined types were placed in this encounter.   Medication Changes: Meds ordered this encounter  Medications   DISCONTD: torsemide (DEMADEX) 20 MG tablet    Sig: Take 2 tablets (40 mg total) by mouth 2 (two) times daily.    Dispense:  180 tablet    Refill:  3    Follow Up:  In Person in 3 month(s)  Signed, 32-99%, NP  04/03/2019 10:19 PM    [facility]

## 2019-04-02 NOTE — Telephone Encounter (Signed)
Pt may take 60 mg at evening alone or 40 in AM and 20 in pm per Dr. Marlou Porch. If pt having increased swelling or other problems he should be seen thanks.

## 2019-04-02 NOTE — Patient Instructions (Addendum)
Medication Instructions:  Your physician recommends that you continue on your current medications as directed. Please refer to the Current Medication list given to you today. I have sent in a refill for the Torsemide 20 mg taking 2 tablets twice a day  *If you need a refill on your cardiac medications before your next appointment, please call your pharmacy*  Lab Work: You will need to get a BMET drawn at your primary care's office. If not, please call us so you can come here to get it.  If you have labs (blood work) drawn today and your tests are completely normal, you will receive your results only by: Marland Kitchen MyChart Message (if you have MyChart) OR . A paper copy in the mail If you have any lab test that is abnormal or we need to change your treatment, we will call you to review the results.  Testing/Procedures: None odered  Follow-Up: At [facility], you and your health needs are our priority.  As part of our continuing mission to provide you with exceptional heart care, we have created designated Provider Care Teams.  These Care Teams include your primary Cardiologist (physician) and Advanced Practice Providers (APPs -  Physician Assistants and Nurse Practitioners) who all work together to provide you with the care you need, when you need it.  Your next appointment:   2 month(s)   06/05/2019 ARRIVE AT 1:05 FOR REGISTRATION  The format for your next appointment:   In Person  Provider:   Candee Furbish, MD  Other Instructions

## 2019-04-03 ENCOUNTER — Other Ambulatory Visit: Payer: Self-pay | Admitting: Internal Medicine

## 2019-04-03 MED ORDER — TORSEMIDE 20 MG PO TABS: 60.0000 mg | ORAL_TABLET | ORAL | 2 refills | Status: DC

## 2019-04-03 NOTE — Telephone Encounter (Signed)
Reordered new torsemide dose for patient. Thank you Dr. Marlou Porch for your recommendation.   Marianna Payment, DO

## 2019-04-09 ENCOUNTER — Ambulatory Visit: Payer: Medicaid Other | Admitting: Internal Medicine

## 2019-04-09 ENCOUNTER — Encounter: Payer: Self-pay | Admitting: Internal Medicine

## 2019-04-09 VITALS — BP 115/74 | HR 84 | Wt 340.8 lb

## 2019-04-09 DIAGNOSIS — I5020 Unspecified systolic (congestive) heart failure: Secondary | ICD-10-CM

## 2019-04-09 DIAGNOSIS — Z79899 Other long term (current) drug therapy: Secondary | ICD-10-CM | POA: Diagnosis not present

## 2019-04-09 NOTE — Progress Notes (Signed)
Internal Medicine Clinic Attending  Case discussed with Dr. Santos-Sanchez at the time of the visit.  We reviewed the resident's history and exam and pertinent patient test results.  I agree with the assessment, diagnosis, and plan of care documented in the resident's note.    

## 2019-04-09 NOTE — Progress Notes (Signed)
   CC: Heart failure follow-up  HPI:  [name] is a 63 y.o. year-old male with PMH listed below who presents to clinic for heart failure follow-up. Please see problem based assessment and plan for further details.   Past Medical History:  Diagnosis Date  . Allergic rhinitis   . Angioedema   . Bunion   . Diabetes mellitus   . History of hematuria 11/20/2007  . Hyperlipidemia   . Hypertension   . Hypokalemia   . Obesity   . OSA on CPAP   . Pericarditis 1988  . RBBB (right bundle branch block with left anterior fascicular block)   . Renal insufficiency    Cr 1.2 baseline  . Tobacco abuse    Stopped in 2002. Now smokes 1.5ppd  . Unspecified venous (peripheral) insufficiency    Review of Systems:   Review of Systems  Respiratory: Negative for cough and shortness of breath.   Cardiovascular: Positive for leg swelling. Negative for chest pain, palpitations, orthopnea and PND.    Physical Exam:  Vitals:   04/09/19 1011  BP: 115/74  Pulse: 84  SpO2: 100%  Weight: (!) 340 lb 12.8 oz (154.6 kg)    General: Chronically ill-appearing male, in wheelchair, in no acute distress Cardiac: regular rate and rhythm, nl S1/S2, no murmurs, rubs or gallops, no JVD  Pulm: CTAB, mild end expiratory wheezes, more work of breathing on 2L O2 Ext: warm and well perfused,1+ pitting edema bilaterally   Assessment & Plan:   See Encounters Tab for problem based charting.  Patient discussed with Dr. Dareen Piano

## 2019-04-09 NOTE — Assessment & Plan Note (Signed)
Patient has a history of HFrEF and presents for follow-up of LE swelling.  His torsemide dose has been adjusted a couple of times in the past 4 weeks.  He is currently taking torsemide 40 mg BID per instructions from his nephrologist.  Since starting this dose he believes his LE swelling has improved significantly.  He does not check his weight daily, does have a scale at home.  He denies chest pain, shortness of breath, orthopnea, and PND.  Appears euvolemic on exam. -Continue torsemide 40 mg twice daily -BMP to check renal function given recent increase in torsemide dose  - Discussed low salt diet and checking daily weights in the morning - Advised to call us or his cardiologist if weight goes up by 3 pounds in 1 day or 5 pounds in 1 week

## 2019-04-09 NOTE — Patient Instructions (Signed)
Mr. Bruntz,   Continue taking torsemide as you are. We will check yoru kidneys today and give you a call when we have the results.   Make sure to check your weight every day. If your weight goe sup by 3 pounds in one day or 5 pounds in 1 week call us or your heart doctor and let us know as we may have to adjust your torsemide dose.   Call us if you have any questions or concerns.   - Dr. Frederico Hamman

## 2019-04-10 LAB — BMP8+ANION GAP
Anion Gap: 16 mmol/L (ref 10.0–18.0)
BUN/Creatinine Ratio: 13 (ref 10–24)
BUN: 33 mg/dL — ABNORMAL HIGH (ref 8–27)
CO2: 27 mmol/L (ref 20–29)
Calcium: 10 mg/dL (ref 8.6–10.2)
Chloride: 101 mmol/L (ref 96–106)
Creatinine, Ser: 2.59 mg/dL — ABNORMAL HIGH (ref 0.76–1.27)
GFR calc Af Amer: 29 mL/min/{1.73_m2} — ABNORMAL LOW (ref >59)
GFR calc non Af Amer: 25 mL/min/{1.73_m2} — ABNORMAL LOW (ref >59)
Glucose: 229 mg/dL — ABNORMAL HIGH (ref 65–99)
Potassium: 4.3 mmol/L (ref 3.5–5.2)
Sodium: 144 mmol/L (ref 134–144)

## 2019-05-05 ENCOUNTER — Other Ambulatory Visit: Payer: Self-pay | Admitting: *Deleted

## 2019-05-07 MED ORDER — METFORMIN HCL 1000 MG PO TABS: 1000.0000 mg | ORAL_TABLET | Freq: Two times a day (BID) | ORAL | 1 refills | Status: DC

## 2019-05-08 NOTE — Addendum Note (Signed)
Addended by: Fredderick Severance on: 05/08/2019 10:48 AM   Modules accepted: Orders

## 2019-05-11 MED ORDER — METFORMIN HCL 1000 MG PO TABS: 1000.0000 mg | ORAL_TABLET | Freq: Two times a day (BID) | ORAL | 1 refills | Status: DC

## 2019-05-26 ENCOUNTER — Telehealth: Payer: Self-pay | Admitting: Cardiology

## 2019-05-26 NOTE — Telephone Encounter (Signed)
Ute from [facility] is sending a request over for medical records for this patient who is admitted. He is to have surgery tomorrow and they need medical records today. Called Medical Records but noone answered.

## 2019-05-27 ENCOUNTER — Telehealth: Payer: Self-pay | Admitting: Internal Medicine

## 2019-05-27 ENCOUNTER — Other Ambulatory Visit: Payer: Self-pay | Admitting: *Deleted

## 2019-05-27 DIAGNOSIS — R0902 Hypoxemia: Secondary | ICD-10-CM

## 2019-05-27 DIAGNOSIS — I5020 Unspecified systolic (congestive) heart failure: Secondary | ICD-10-CM

## 2019-05-27 DIAGNOSIS — J301 Allergic rhinitis due to pollen: Secondary | ICD-10-CM

## 2019-05-27 DIAGNOSIS — Z9981 Dependence on supplemental oxygen: Secondary | ICD-10-CM

## 2019-05-27 MED ORDER — BIDIL 20-37.5 MG PO TABS: 1.0000 | ORAL_TABLET | Freq: Two times a day (BID) | ORAL | 3 refills | Status: AC

## 2019-05-27 MED ORDER — METOPROLOL SUCCINATE ER 200 MG PO TB24: 200.0000 mg | ORAL_TABLET | Freq: Every day | ORAL | 3 refills | Status: AC

## 2019-05-27 MED ORDER — MONTELUKAST SODIUM 10 MG PO TABS: 10.0000 mg | ORAL_TABLET | Freq: Every day | ORAL | 3 refills | Status: AC

## 2019-05-27 MED ORDER — METFORMIN HCL 1000 MG PO TABS: 1000.0000 mg | ORAL_TABLET | Freq: Two times a day (BID) | ORAL | 1 refills | Status: AC

## 2019-05-27 MED ORDER — ATORVASTATIN CALCIUM 80 MG PO TABS: 80.0000 mg | ORAL_TABLET | Freq: Every day | ORAL | 3 refills | Status: AC

## 2019-05-27 MED ORDER — SPIRONOLACTONE 25 MG PO TABS: 25.0000 mg | ORAL_TABLET | Freq: Every day | ORAL | 3 refills | Status: AC

## 2019-05-27 MED ORDER — ALBUTEROL SULFATE HFA 108 (90 BASE) MCG/ACT IN AERS: 1.0000 | INHALATION_SPRAY | Freq: Four times a day (QID) | RESPIRATORY_TRACT | 3 refills | Status: AC | PRN

## 2019-05-27 MED ORDER — TORSEMIDE 20 MG PO TABS: 60.0000 mg | ORAL_TABLET | ORAL | 3 refills | Status: AC

## 2019-05-27 MED ORDER — TAMSULOSIN HCL 0.4 MG PO CAPS: 0.4000 mg | ORAL_CAPSULE | Freq: Every day | ORAL | 3 refills | Status: AC

## 2019-05-27 NOTE — Telephone Encounter (Signed)
Rec'd call from pt's Mother stating they have moved and are requesting allmedications be be sent to:  [facility].Address: [address] [place], [place] [address]  Phone: [phone]   albuterol (VENTOLIN HFA) 108 (90 Base) MCG/ACT inhaler    aspirin 81 MG tablet    atorvastatin (LIPITOR) 80 MG tablet    BIDIL 20-37.5 MG tablet    Elastic Bandages & Supports (V-5 HIGH COMPRESSION HOSE) MISC    metFORMIN (GLUCOPHAGE) 1000 MG tablet    metoprolol (TOPROL XL) 200 MG 24 hr tablet    montelukast (SINGULAIR) 10 MG tablet    Multiple Vitamin (ONE-A-DAY 55 PLUS PO)    oxymetazoline (AFRIN) 0.05 % nasal spray    spironolactone (ALDACTONE) 25 MG tablet    tamsulosin (FLOMAX) 0.4 MG CAPS capsule    torsemide (DEMADEX) 20 MG tablet

## 2019-05-27 NOTE — Telephone Encounter (Signed)
Started new encounter

## 2019-05-29 ENCOUNTER — Telehealth: Payer: Self-pay | Admitting: Internal Medicine

## 2019-05-29 NOTE — Telephone Encounter (Signed)
Pt calls to ask about why he was taken off glipizide and what his allergies are. He has moved and needs this, encouraged him to have new md to request records

## 2019-05-29 NOTE — Telephone Encounter (Signed)
Pls contact regarding medicine [phone]

## 2019-06-01 ENCOUNTER — Ambulatory Visit: Payer: Medicaid Other | Admitting: Podiatry

## 2019-06-03 ENCOUNTER — Telehealth: Payer: Self-pay | Admitting: Cardiology

## 2019-06-03 NOTE — Telephone Encounter (Signed)
Patient states he moved to [place] and he will be seeing [facility] from now on, but he appreciates everyone in this office for everything they have done for him.

## 2019-06-03 NOTE — Telephone Encounter (Signed)
Will send to Dr. Anne Fu as Lorain Childes.

## 2019-06-05 ENCOUNTER — Ambulatory Visit: Payer: Medicaid Other | Admitting: Cardiology

## 2019-07-09 ENCOUNTER — Encounter: Payer: Medicaid Other | Admitting: Internal Medicine

## 2020-06-21 ENCOUNTER — Other Ambulatory Visit: Payer: Self-pay | Admitting: Internal Medicine

## 2022-07-11 ENCOUNTER — Encounter: Payer: Self-pay | Admitting: Internal Medicine

## 2023-03-31 DEATH — deceased

## 2023-06-04 NOTE — Progress Notes (Signed)
 This encounter was created in error - please disregard.
